# Patient Record
Sex: Male | Born: 1965 | Race: White | Hispanic: No | Marital: Single
Health system: Midwestern US, Community
[De-identification: ages and names within clinical notes are randomized; demographics above are authoritative.]

## PROBLEM LIST (undated history)

## (undated) DIAGNOSIS — D649 Anemia, unspecified: Secondary | ICD-10-CM

## (undated) DIAGNOSIS — I509 Heart failure, unspecified: Secondary | ICD-10-CM

## (undated) DIAGNOSIS — K5090 Crohn's disease, unspecified, without complications: Secondary | ICD-10-CM

## (undated) DIAGNOSIS — E119 Type 2 diabetes mellitus without complications: Secondary | ICD-10-CM

## (undated) DIAGNOSIS — N189 Chronic kidney disease, unspecified: Secondary | ICD-10-CM

## (undated) DIAGNOSIS — G4730 Sleep apnea, unspecified: Secondary | ICD-10-CM

## (undated) DIAGNOSIS — G35D Multiple sclerosis, unspecified: Secondary | ICD-10-CM

## (undated) DIAGNOSIS — I10 Essential (primary) hypertension: Secondary | ICD-10-CM

## (undated) DIAGNOSIS — C801 Malignant (primary) neoplasm, unspecified: Secondary | ICD-10-CM

## (undated) DIAGNOSIS — J45909 Unspecified asthma, uncomplicated: Secondary | ICD-10-CM

## (undated) DIAGNOSIS — G35 Multiple sclerosis: Secondary | ICD-10-CM

## (undated) DIAGNOSIS — M069 Rheumatoid arthritis, unspecified: Secondary | ICD-10-CM

## (undated) DIAGNOSIS — M1990 Unspecified osteoarthritis, unspecified site: Secondary | ICD-10-CM

## (undated) HISTORY — PX: HERNIA REPAIR: SHX51

## (undated) HISTORY — PX: APPENDECTOMY: SHX54

## (undated) HISTORY — PX: SMALL BOWEL REPAIR: SHX6447

## (undated) HISTORY — PX: OTHER SURGICAL HISTORY: SHX169

---

## 2003-08-11 ENCOUNTER — Other Ambulatory Visit: Payer: Self-pay

## 2007-05-02 HISTORY — PX: HERNIA REPAIR: SHX51

## 2008-07-27 ENCOUNTER — Emergency Department: Payer: Self-pay | Admitting: Emergency Medicine

## 2011-07-17 LAB — COMPREHENSIVE METABOLIC PANEL
Albumin: 3.7 g/dL (ref 3.4–5.0)
Alkaline Phosphatase: 48 U/L — ABNORMAL LOW (ref 50–136)
Anion Gap: 16 (ref 7–16)
Calcium, Total: 9 mg/dL (ref 8.5–10.1)
Creatinine: 1.14 mg/dL (ref 0.60–1.30)
EGFR (Non-African Amer.): >60
Glucose: 104 mg/dL — ABNORMAL HIGH (ref 65–99)
Osmolality: 287 (ref 275–301)
Potassium: 3.8 mmol/L (ref 3.5–5.1)
SGPT (ALT): 40 U/L
Sodium: 143 mmol/L (ref 136–145)

## 2011-07-17 LAB — URINALYSIS, COMPLETE
Bilirubin,UR: NEGATIVE
Blood: NEGATIVE
Glucose,UR: NEGATIVE mg/dL (ref 0–75)
Leukocyte Esterase: NEGATIVE
Ph: 5 (ref 4.5–8.0)
Protein: 100
Specific Gravity: 1.04 (ref 1.003–1.030)
Squamous Epithelial: <1
WBC UR: 2 /HPF (ref 0–5)

## 2011-07-17 LAB — CBC
HCT: 43.8 % (ref 40.0–52.0)
MCH: 26.5 pg (ref 26.0–34.0)
Platelet: 296 10*3/uL (ref 150–440)
WBC: 15 10*3/uL — ABNORMAL HIGH (ref 3.8–10.6)

## 2011-07-17 LAB — LIPASE, BLOOD: Lipase: 199 U/L (ref 73–393)

## 2011-07-18 ENCOUNTER — Inpatient Hospital Stay: Payer: Self-pay | Admitting: Student

## 2011-07-19 LAB — BASIC METABOLIC PANEL
Anion Gap: 13 (ref 7–16)
Calcium, Total: 8.7 mg/dL (ref 8.5–10.1)
Chloride: 104 mmol/L (ref 98–107)
Co2: 24 mmol/L (ref 21–32)
Creatinine: 0.78 mg/dL (ref 0.60–1.30)
EGFR (African American): >60
Osmolality: 284 (ref 275–301)
Sodium: 141 mmol/L (ref 136–145)

## 2011-07-19 LAB — CBC WITH DIFFERENTIAL/PLATELET
Basophil %: 0.2 %
Eosinophil #: 0 10*3/uL (ref 0.0–0.7)
Eosinophil %: 0 %
HCT: 40.6 % (ref 40.0–52.0)
HGB: 13.4 g/dL (ref 13.0–18.0)
Lymphocyte %: 9.9 %
MCHC: 33.1 g/dL (ref 32.0–36.0)
Monocyte %: 5.3 %
Neutrophil #: 10.6 10*3/uL — ABNORMAL HIGH (ref 1.4–6.5)
Neutrophil %: 84.6 %
RBC: 5.07 10*6/uL (ref 4.40–5.90)
WBC: 12.5 10*3/uL — ABNORMAL HIGH (ref 3.8–10.6)

## 2011-07-19 LAB — CLOSTRIDIUM DIFFICILE BY PCR

## 2011-07-19 LAB — WBCS, STOOL

## 2011-07-20 LAB — CBC WITH DIFFERENTIAL/PLATELET
Basophil #: 0 10*3/uL (ref 0.0–0.1)
Basophil %: 0.2 %
Eosinophil %: 0 %
HCT: 38.9 % — ABNORMAL LOW (ref 40.0–52.0)
HGB: 12.9 g/dL — ABNORMAL LOW (ref 13.0–18.0)
Lymphocyte #: 1.1 10*3/uL (ref 1.0–3.6)
Lymphocyte %: 9.7 %
MCH: 26.5 pg (ref 26.0–34.0)
MCHC: 33.1 g/dL (ref 32.0–36.0)
MCV: 80 fL (ref 80–100)
Monocyte #: 0.5 10*3/uL (ref 0.0–0.7)
Monocyte %: 4.5 %
Neutrophil #: 9.8 10*3/uL — ABNORMAL HIGH (ref 1.4–6.5)
RBC: 4.87 10*6/uL (ref 4.40–5.90)
WBC: 11.5 10*3/uL — ABNORMAL HIGH (ref 3.8–10.6)

## 2011-07-23 ENCOUNTER — Emergency Department: Payer: Self-pay | Admitting: Unknown Physician Specialty

## 2011-07-23 LAB — URINALYSIS, COMPLETE
Blood: NEGATIVE
Ketone: NEGATIVE
Nitrite: NEGATIVE
Protein: NEGATIVE
Squamous Epithelial: <1

## 2011-07-23 LAB — COMPREHENSIVE METABOLIC PANEL
Albumin: 3.4 g/dL (ref 3.4–5.0)
Anion Gap: 12 (ref 7–16)
BUN: 17 mg/dL (ref 7–18)
Calcium, Total: 8.4 mg/dL — ABNORMAL LOW (ref 8.5–10.1)
Co2: 25 mmol/L (ref 21–32)
EGFR (Non-African Amer.): >60
Glucose: 112 mg/dL — ABNORMAL HIGH (ref 65–99)
Osmolality: 289 (ref 275–301)
Potassium: 4.2 mmol/L (ref 3.5–5.1)
SGOT(AST): 35 U/L (ref 15–37)
SGPT (ALT): 58 U/L
Total Protein: 6.8 g/dL (ref 6.4–8.2)

## 2011-07-23 LAB — CBC
HCT: 42.9 % (ref 40.0–52.0)
HGB: 14.4 g/dL (ref 13.0–18.0)
MCH: 26.7 pg (ref 26.0–34.0)
MCHC: 33.4 g/dL (ref 32.0–36.0)
MCV: 80 fL (ref 80–100)
RDW: 16.3 % — ABNORMAL HIGH (ref 11.5–14.5)

## 2011-07-23 LAB — LIPASE, BLOOD: Lipase: 345 U/L (ref 73–393)

## 2011-07-24 ENCOUNTER — Emergency Department: Payer: Self-pay | Admitting: *Deleted

## 2011-07-24 LAB — CBC
HCT: 44.6 % (ref 40.0–52.0)
MCH: 26.5 pg (ref 26.0–34.0)
MCHC: 32.9 g/dL (ref 32.0–36.0)
MCV: 81 fL (ref 80–100)
RBC: 5.55 10*6/uL (ref 4.40–5.90)
WBC: 14.8 10*3/uL — ABNORMAL HIGH (ref 3.8–10.6)

## 2011-07-24 LAB — COMPREHENSIVE METABOLIC PANEL
Albumin: 3.2 g/dL — ABNORMAL LOW (ref 3.4–5.0)
BUN: 19 mg/dL — ABNORMAL HIGH (ref 7–18)
Bilirubin,Total: 0.4 mg/dL (ref 0.2–1.0)
Calcium, Total: 8.4 mg/dL — ABNORMAL LOW (ref 8.5–10.1)
Chloride: 107 mmol/L (ref 98–107)
Co2: 26 mmol/L (ref 21–32)
EGFR (Non-African Amer.): >60
Glucose: 112 mg/dL — ABNORMAL HIGH (ref 65–99)
Osmolality: 288 (ref 275–301)
Potassium: 3.5 mmol/L (ref 3.5–5.1)
SGOT(AST): 24 U/L (ref 15–37)
SGPT (ALT): 57 U/L
Total Protein: 6.3 g/dL — ABNORMAL LOW (ref 6.4–8.2)

## 2011-07-24 LAB — LIPASE, BLOOD: Lipase: 320 U/L (ref 73–393)

## 2014-02-19 LAB — CBC WITH DIFFERENTIAL/PLATELET
Basophil #: 0.1 10*3/uL (ref 0.0–0.1)
Basophil %: 0.7 %
EOS ABS: 0.3 10*3/uL (ref 0.0–0.7)
Eosinophil %: 2.8 %
HCT: 41.8 % (ref 40.0–52.0)
HGB: 13.6 g/dL (ref 13.0–18.0)
Lymphocyte #: 1.8 10*3/uL (ref 1.0–3.6)
Lymphocyte %: 19 %
MCH: 25.3 pg — AB (ref 26.0–34.0)
MCHC: 32.4 g/dL (ref 32.0–36.0)
MCV: 78 fL — AB (ref 80–100)
Monocyte #: 0.6 x10 3/mm (ref 0.2–1.0)
Monocyte %: 6.6 %
NEUTROS PCT: 70.9 %
Neutrophil #: 6.9 10*3/uL — ABNORMAL HIGH (ref 1.4–6.5)
Platelet: 208 10*3/uL (ref 150–440)
RBC: 5.36 10*6/uL (ref 4.40–5.90)
RDW: 17.3 % — AB (ref 11.5–14.5)
WBC: 9.8 10*3/uL (ref 3.8–10.6)

## 2014-02-19 LAB — COMPREHENSIVE METABOLIC PANEL
ALK PHOS: 48 U/L
AST: 14 U/L — AB (ref 15–37)
Albumin: 3.3 g/dL — ABNORMAL LOW (ref 3.4–5.0)
Anion Gap: 8 (ref 7–16)
BUN: 18 mg/dL (ref 7–18)
Bilirubin,Total: 0.2 mg/dL (ref 0.2–1.0)
CO2: 25 mmol/L (ref 21–32)
Calcium, Total: 8.9 mg/dL (ref 8.5–10.1)
Chloride: 109 mmol/L — ABNORMAL HIGH (ref 98–107)
Creatinine: 1.13 mg/dL (ref 0.60–1.30)
Glucose: 114 mg/dL — ABNORMAL HIGH (ref 65–99)
Osmolality: 286 (ref 275–301)
Potassium: 3.8 mmol/L (ref 3.5–5.1)
SGPT (ALT): 25 U/L
Sodium: 142 mmol/L (ref 136–145)
Total Protein: 6.6 g/dL (ref 6.4–8.2)

## 2014-02-19 LAB — TROPONIN I: Troponin-I: <0.02 ng/mL

## 2014-02-19 LAB — LIPASE, BLOOD: LIPASE: 335 U/L (ref 73–393)

## 2014-02-20 ENCOUNTER — Observation Stay: Payer: Self-pay | Admitting: Internal Medicine

## 2014-02-21 LAB — CBC WITH DIFFERENTIAL/PLATELET
Basophil #: 0.1 10*3/uL (ref 0.0–0.1)
Basophil %: 1 %
Eosinophil #: 0.1 10*3/uL (ref 0.0–0.7)
Eosinophil %: 0.9 %
HCT: 40.2 % (ref 40.0–52.0)
HGB: 12.7 g/dL — ABNORMAL LOW (ref 13.0–18.0)
Lymphocyte #: 1.5 10*3/uL (ref 1.0–3.6)
Lymphocyte %: 19.8 %
MCH: 25.2 pg — ABNORMAL LOW (ref 26.0–34.0)
MCHC: 31.6 g/dL — ABNORMAL LOW (ref 32.0–36.0)
MCV: 80 fL (ref 80–100)
Monocyte #: 0.6 x10 3/mm (ref 0.2–1.0)
Monocyte %: 7.8 %
Neutrophil #: 5.3 10*3/uL (ref 1.4–6.5)
Neutrophil %: 70.5 %
Platelet: 181 10*3/uL (ref 150–440)
RBC: 5.04 10*6/uL (ref 4.40–5.90)
RDW: 17.2 % — ABNORMAL HIGH (ref 11.5–14.5)
WBC: 7.5 10*3/uL (ref 3.8–10.6)

## 2014-02-21 LAB — TSH: Thyroid Stimulating Horm: 1.17 u[IU]/mL

## 2014-04-02 ENCOUNTER — Ambulatory Visit: Payer: Self-pay | Admitting: Neurology

## 2014-04-06 ENCOUNTER — Ambulatory Visit: Payer: Self-pay | Admitting: Neurology

## 2014-04-07 ENCOUNTER — Observation Stay: Payer: Self-pay | Admitting: Specialist

## 2014-04-07 LAB — COMPREHENSIVE METABOLIC PANEL
ALT: 24 U/L
ANION GAP: 8 (ref 7–16)
Albumin: 3.2 g/dL — ABNORMAL LOW (ref 3.4–5.0)
Alkaline Phosphatase: 39 U/L — ABNORMAL LOW
BUN: 14 mg/dL (ref 7–18)
Bilirubin,Total: 0.3 mg/dL (ref 0.2–1.0)
Calcium, Total: 8.5 mg/dL (ref 8.5–10.1)
Chloride: 105 mmol/L (ref 98–107)
Co2: 26 mmol/L (ref 21–32)
Creatinine: 1.06 mg/dL (ref 0.60–1.30)
EGFR (African American): >60
Glucose: 88 mg/dL (ref 65–99)
OSMOLALITY: 277 (ref 275–301)
POTASSIUM: 3.5 mmol/L (ref 3.5–5.1)
SGOT(AST): 14 U/L — ABNORMAL LOW (ref 15–37)
SODIUM: 139 mmol/L (ref 136–145)
Total Protein: 6.4 g/dL (ref 6.4–8.2)

## 2014-04-07 LAB — CBC WITH DIFFERENTIAL/PLATELET
Basophil #: 0.1 10*3/uL (ref 0.0–0.1)
Basophil %: 0.7 %
EOS ABS: 0.1 10*3/uL (ref 0.0–0.7)
EOS PCT: 2 %
HCT: 40.6 % (ref 40.0–52.0)
HGB: 13.5 g/dL (ref 13.0–18.0)
LYMPHS ABS: 1.5 10*3/uL (ref 1.0–3.6)
Lymphocyte %: 20.2 %
MCH: 27.1 pg (ref 26.0–34.0)
MCHC: 33.2 g/dL (ref 32.0–36.0)
MCV: 82 fL (ref 80–100)
MONOS PCT: 9.6 %
Monocyte #: 0.7 x10 3/mm (ref 0.2–1.0)
NEUTROS PCT: 67.5 %
Neutrophil #: 4.9 10*3/uL (ref 1.4–6.5)
Platelet: 184 10*3/uL (ref 150–440)
RBC: 4.97 10*6/uL (ref 4.40–5.90)
RDW: 16.4 % — AB (ref 11.5–14.5)
WBC: 7.2 10*3/uL (ref 3.8–10.6)

## 2014-04-07 LAB — URINALYSIS, COMPLETE
BACTERIA: NONE SEEN
Bilirubin,UR: NEGATIVE
Blood: NEGATIVE
Glucose,UR: NEGATIVE mg/dL (ref 0–75)
Ketone: NEGATIVE
Leukocyte Esterase: NEGATIVE
Nitrite: NEGATIVE
Ph: 6 (ref 4.5–8.0)
Protein: NEGATIVE
RBC,UR: 1 /HPF (ref 0–5)
SPECIFIC GRAVITY: 1.01 (ref 1.003–1.030)
Squamous Epithelial: NONE SEEN
WBC UR: NONE SEEN /HPF (ref 0–5)

## 2014-04-07 LAB — PROTIME-INR
INR: 1
Prothrombin Time: 12.7 secs (ref 11.5–14.7)

## 2014-04-07 LAB — APTT: Activated PTT: 33.7 secs (ref 23.6–35.9)

## 2014-04-07 LAB — LIPASE, BLOOD: Lipase: 197 U/L (ref 73–393)

## 2014-04-08 LAB — CBC WITH DIFFERENTIAL/PLATELET
BASOS ABS: 0 10*3/uL (ref 0.0–0.1)
Basophil %: 0.4 %
EOS PCT: 0.7 %
Eosinophil #: 0.1 10*3/uL (ref 0.0–0.7)
HCT: 42.8 % (ref 40.0–52.0)
HGB: 14 g/dL (ref 13.0–18.0)
Lymphocyte #: 1.3 10*3/uL (ref 1.0–3.6)
Lymphocyte %: 15 %
MCH: 27.1 pg (ref 26.0–34.0)
MCHC: 32.7 g/dL (ref 32.0–36.0)
MCV: 83 fL (ref 80–100)
Monocyte #: 0.8 x10 3/mm (ref 0.2–1.0)
Monocyte %: 9 %
Neutrophil #: 6.6 10*3/uL — ABNORMAL HIGH (ref 1.4–6.5)
Neutrophil %: 74.9 %
PLATELETS: 176 10*3/uL (ref 150–440)
RBC: 5.16 10*6/uL (ref 4.40–5.90)
RDW: 16.5 % — AB (ref 11.5–14.5)
WBC: 8.8 10*3/uL (ref 3.8–10.6)

## 2014-04-08 LAB — COMPREHENSIVE METABOLIC PANEL
ANION GAP: 6 — AB (ref 7–16)
Albumin: 3.1 g/dL — ABNORMAL LOW (ref 3.4–5.0)
Alkaline Phosphatase: 38 U/L — ABNORMAL LOW
BILIRUBIN TOTAL: 0.5 mg/dL (ref 0.2–1.0)
BUN: 14 mg/dL (ref 7–18)
CALCIUM: 8.6 mg/dL (ref 8.5–10.1)
CO2: 22 mmol/L (ref 21–32)
CREATININE: 0.89 mg/dL (ref 0.60–1.30)
Chloride: 108 mmol/L — ABNORMAL HIGH (ref 98–107)
Glucose: 121 mg/dL — ABNORMAL HIGH (ref 65–99)
Osmolality: 274 (ref 275–301)
Potassium: 4.9 mmol/L (ref 3.5–5.1)
SGOT(AST): 16 U/L (ref 15–37)
SGPT (ALT): 26 U/L
Sodium: 136 mmol/L (ref 136–145)
Total Protein: 6.4 g/dL (ref 6.4–8.2)

## 2014-04-09 LAB — CBC WITH DIFFERENTIAL/PLATELET
BASOS PCT: 0.2 %
Basophil #: 0 10*3/uL (ref 0.0–0.1)
EOS ABS: 0 10*3/uL (ref 0.0–0.7)
EOS PCT: 0.2 %
HCT: 40.4 % (ref 40.0–52.0)
HGB: 13.3 g/dL (ref 13.0–18.0)
LYMPHS PCT: 8.2 %
Lymphocyte #: 0.6 10*3/uL — ABNORMAL LOW (ref 1.0–3.6)
MCH: 26.7 pg (ref 26.0–34.0)
MCHC: 32.8 g/dL (ref 32.0–36.0)
MCV: 81 fL (ref 80–100)
Monocyte #: 0.2 x10 3/mm (ref 0.2–1.0)
Monocyte %: 2.9 %
Neutrophil #: 6.9 10*3/uL — ABNORMAL HIGH (ref 1.4–6.5)
Neutrophil %: 88.5 %
Platelet: 175 10*3/uL (ref 150–440)
RBC: 4.97 10*6/uL (ref 4.40–5.90)
RDW: 16.4 % — ABNORMAL HIGH (ref 11.5–14.5)
WBC: 7.8 10*3/uL (ref 3.8–10.6)

## 2014-05-06 ENCOUNTER — Emergency Department: Payer: Self-pay | Admitting: Emergency Medicine

## 2014-05-06 LAB — COMPREHENSIVE METABOLIC PANEL
ALK PHOS: 40 U/L — AB
Albumin: 3.2 g/dL — ABNORMAL LOW (ref 3.4–5.0)
Anion Gap: 5 — ABNORMAL LOW (ref 7–16)
BUN: 11 mg/dL (ref 7–18)
Bilirubin,Total: 0.2 mg/dL (ref 0.2–1.0)
Calcium, Total: 8.3 mg/dL — ABNORMAL LOW (ref 8.5–10.1)
Chloride: 107 mmol/L (ref 98–107)
Co2: 27 mmol/L (ref 21–32)
Creatinine: 1.21 mg/dL (ref 0.60–1.30)
EGFR (African American): >60
EGFR (Non-African Amer.): >60
GLUCOSE: 78 mg/dL (ref 65–99)
Osmolality: 276 (ref 275–301)
POTASSIUM: 4 mmol/L (ref 3.5–5.1)
SGOT(AST): 25 U/L (ref 15–37)
SGPT (ALT): 39 U/L
Sodium: 139 mmol/L (ref 136–145)
TOTAL PROTEIN: 6.4 g/dL (ref 6.4–8.2)

## 2014-05-06 LAB — CBC WITH DIFFERENTIAL/PLATELET
Basophil #: 0.1 10*3/uL (ref 0.0–0.1)
Basophil %: 0.7 %
Eosinophil #: 0.3 10*3/uL (ref 0.0–0.7)
Eosinophil %: 2.1 %
HCT: 41 % (ref 40.0–52.0)
HGB: 13.4 g/dL (ref 13.0–18.0)
LYMPHS ABS: 1.9 10*3/uL (ref 1.0–3.6)
Lymphocyte %: 15.8 %
MCH: 26.7 pg (ref 26.0–34.0)
MCHC: 32.7 g/dL (ref 32.0–36.0)
MCV: 82 fL (ref 80–100)
MONO ABS: 1.1 x10 3/mm — AB (ref 0.2–1.0)
MONOS PCT: 9 %
Neutrophil #: 8.7 10*3/uL — ABNORMAL HIGH (ref 1.4–6.5)
Neutrophil %: 72.4 %
PLATELETS: 238 10*3/uL (ref 150–440)
RBC: 5.02 10*6/uL (ref 4.40–5.90)
RDW: 15.3 % — ABNORMAL HIGH (ref 11.5–14.5)
WBC: 12 10*3/uL — ABNORMAL HIGH (ref 3.8–10.6)

## 2014-05-26 ENCOUNTER — Emergency Department: Payer: Self-pay | Admitting: Emergency Medicine

## 2014-05-26 LAB — COMPREHENSIVE METABOLIC PANEL
ALBUMIN: 2.9 g/dL — AB (ref 3.4–5.0)
ALK PHOS: 40 U/L — AB (ref 46–116)
ANION GAP: 5 — AB (ref 7–16)
BUN: 16 mg/dL (ref 7–18)
Bilirubin,Total: 0.2 mg/dL (ref 0.2–1.0)
CO2: 24 mmol/L (ref 21–32)
Calcium, Total: 8.5 mg/dL (ref 8.5–10.1)
Chloride: 111 mmol/L — ABNORMAL HIGH (ref 98–107)
Creatinine: 1.33 mg/dL — ABNORMAL HIGH (ref 0.60–1.30)
EGFR (African American): >60
EGFR (Non-African Amer.): >60
GLUCOSE: 91 mg/dL (ref 65–99)
OSMOLALITY: 280 (ref 275–301)
POTASSIUM: 3.8 mmol/L (ref 3.5–5.1)
SGOT(AST): 17 U/L (ref 15–37)
SGPT (ALT): 19 U/L (ref 14–63)
SODIUM: 140 mmol/L (ref 136–145)
Total Protein: 5.9 g/dL — ABNORMAL LOW (ref 6.4–8.2)

## 2014-05-26 LAB — URINALYSIS, COMPLETE
BACTERIA: NONE SEEN
BILIRUBIN, UR: NEGATIVE
BLOOD: NEGATIVE
Glucose,UR: NEGATIVE mg/dL (ref 0–75)
Hyaline Cast: 2
KETONE: NEGATIVE
Leukocyte Esterase: NEGATIVE
Nitrite: NEGATIVE
PROTEIN: NEGATIVE
Ph: 6 (ref 4.5–8.0)
SQUAMOUS EPITHELIAL: NONE SEEN
Specific Gravity: 1.023 (ref 1.003–1.030)

## 2014-05-26 LAB — CBC
HCT: 40.8 % (ref 40.0–52.0)
HGB: 13.3 g/dL (ref 13.0–18.0)
MCH: 26.5 pg (ref 26.0–34.0)
MCHC: 32.4 g/dL (ref 32.0–36.0)
MCV: 82 fL (ref 80–100)
PLATELETS: 187 10*3/uL (ref 150–440)
RBC: 5.01 10*6/uL (ref 4.40–5.90)
RDW: 14.8 % — ABNORMAL HIGH (ref 11.5–14.5)
WBC: 8.8 10*3/uL (ref 3.8–10.6)

## 2014-05-26 LAB — LIPASE, BLOOD: Lipase: 227 U/L (ref 73–393)

## 2014-07-30 ENCOUNTER — Ambulatory Visit
Admit: 2014-07-30 | Disposition: A | Discharge status: Home / Self Care | Payer: Self-pay | Attending: Nephrology | Admitting: Nephrology

## 2014-08-22 NOTE — Discharge Summary (Signed)
PATIENT NAME:  Walter Diaz, Walter Diaz MR#:  [id number] DATE OF BIRTH:  12-24-1965  DATE OF ADMISSION:  02/20/2014 DATE OF DISCHARGE:  02/21/2014  DISCHARGE DIAGNOSES: 1.  Crohn's disease flare.  2.  Chronic pain syndrome.  3.  Depression.   DISCHARGE MEDICATIONS:  1.  Phenergan 25 mg orally 4 times a day as needed.  2.  Copaxone 40 mg per mL subcutaneous on Monday, Wednesday, Friday.  3.  OxyContin 20 mg oral every 12 hours.  4.  Oxycodone 20 mg every 6 hours as needed for pain.  5.  Lyrica 75 mg oral 2 times a day.  6.  Nexium 40 mg oral once a day.  7.  Adderall 10 mg oral 2 times a day.  8.  Prozac 20 mg oral 2 times a day.  9.  Entyvio 300 mg intravenous once a month.  10.  Percocet 10/325 one tablet every 4 hours as needed for pain.  11  Prednisone 40 mg daily for a week and then tapered over the next 3 weeks.  12.  Ciprofloxacin 5 mg oral 2 times a day for 1 week.  13.  Flagyl 500 mg oral 3 times a day for 1 week.   DISCHARGE INSTRUCTIONS:  Regular diet. Activity as tolerated. Follow up with [facility], GI, in 1-2 days where he has an appointment.  IMAGING STUDIES DONE: Include a CT scan of the abdomen and pelvis with contrast, showed mild bowel wall thickening and adjacent mesenteric stranding of the distal small bowel, compatible with mild inflammatory bowel disease flare. Fluid throughout the colon.  No constipation, no obstruction.    CONSULTATIONS:  Arther Dames, MD, with GI.  ADMITTING HISTORY AND PHYSICAL: Please see detailed H and P dictated previously by Dr. Marcille Blanco. In brief, a 49 year old Caucasian male patient with a long history of Crohn's disease, who follows at [facility] with the Inflammatory Bowel Disease Clinic. Presented to the Emergency Room complaining of worsening abdominal pain and vomiting. The patient was admitted for the treatment of Crohn's disease flare.   HOSPITAL COURSE: The patient was started on prednisone, initially n.p.o., later  transitioned to full diet. Was seen by GI who suggested tapering off prednisone slowly over a month. The patient is to follow up with [place] from where he will be referred to either [facility] or [facility] per Dr. Rayann Heman of GI.   The patient on the day of discharge has tolerated his food well. No diarrhea. No blood. Feels pain is well controlled with Percocet and is being discharged home in a stable condition. Prior to discharge, the patient has no tenderness on abdominal examination, bowel sounds present.  Lungs sound clear. S1, S2 heard. The patient's other comorbidities have been stable during the hospital stay.   TIME SPENT ON DAY OF DISCHARGE IN DISCHARGE ACTIVITY: 40 minutes.    ____________________________ Walter Alf Jaria Conway, MD srs:LT D: 02/23/2014 13:47:55 ET T: 02/23/2014 21:52:02 ET JOB#: [id number]  cc: Alveta Heimlich R. Yohann Curl, MD, <Dictator> Neita Carp MD ELECTRONICALLY SIGNED 03/05/2014 14:55

## 2014-08-22 NOTE — Consult Note (Signed)
PATIENT NAME:  Walter Diaz, Walter Diaz MR#:  [id number] DATE OF BIRTH:  14-Apr-1966  DATE OF CONSULTATION:  02/20/2014  REFERRING PHYSICIAN:  Rosilyn Mings, MD  CONSULTING PHYSICIAN:  Arther Dames, MD  REASON FOR CONSULTATION: Crohn's flare.   HISTORY OF PRESENT ILLNESS: Walter Diaz is a 49 year old male with a very complicated Crohn's history, including multiple resections, also what sounds like a demyelinating reaction to Humira and recent failure to respond to natalizumab, who is presenting to the Emergency Room for evaluation of diarrhea and abdominal pain.   Walter Diaz has long-standing history of Crohn's disease and was previously followed by Dr. Phineas Douglas at the [facility]. It sounds as though he has failed multiple therapies and also has had a recent colonoscopy in [facility] showing active disease in his colon and small intestine. This is per the patient as I do not have records this time.   He reports that he always has trouble with abdominal pain but over the past several days, had some worsening in his frequency of stool and also had increasing abdominal pain. He also had some subjective fevers.   In the Emergency Room, he had a CT scan which did show some distal small bowel thickening and stranding near his anastomosis. He also had evidence of some thickening and inflammation in his distal rectum.   Since he has been in the hospital he has received prednisone and antibiotics and actually is feeling significantly improved at this time.   PAST MEDICAL HISTORY:  1. Crohn's disease, status post multiple small bowel resections.  2. Multiple sclerosis.  3. Rheumatoid arthritis.  4. Obstructive sleep apnea.  5. GERD.   PAST SURGICAL HISTORY: Multiple small bowel resections. He also apparently had a surgical repair of a Humira injection site.   FAMILY HISTORY: He does report multiple family members with Crohn's disease and also a father with colon cancer.   SOCIAL HISTORY:  He denies alcohol or tobacco to me.   MEDICATIONS:  1. Percocet 5/325 1 tab every 6 hours.  2. Phenergan 25 mg as needed.  3. Tysabri (has not been taking recently).   ALLERGIES: MORPHINE.   PHYSICAL EXAMINATION:  VITAL SIGNS: Temperature is 98.1, pulse is 63, respirations are 17, blood pressure 142/91. Pulse oximetry is 97% on room air.  GENERAL: Alert and oriented times 4.  No acute distress. Appears stated age. HEENT: Normocephalic/atraumatic. Extraocular movements are intact. Anicteric. NECK: Soft, supple. JVP appears normal. No adenopathy. CHEST: Clear to auscultation. No wheeze or crackle. Respirations unlabored. HEART: Regular. No murmur, rub, or gallop.  Normal S1 and S2. ABDOMEN: Positive for multiple, multiple surgical scars.  He has some mild diffuse tenderness. Soft, nontender, nondistended.  Normal active bowel sounds in all 4 quadrants.  No organomegaly. No masses EXTREMITIES: No swelling, well perfused. SKIN: No rash or lesion. Skin color, texture, turgor normal. NEUROLOGICAL: Grossly intact. PSYCHIATRIC: Normal tone and affect. MUSCULOSKELETAL: No joint swelling or erythema.   LABORATORY DATA: His sodium is 142, potassium 3.8, BUN 18, creatinine 1.13. His lipase is 355, which is within normal limits. Liver enzymes are normal. His albumin is 3.3. Troponin is normal. His white count is 9.8, hemoglobin 13.6, hematocrit 42, platelets are 208,000.   For CT scan, see history of present illness.   ASSESSMENT AND PLAN:  Crohn's flare: He does have a very complicated Crohn's history, including multiple resections and also failure on multiple biologics. He was followed at [facility] with one of the leading IBD specialists, Dr. Lesly Rubenstein  Shen. Fortunately, he is doing better in the hospital on antibiotics and prednisone.   RECOMMENDATIONS:  1. It is okay to complete a course of Cipro and Flagyl.  2. Continue an extended prednisone taper until he is able to follow up with a  gastroenterologist as an outpatient. I would recommend starting this taper at 40 mg and extending a taper for over a month.  3. He has a scheduled follow up appointment at [facility]. However, given the severe complexity of his disease I think he would be much better served at an academic medical center. I recommended that he be seen in the IBD clinic at [facility]. He is in agreement with this recommendation. We will work on establishing this follow-up for him given his medical complexity and failure with multiple biologics to this point.   Thank you for this consult.    ____________________________ Arther Dames, MD mr:JT D: 02/20/2014 16:34:36 ET T: 02/20/2014 23:13:47 ET JOB#: [id number]  cc: Arther Dames, MD, <Dictator> Mellody Life MD ELECTRONICALLY SIGNED 03/24/2014 15:20

## 2014-08-22 NOTE — Discharge Summary (Signed)
PATIENT NAME:  Walter Diaz, Walter Diaz MR#:  [id number] DATE OF BIRTH:  [birth year]  DATE OF ADMISSION:  04/07/2014 DATE OF DISCHARGE:  04/09/2014  For a detailed note, please see the history and physical done on admission by Dr. Valentino Nose.   DIAGNOSES AT DISCHARGE: 1.  Suspected Crohn disease flare, right upper quadrant abdominal pain due to Crohn disease flare.  2.  History of multiple sclerosis, depression.   DIET: The patient is being discharged on a regular diet.   ACTIVITY: As tolerated.  FOLLOWUP: Followup is with Dr. Ellison Hughs in the next 1 to 2 weeks. Also follow up with the inflammatory bowel disease clinic at [facility] on December 21.   DISCHARGE MEDICATIONS: Phenergan 25 mg as needed for nausea and vomiting; Lyrica 75 mg b.i.d.; Nexium 40 mg daily; Adderall 10 mg b.i.d.; Prozac 20 mg b.i.d.;  Robitussin 10 mL q. 4 hours as needed; Copaxone 40 mg subcutaneous 3 times weekly; prednisone taper starting at 40 mg for a week, then 30 mg for a week, then 20 mg for a week, and then 10 mg for a week; Tylenol with oxycodone 5/325, 1 tab q. 4 hours as needed for pain; ciprofloxacin 250 mg b.i.d. x 7 days; and Flagyl 5 mg q. 8 hours x 7 days.   CONSULTANTS DURING HOSPITAL COURSE: Dr. Dorise Bullion from gastroenterology.   PERTINENT STUDIES DONE DURING THE HOSPITAL COURSE: Abdominal 3-way done on admission showing nonobstructive bowel gas pattern. No free air.   BRIEF HOSPITAL COURSE: This is a 49 year old male with medical problems as mentioned above who presented to the hospital due to right upper quadrant abdominal pain and some bloody diarrhea.  1.  Right upper quadrant abdominal pain with hematochezia. This was suspected to be secondary to a Crohn disease flare. The patient was empirically started on IV antibiotics with Cipro, Flagyl; placed on a clear liquid diet. A gastroenterology consult was obtained. The patient was seen by them, started on some IV steroids. After getting some steroids and some IV  antibiotics for about 24 to 48 hours, his clinical symptoms have improved. He has had no further bloody diarrhea. His abdominal pain is improved and resolved. Patient presently therefore is being discharged on a long prednisone taper along with oral antibiotics and to follow up at the inflammatory bowel disease clinic at [facility] coming up later this month.  2.  History of relapsing multiple sclerosis. The patient was maintained on his Copaxone. He will resume that.  3.  GERD. The patient was maintained on Protonix. He will resume that. 4.  Neuropathy. The patient was maintained on his Lyrica. He will resume that.  5.  A history of depression. The patient was maintained on his Prozac and he will also resume that upon discharge.   CODE STATUS: The patient is a full code.   TIME SPENT: 40 minutes.   ____________________________ Belia Heman. Verdell Carmine, MD vjs:ST D: 04/09/2014 15:51:00 ET T: 04/10/2014 02:55:56 ET JOB#: [id number]  cc: Belia Heman. Verdell Carmine, MD, <Dictator> Henreitta Leber MD ELECTRONICALLY SIGNED 04/17/2014 10:46

## 2014-08-22 NOTE — H&P (Signed)
PATIENT NAME:  Walter Diaz, Walter Diaz MR#:  [id number] DATE OF BIRTH:  October 14, 1965  DATE OF ADMISSION:  02/20/2014  REFERRING PHYSICIAN: Harvest Dark, MD  PRIMARY CARE PHYSICIAN: Nonlocal.   ADMISSION DIAGNOSIS: Crohn's disease exacerbation.   HISTORY OF PRESENT ILLNESS: This is a 49 year old Caucasian male who presents to the Emergency Department complaining of abdominal pain for approximately 5 days now. The pain is associated with nausea and vomiting. The patient has had nonbloody emesis at least 4 times today. He has also been having diarrhea that is nonbloody, but uncomfortable. He complains of some subjective fevers as well as some lightheadedness when standing. In the Emergency Department CAT scan of the abdomen revealed inflammation of the small bowel consistent with Crohn's disease, which prompted Emergency Department staff to call for admission.  REVIEW OF SYSTEMS:  CONSTITUTIONAL: The patient admits to chills and general malaise.  EYES: Denies blurred vision or inflammation.  EARS, NOSE AND THROAT: Denies tinnitus or difficulty swallowing.  RESPIRATORY: Denies cough or shortness of breath.  CARDIOVASCULAR: Denies chest pain or palpitations.  GASTROINTESTINAL: Admits to nausea, vomiting, diarrhea, and abdominal pain. He denies hematemesis or rectal bleeding.  GENITOURINARY: Denies dysuria, increased frequency or hesitancy.  ENDOCRINE: Denies polyuria or nocturia.  HEMATOLOGIC AND LYMPHATIC: Denies easy bleeding or bruising.  INTEGUMENT: Denies rashes or lesions.  MUSCULOSKELETAL: Admits to generalized aches and pains, but denies specific arthralgias or myalgias.  NEUROLOGIC: Denies numbness in his extremities or dysarthria.  PSYCHIATRIC: Denies depression or suicidal ideation.  PAST MEDICAL HISTORY: Crohn's disease, multiple sclerosis, rheumatoid arthritis, obstructive sleep apnea, gastroesophageal reflux disease and cholestasis.   PAST SURGICAL HISTORY: Small bowel resections x 4.  An appendectomy, incision and drainage Humira injection sites as well as revision of his abdominal incision closure due to infection.   FAMILY HISTORY: His father is deceased of colon cancer. His mother and sister both have Crohn's disease. There is diabetes, asthma, and arthritis throughout the family.   SOCIAL HISTORY: The patient is a former drinker, but he has been sober now for 7 years. He admits to chewing tobacco but chews less than 1 can per month. He also occasionally smokes a cigar and states that he does not smoke more than 3 per month.   MEDICATIONS:  1. Tysabri 300 mg per 15 mL injection, 1 injection intravenously per month.  2. Percocet 5 mg/325 mg 1 tablet p.o. every 6 hours as needed for pain.  3. Phenergan 25 mg 1 tablet as needed for vomiting.  4. Prednisone 50 mg 1 tablet daily p.o. daily.   ALLERGIES: MORPHINE.  PERTINENT LABORATORY RESULTS AND RADIOGRAPHIC FINDINGS: Glucose 114 BUN 18, creatinine 1.13, sodium 142, potassium 3.8, chloride 109, bicarbonate 25, calcium is 8.9. Lipase is 335, albumin is 3.3, alkaline phosphatase 48, AST 14, ALT 25. Troponin is negative. White blood cell count is 9.8, hemoglobin is 13.6, hematocrit 41.8, platelet count is 208. MCV is 78. CT of the abdomen and pelvis with contrast shows mild  bowel wall thickening and adjacent mesenteric stranding in the distal small bowel and also in the distal rectum compatible with a mild inflammatory bowel disease flare. There is fluid throughout the colon, but no free fluid or other inflammatory findings.   PHYSICAL EXAMINATION:  VITAL SIGNS: Temperature is 97.5, pulse 63, respirations 18, blood pressure 151/82, pulse oximetry is a 98% on room air.  GENERAL: The patient is alert and oriented in mild discomfort.  HEENT: Normocephalic, atraumatic. Pupils equal, round, and reactive to light and accommodation. Extraocular  movements are intact.  NECK: Trachea is midline. No adenopathy.  CHEST: Symmetric and  atraumatic.  CARDIOVASCULAR: Regular rate and rhythm. Normal S1, S2. No rubs, clicks, or murmurs.  LUNGS: Clear to auscultation bilaterally. Normal effort and excursion.  ABDOMEN: Positive bowel sounds. Soft. Diffusely tender without guarding or rebound. There is no hepatosplenomegaly. There are well-healed multiple incisions over his abdomen consistent with the areas of previously reported surgeries.  GENITOURINARY: Deferred.  MUSCULOSKELETAL: The patient moves all 4 extremities equally with upper and lower extremity strength 5/5 bilaterally.  SKIN: No rashes or lesions.  EXTREMITIES: No clubbing, cyanosis, or edema.  NEUROLOGIC: Cranial nerves II through XII are grossly intact.  PSYCHIATRIC: Mood is normal. Affect is congruent.   ASSESSMENT AND PLAN: This is a 48 year old man with a Crohn's flare.   1. Crohn's exacerbation. We will keep the patient n.p.o. except for medications. He will continue oral steroids and we have started Cipro and Flagyl. The patient has notably not had any of his antibiologic agents in 2 months. He had a GI appointment scheduled as an outpatient in the next few days, but we will obtain a GI consult from the same group while patient is hospitalized.  2. Autoimmune diseases (multiple sclerosis and rheumatoid arthritis). The patient had been receiving Humira and Tysabri infusions. He reports very little response to either but states that he had one  infusion of a newly approved monoclonal antibody with results that may be promising. I suggest potentially coordinating care with rheumatology. We also may want to increase his steroid dose and start a very extended taper.  3. Gastroesophageal reflux disease. Start pantoprazole.  4. Deep vein thrombosis prophylaxis. Heparin.  5. Gastrointestinal prophylaxis. Continue proton pump inhibitor as above.   CODE STATUS: The patient is a full code.   TIME SPENT ON ADMISSION ORDERS AND PATIENT CARE: Approximately 45 minutes.     ____________________________ Norva Riffle. Marcille Blanco, MD msd:JT D: 02/20/2014 03:56:24 ET T: 02/20/2014 05:21:56 ET JOB#: [id number]  cc: Norva Riffle. Marcille Blanco, MD, <Dictator> Norva Riffle Camara Rosander MD ELECTRONICALLY SIGNED 02/20/2014 7:44

## 2014-08-22 NOTE — H&P (Signed)
PATIENT NAME:  Walter Diaz, Walter Diaz MR#:  [id number] DATE OF BIRTH:  10-Mar-1966  DATE OF ADMISSION:  04/07/2014  REFERRING PHYSICIAN:  Dr. Thomasene Lot.   PRIMARY CARE PHYSICIAN:   [facility]  CHIEF COMPLAINT:  Abdominal pain and bright blood per rectum.   HISTORY OF PRESENT ILLNESS: This is a 49 year old Caucasian gentleman with history of Crohn's disease, multiple sclerosis, obstructive sleep apnea on CPAP therapy presenting with abdominal pain and bright blood per rectum. He describes 1 week duration of abdominal pain, right lower quadrant in location; dull, but sharp in quality, 10/10 in intensity, worse with p.o. intake.  No relieving factors.  Denies any associated fevers or chills. He, however, does mention associated bleeding per rectum mixed with stool, which has subsequently went on to frank bleeding 2 to 3 bouts daily.  Because of continuation of symptoms, he presented to the hospital for further workup and evaluation.    REVIEW OF SYSTEMS:   CONSTITUTIONAL: Denies any fevers, chills; however, positive for fatigue, weakness.  EYES: Denies blurred vision, double vision, or eye pain.  ENT: Denies tinnitus, ear pain or hearing loss.  RESPIRATORY: Denies cough, wheeze, shortness of breath.  CARDIOVASCULAR: Denies chest pain, palpitations, or edema.  GASTROINTESTINAL: Positive for abdominal pain as well as some hematochezia  as described above.  Denies any nausea, vomiting, diarrhea.   GENITOURINARY: Denies dysuria, hematuria.  ENDOCRINE: Denies nocturia or thyroid problems.  HEMATOLOGIC AND LYMPHATIC: Denies easy bruising, positive for bleeding as described above.  SKIN: Denies rashes or lesions.  MUSCULOSKELETAL: Denies pain in neck, back, shoulder, knee, hip pain, further neurologic symptoms NEUROLOGIC: Denies paralysis, paresthesias.  PSYCHIATRIC: Denies anxiety or depressive symptoms.  Otherwise, full review of systems is performed and is negative.   PAST MEDICAL HISTORY:  Significant  for Crohn's disease, as well as gastroesophageal reflux disease, history of obstructive sleep apnea on CPAP therapy as well as multiple sclerosis, relapsing remitting subtype.   SOCIAL HISTORY: Denies any alcohol, tobacco, or drug usage.    FAMILY HISTORY:  Positive for colon cancer as well as Crohn's.    ALLERGIES: MORPHINE, WHICH CAUSES ITCHING AND HIVES.   HOME MEDICATIONS: Include prednisone 10 mg p.o. daily, Lyrica 75 mg p.o. b.i.d., Prozac 20 mg p.o. b.i.d., Phenergan 25 mg p.o. as needed for nausea and vomiting, , Copaxone 40 mg subcutaneous 3 times weekly, Nexium 40 mg p.o. daily.   PHYSICAL EXAMINATION:  VITAL SIGNS: Temperature of 98.2, heart rate 94, respirations 20, blood pressure 146/97, saturating 96% on room air. Weight 90.7 kg, BMI 27 GENERAL:  This is a well-nourished, well-developed, Caucasian gentleman, currently in minimal distress given abdominal pain.  HEAD: Normocephalic, atraumatic.  EYES: Pupils equal, round, and reactive to light. Extraocular muscles intact. No scleral icterus. MOUTH: dry mucosal membrane. Dentition intact. No abscess noted.  EARS, NOSE, AND THROAT: Clear, without exudates. No external lesions.  NECK: Supple. No thyromegaly. No nodules. No JVD.  PULMONARY: Clear to auscultation bilaterally without wheezes, rales or rhonchi. No use of accessory muscles. Good respiratory effort.  CHEST: Nontender to palpation.  CARDIOVASCULAR: S1, S2. Regular rate and rhythm. No murmurs, rubs, or gallops. No edema. Pedal pulses 2+ bilaterally.  GASTROINTESTINAL: Soft. Minimal tenderness right lower quadrant and suprapubic region without rebound or guarding. No motion tenderness. Nondistended. Positive bowel sounds. No hepatosplenomegaly.  MUSCULOSKELETAL: No swelling, clubbing, or edema. Range of motion full in all extremities.  NEUROLOGIC: Cranial nerves II through XII intact. No gross focal neurological deficits. Sensation intact. Reflexes intact. SKIN: No  ulceration, lesions rashes or cyanosis. Skin warm, dry. Turgor intact.  PSYCHIATRIC: Mood and affect within normal limits. Patient awake, alert and oriented x3. Insight and judgment intact.   LABORATORY DATA: Sodium 139, potassium 3.5, chloride 105, bicarbonate 26, BUN 14, creatinine 1.06, glucose  88.  LFTs: Albumin of 3.2, alkaline phosphatase 39, AST 54; otherwise, within normal limits. WBC of 7.2, hemoglobin 13.5, platelets of 184,000.  Urinalysis negative for evidence of infection.   ASSESSMENT AND PLAN: This is a 49 year old Caucasian gentleman with history of Crohn's, multiple sclerosis, obstructive sleep apnea on constant positive airway pressure therapy presenting with abdominal pain with bright red blood per rectum.    1. Crohn's exacerbation, antibiotic coverage with  ciprofloxacin as well as Flagyl. We will consult gastroenterology. Continue his home doses of steroids and we will also add pain medications as required.  2. Hematochezia secondary to above. We will trend CBCs q.6 hours with transfusion threshold to hemoglobin less than 7. There is no indication for blood transfusion at this time.   3. gastroesophageal reflux disease without esophagitis continue with proton pump inhibitor therapy.  4. Obstructive sleep apnea, continue with constant positive airway pressure therapy at bedtime.  5. Multiple sclerosis. Continue with Copaxone 3 times weekly.   6. Deep venous thrombosis prophylaxis, will add sequential compression devices.   7. The patient is full code.   TIME SPENT: 45 minutes   ____________________________ Aaron Mose. Hower, MD dkh:by D: 04/07/2014 22:02:02 ET T: 04/07/2014 22:40:16 ET JOB#: [id number]  cc: Aaron Mose. Hower, MD, <Dictator> DAVID Woodfin Ganja MD ELECTRONICALLY SIGNED 04/08/2014 20:45

## 2014-08-23 NOTE — Consult Note (Signed)
Chief Complaint:   Subjective/Chief Complaint doing well, states ab pain is  about a 5/10, however states that he always has 2-3/10.  passing flatus and bm.  no blood.  no nausea, tolerating full liquids.   VITAL SIGNS/ANCILLARY NOTES: **Vital Signs.:   21-Mar-13 14:32   Vital Signs Type Routine   Temperature Temperature (F) 98.7   Celsius 37   Temperature Source oral   Pulse Pulse 84   Pulse source per Dinamap   Respirations Respirations 20   Systolic BP Systolic BP 979   Diastolic BP (mmHg) Diastolic BP (mmHg) 81   Mean BP 101   BP Source Dinamap   Pulse Ox % Pulse Ox % 95   Pulse Ox Activity Level  At rest   Oxygen Delivery Room Air/ 21 %   Brief Assessment:   Cardiac Regular    Respiratory clear BS    Gastrointestinal details normal Soft  Nondistended  No masses palpable  Bowel sounds normal  No rebound tenderness  minimal tendernes in the upper/medial llq     Routine Hem:  21-Mar-13 05:20    WBC (CBC) 11.5   RBC (CBC) 4.87   Hemoglobin (CBC) 12.9   Hematocrit (CBC) 38.9   Platelet Count (CBC) 229   MCV 80   MCH 26.5   MCHC 33.1   RDW 16.2   Neutrophil % 85.6   Lymphocyte % 9.7   Monocyte % 4.5   Eosinophil % 0.0   Basophil % 0.2   Neutrophil # 9.8   Lymphocyte # 1.1   Monocyte # 0.5   Eosinophil # 0.0   Basophil # 0.0   Radiology Results: CT:    18-Mar-13 21:08, CT Abdomen and Pelvis With Contrast   CT Abdomen and Pelvis With Contrast    REASON FOR EXAM:    (1) hx of sbo; abd pain and vomiting; (2) hx of sbo;   abd pain and vomiting  COMMENTS:       PROCEDURE: CT  - CT ABDOMEN / PELVIS  W  - Jul 17 2011  9:08PM     RESULT: History: Abdominal pain    Comparison:  None    Technique: Multiple axial images of the abdomen and pelvis were performed   from the lung bases to the pubic symphysis, with p.o. contrast and with   100 ml of Isovue 300 intravenous contrast.    Findings:  The lung bases are clear. There is no pneumothorax. The heart size  is   normal.     The liver demonstrates no focal abnormality. There is no intrahepatic or   extrahepatic biliary ductal dilatation. The gallbladder is unremarkable.   The spleen demonstrates no focal abnormality. The kidneys, adrenal   glands, and pancreas are normal. The bladder is unremarkable.     There multiple dilated loops of small bowel. There is mild small bowel   wall thickening involving the distal ileum just proximal to its   anastomosis with the transverse colon. There is evidence of prior right   hemicolectomy. Contrast is seen within the transverse colon. There is no   pneumoperitoneum, pneumatosis, or portal venous gas. There is no   abdominal or pelvic free fluid. There 2 pathologically enlarged prominent   right sided mesenteric lymph nodes.  The abdominal aorta is normal in caliber .    The osseous structures are unremarkable.    IMPRESSION:     Multiple mild dilated loops of small bowel with mild small bowel wall  thickening involving the distal ileum just proximal to the ileocolic   anastomosis. This may represent a enteritis secondary to an infectious or   inflammatory etiology versus a partial small bowel obstruction. A there   is no evidence of a complete small bowel obstruction as contrast is seen   in the transverse colon.          Verified By: Jennette Banker, M.D., MD   Assessment/Plan:  Assessment/Plan:   Assessment 1) crohns disease with multiple surgeries for same, on tysabri, for dual treatment of MS and Crohns.  partial sbo, improving, likely inflammatory narrowing related to recurrent crohns.    Plan 1) continue po prednisone at current dose , follow up with me as o/p this week, wed or thursday am. will begin taper at that time.  2) will advance diet to low res and lactose free.  3) finish 7 day course of abx 4) if tolerating low res diet, could d/c tomorrow.   Electronic Signatures: Loistine Simas (MD)  (Signed 21-Mar-13  17:58)  Authored: Chief Complaint, VITAL SIGNS/ANCILLARY NOTES, Brief Assessment, Lab Results, Radiology Results, Assessment/Plan   Last Updated: 21-Mar-13 17:58 by Loistine Simas (MD)

## 2014-08-23 NOTE — H&P (Signed)
PATIENT NAME:  Walter Diaz, Walter Diaz MR#:  [id number] DATE OF BIRTH:  1966-02-04  DATE OF ADMISSION:  07/18/2011  PRIMARY CARE PHYSICIAN: None. He just recently moved from [place].   CHIEF COMPLAINT: Abdominal pain.   HISTORY OF PRESENT ILLNESS: This is a 49 year old man who has been having abdominal pain since Thursday. He went to the urgent care. He was given prednisone, Phenergan and pain medication. His pain got worse. He is unable to keep anything down. He has been having hot and cold chills. He has also been having diarrhea since Monday, nausea, vomiting 20 times. No blood in the vomit. In the Emergency Room he had a CT scan of the abdomen and pelvis that showed multiple mild diluted loops of small bowel with mild bowel wall thickening involving the distal ileum just proximal to the ileocolic anastomosis. May represent enteritis secondary to infectious or inflammatory versus a partial small bowel obstruction. Hospitalist services were contacted for further evaluation.   PAST MEDICAL/SURGICAL HISTORY:  1. Crohn's disease. 2. Multiple sclerosis. 3. Anemia. 4. Gastroesophageal reflux disease. 5. Sleep apnea, wears CPAP at night.   6. Small bowel resection last in 2009.  7. Staph aureus. 8. Surgery and debridement on the abdomen. 9. Appendectomy.  10. Scar tissue removal. 11. Right knee surgery.   ALLERGIES: Morphine causes itching.   MEDICATIONS:  1. IV Tysabri once a month on the 28th. 2. Nexium 40 mg daily.  3. Phenergan p.r.n.  4. Prednisone taper recently given. 5. Percocet recently given.   SOCIAL HISTORY: No smoking. No alcohol in the past four years. Did smoke couple of hits of marijuana but that is a rare thing for him. He is on disability.   FAMILY HISTORY: Father died of colon cancer. Mother with Crohn's disease. Sister with Crohn's disease. Sister with asthma. Liver and lung cancer in the family also.   REVIEW OF SYSTEMS: CONSTITUTIONAL: Positive for hot and cold chills and  sweats. No fever. Positive for weight fluctuation over the past 25 years 25 pounds. Positive for fatigue. EYES: His right eye deviates and unable to move his right eye all the way up to the right. EARS, NOSE, MOUTH, AND THROAT: Positive for sinus pain. No sore throat. No difficulty swallowing. CARDIOVASCULAR: No chest pain. No palpitations. RESPIRATORY: Positive for cough. Occasional yellow phlegm. No shortness of breath. No sputum. No hemoptysis. GASTROINTESTINAL: Positive for nausea. Positive for vomiting. No hematemesis. Positive for abdominal pain. Positive for diarrhea. No bright red blood per rectum. No melena. GENITOURINARY: No burning on urination. No hematuria. MUSCULOSKELETAL: No joint pain or muscle pain. INTEGUMENT: No rashes or eruptions. NEUROLOGIC: No fainting currently but if he stands up quickly can faint easily. INTEGUMENT: No rashes. On the abdomen he does have two areas that have been healing that did have staph aureus infection. ENDOCRINE: No thyroid problems. HEMATOLOGIC/LYMPHATIC: Positive for anemia.   PHYSICAL EXAMINATION:  VITAL SIGNS: Temperature 97, pulse 84, respirations 18, blood pressure 155/79, pulse oximetry 96%.   GENERAL: No respiratory distress.   EYES: Conjunctivae and lids normal. Pupils equal, round, and reactive to light. Extraocular muscles intact. No nystagmus.   EARS, NOSE, MOUTH, AND THROAT: Tympanic membranes no erythema. Nasal mucosa no erythema. Throat no erythema. No exudate seen. Lips and gums negative.   NECK: No JVD. No bruits. No lymphadenopathy. No thyromegaly. No thyroid nodules palpated.   RESPIRATORY: Lungs clear to auscultation. No use of accessory muscles to breathe. No rhonchi, rales, or wheeze heard.   CARDIOVASCULAR: S1, S2 normal. No  gallops, rubs, murmurs heard. Carotid upstroke 2+ bilaterally. No bruits. Dorsalis pedis pulses 2+ bilaterally. No edema of the lower extremity.   ABDOMEN: Soft. Positive tenderness generalized throughout the  entire abdomen. Normoactive bowel sounds. No masses felt.   LYMPHATIC: No lymph nodes in the neck.   MUSCULOSKELETAL: No clubbing, edema, or cyanosis.   SKIN: On the right abdomen there is a scar there that healed from secondary intention from the inside out. There is another spot on the left lower abdomen that does have a scab on it. He says that it does drain intermittently.   MUSCULOSKELETAL: No clubbing, edema, or cyanosis.   NEUROLOGIC: Cranial nerves II through XII grossly intact. Deep tendon reflexes 2+ bilateral lower extremities.   PSYCHIATRIC: Patient is oriented to person, place, and time.   LABORATORY, DIAGNOSTIC, AND RADIOLOGICAL DATA: Urinalysis trace ketones. Chest x-ray showed nonobstructive gas bowel pattern. Chest radiograph negative. White blood cell count 15.0, hemoglobin and hematocrit 14.6 and 43.6, platelet count 296, glucose 103, BUN 18, creatinine 1.14, sodium 143, potassium 3.8, chloride 109, CO2 18, calcium 9.0. Liver function tests: Alkaline phosphatase 48, ALT 40, AST 26. CT scan of the abdomen showed multiple dilated loops of small bowel, small bowel wall thickening involving the distal ileum. May represent enteritis, infectious or inflammatory.   ASSESSMENT AND PLAN:  1. Severe abdominal pain with Crohn's flare, possible partial bowel obstruction with leukocytosis. Will get a GI consult in the a.m. Give IV fluid hydration. Keep n.p.o. P.R.N. IV Dilaudid, p.r.n. IV Zofran, IM Phenergan. Will give Cipro and Flagyl and Solu-Medrol. Will send off stool studies.  2. MS. He is on Tysabri.   3. Gastroesophageal reflux disease. IV Protonix.  4. Sleep apnea. CPAP at night.  5. Anemia. Blood counts are actually good, may be dehydrated.   TIME SPENT ON ADMISSION: 50 minutes.   ____________________________ Tana Conch. Leslye Peer, MD rjw:cms D: 07/18/2011 00:50:20 ET T: 07/18/2011 06:59:56 ET JOB#: [id number]  cc: Tana Conch. Leslye Peer, MD, <Dictator> Marisue Brooklyn  MD ELECTRONICALLY SIGNED 07/21/2011 18:16

## 2014-08-23 NOTE — Consult Note (Signed)
PATIENT NAME:  Walter Diaz, Walter Diaz MR#:  [id number] DATE OF BIRTH:  1966-03-10  DATE OF CONSULTATION:  07/17/2011  CHIEF COMPLAINT: Abdominal pain.   HISTORY OF PRESENT ILLNESS: I was called to the Emergency Room to see this patient a 49 year old Caucasian male patient with a long history of Crohn's disease and MS. He has had several days of nausea and abdominal pain and some vomiting. He is not able to keep down food at this point, but he is having some loose stools. Denies fevers or chills. The patient has a long-standing history of Crohn's disease, has not been on Asacol or Pentasa for many years. He has been on one of the newer immune modulator medications and has been seen at [facility] regularly. He has just moved from [place] on Saturday and on Sunday when he was having his first symptoms he went to an urgent care and was placed on a prednisone taper but he came to the Emergency Room because he was worsening.  PAST MEDICAL HISTORY:  1. MS. 2. Crohn's disease. 3. Reflux disease.  4. Sleep apnea.  5. History of hypertension, but is not currently medicated for it.   PAST SURGICAL HISTORY: Appendectomy, knee surgery. Multiple resections of small bowel. I believe three separate operations for small bowel resection due to Crohn exacerbation.   FAMILY HISTORY: Noncontributory.   SOCIAL HISTORY: The patient is disabled. He does not smoke or drink.   REVIEW OF SYSTEMS: 10 system review is performed and negative with the exception of that mentioned in the history of present illness. He had denies melena or hematochezia.   PHYSICAL EXAMINATION:  GENERAL: Healthy, comfortable -appearing Caucasian male patient.    VITAL SIGNS: Temperature is 97, pulse 84, respirations 18, blood pressure 155/79, 96% room air sat. Pain scale of two.   HEENT: No scleral icterus.   NECK: No palpable neck nodes.   CHEST: Clear to auscultation.   CARDIAC: Regular rate and rhythm.   ABDOMEN: Soft. Multiple  scars are noted both on the midline and transversely and complex in nature.   ABDOMEN: Nondistended and only minimally tender if at all. No percussion tenderness. No rebound or tenderness. No guarding and no tympanitic sounds on percussion.   EXTREMITIES: Without edema. Calves are nontender.   NEUROLOGIC: Grossly intact.   INTEGUMENT: No jaundice.   KUB and CT scan are personally reviewed. There is some dilated small bowel areas but there is extensive stool and  gas in the colon. There is no sign of obvious obstruction.   Urinalysis is clean. 1+ bacteria.   Electrolytes show glucose 104 and a CO2 18, alkaline phosphatase 48, hemogram shows a white blood cell count of 15, hemoglobin and hematocrit 14.6 and 44 and a platelet count 296,000, lipase 199.   ASSESSMENT AND PLAN: This is a patient with a fairly classic Crohn exacerbation. He has just moved from [place] and need establishment with a GI doctor here in town. I do not believe that he has a surgical problem at this point and certainly that warrants a trial of aggressive steroid therapy and anti-Crohn's therapy which can be managed by a GI physician.       I would be happy to follow along with this patient's course but doubt that he will require surgical intervention    ____________________________ Jerrol Banana. Burt Knack, MD rec:ljs D: 07/18/2011 00:38:00 ET T: 07/18/2011 10:01:39 ET JOB#: [id number]  cc: Jerrol Banana. Burt Knack, MD, <Dictator> Florene Glen MD ELECTRONICALLY SIGNED 07/19/2011 6:49

## 2014-08-23 NOTE — Consult Note (Signed)
PATIENT NAME:  Walter Diaz, Walter Diaz MR#:  [id number] DATE OF BIRTH:  1965/11/05  DATE OF CONSULTATION:  07/18/2011  CONSULTING PHYSICIAN:  Theodore Demark, NP PRIMARY CARE PHYSICIAN: None presently.  He has just recently moved from [place].   HISTORY/REASON FOR CONSULTATION: Mr. Henard is a 49 year old Caucasian man with a history of Crohn's disease, multiple sclerosis, anemia, gastroesophageal reflux disease, sleep apnea, and multiple abdominal surgeries who was admitted today due to abdominal pain, nausea, vomiting and diarrhea. Please see admission History and Physical for full details. The patient told me today that he has recently moved from [place] within the last week to 10 days. Last Monday he began having diarrhea. He began having abdominal pain on Wednesday with decreased appetite. Over the weekend he started vomiting multiple times. Food smells give him nausea. Solid foods exacerbate his abdominal pain and cause him to vomit. He has not noticed any bloody emesis  . He does state the last vomiting was yesterday, and the last diarrhea stool was yesterday. He reports having a somewhat formed brown stool today. He does report that his abdominal pain is the same as he came in with. He has been taking Dilaudid for this with good control. He also reports chills, hot flashes. He denies fevers, denies sick exposures. He states he has not eaten at restaurants during travel. He drinks bottled water. There is a dog at home. He has no known sick contacts. He is currently receiving IV fluids, steroids and antibiotics. Stool studies are pending. He has been followed in [place] at the [facility] by Dr. Mina Marble  for his Crohn's and Dr. Joaquim Lai for his multiple sclerosis. At one point, he had been managed with Humira for his Crohn's but reports that this gave him the MS. He is on Tysabri monthly for control of both the Crohn's and the MS. This has been administered by Dr. Joaquim Lai in [place]. He reports relatively good control  of his Crohn's on the Tysabri. His last colonoscopy was November of 2012. He reports there was some inflammation on that procedure but does not remember the rest of the report. He did note on the CT done today that he does have some inflammation in the distal portion of the small intestine proximal to his anastomosis with the transverse colon and some dilated loops of small bowel. I noticed Radiology's report of concern regarding infectious or inflammatory etiology versus a partial small bowel obstruction.   PAST MEDICAL/SURGICAL HISTORY:  1. Crohn's disease. 2. Multiple sclerosis. 3. Anemia.  4. Gastroesophageal reflux disease. 5. Sleep apnea, wears a CPAP at night.  6. Multiple abdominal surgeries.  7. His last small bowel resection was in 2009. 8. Staph aureus.  9. Appendectomy.  10. Abdominal scar tissue removal. 11. Right meniscus surgery.   ALLERGIES: Morphine causes itching.   MEDICATIONS:  1. Tysabri once monthly on the 28th.  2. Nexium 40 mg p.o. daily.  3. Phenergan p.r.n.  4. Recently had a prednisone taper.  5. Recently had a prescription for Percocet.   SOCIAL HISTORY: No smoking but chews tobacco. No alcohol in the last four years. He has smoked a little bit of marijuana in the past but not recently. He is on Disability.   FAMILY HISTORY: Pertinent for colon cancer in father.  Mother with Crohn's, sister with Crohn's. Sister with asthma. Liver and lung cancer in the family also.   REVIEW OF SYSTEMS: CONSTITUTIONAL: Positive for hot and cold chills, sweats, and dizziness. Weight has fluctuated over the  last several years. Negative for fever. HEENT: He does have history of right eye deviation and limited movement of the right eye. He has had a little bit of cough and congestion intermittently lately but no difficulty swallowing. No complaints of oral lesions. No sore throat. No drainage from ears. RESPIRATORY: Intermittent mild cough. No complaints of shortness of breath,  sputum or hemoptysis. CARDIOVASCULAR: No chest pain, palpitations, murmur, tachycardia, edema.  GI: As noted. GU: No burning on urination or hematuria or increased frequency. MUSCULOSKELETAL: No unusual muscle or joint pain. No trouble using extremities. INTEGUMENTARY: No erythema, lesion or rash. NEUROLOGIC: No fainting, seizures, history of stroke. History of MS as noted. ENDOCRINE: No history of thyroid issues or diabetes. HEMATOLOGIC: Has had history of anemia. PSYCHIATRIC: No complaints of depression or anxiety at present.   LABORATORY, DIAGNOSTIC AND RADIOLOGICAL DATA:  Most recent lab work: Glucose 104, BUN 18, creatinine 1.14, serum sodium 143, potassium 3.8, chloride 109. GFR greater than 60. Calcium 9.0, lipase 199, total serum protein 7.7, albumin 3.7, total bilirubin 0.3, ALP 48, AST 26, ALT 40.  WBC 15, hemoglobin 14.6, hematocrit 43.8, and platelet 96. Normocytosis with increased RDW. Three-way abdomen with nonobstructive bowel gas pattern. No evidence of acute cardiopulmonary disease.  CT with multiple mild dilated loops of small bowel with mild small bowel wall thickening involving the distal ileum just proximal to the ileocolic anastomosis.   PHYSICAL EXAMINATION:  VITAL SIGNS: Most recent vital signs: Temperature 97.5, pulse 68, respiratory rate 18, blood pressure 144/92,  oxygen saturation 97%.   GENERAL: No acute distress.   PSYCHIATRIC: Mood stable, thoughts logical, good memory.   HEENT: Normocephalic, atraumatic. No redness, drainage, or inflammation to the eyes or nares. Oral mucous membranes are pink and moist.   EARS: Ears are well set.   NECK: No deformity. No JVD, thyromegaly, or lymphadenopathy.   RESPIRATORY: Respirations eupneic. Lungs clear to auscultation bilaterally.   CARDIOVASCULAR: S1, S2. Regular rate and rhythm. No MRG. Peripheral pulses 2+. No edema.   ABDOMEN: Flat, generalized tenderness more on the left side  but not rigid, palpable, generalized  tenderness more specifically on the left side. No hepatosplenomegaly.  Multiple scars on skin most likely from history of multiple abdominal surgeries. No peritoneal signs, rebound tenderness, or hernias.   EXTREMITIES: No clubbing, edema, cyanosis. Moves all extremities well x4. Sensation intact. Strength five out of five.   INTEGUMENTARY: No erythema, lesion or rash. Multiple scars on the abdomen as noted.   NEUROLOGIC: Alert and oriented x3. Cranial nerves II through XII are grossly intact.   IMPRESSION AND PLAN: Nausea, vomiting, and diarrhea with dilated small bowel proximal to anastomosis between transverse colon and small bowel, likely Crohn's flare.  PLAN:  1. Would continue with antibiotics, steroids and fluids.  2. Will look for stool study results.  3. Would like [facility] from [facility]. 4. May require further surgery if no improvement.  5. Gastroenterology will follow.   These services were provided by Stephens November, MSN, [place] in collaboration with Lollie Sails, MD.   ____________________________ Theodore Demark, NP chl:cbb D: 07/19/2011 15:32:38 ET T: 07/19/2011 15:49:10 ET JOB#: [id number]  cc: Theodore Demark, NP, <Dictator> [place] SIGNED 07/27/2011 11:33

## 2014-08-23 NOTE — Consult Note (Signed)
Chief Complaint:   Subjective/Chief Complaint Please see full GI consult.  Walter Diaz seen and examined.  Patient presenting wiht abdominal pain, mostly to the left and below the umbilicus.  He has a history of Crohns disease initially diagnosed74/1988.  He has had four  surgeries for obstruction and states he has had about 5-6 feet of small intestine removed.  Most recently followed at [facility] in [place], on tysabri as treatment for both his crohns and recently dx'd MS.   His last surgery was 2009.  He presented with diarrhea starting last monday, progressing through pain and his abdomen becoming very firm.  CT on admission showing some loops of small bowel dilated.  My concern is for recurrent crohns diseas with an inflammatory narrowing or a combination of this superimposed on a previous stenosis.  Agree with current regimen of abx and steroids.  If there is an inflammatory stenosis, this should improve.  Indeed his sx curretnly are better than on admission.  There is the question of whether the tysabri dose is apropriate, if this represents "breakthrough" while on the tysabri.  Continue current.  I will need to discuss with IBD unit at [facility].  He is not due his next tysabri for aonther week.  He may need to establish at [facility] as Fritzi Mandes is not a broadly used medication and has a number of s/e.  Following.   VITAL SIGNS/ANCILLARY NOTES: **Vital Signs.:   19-Mar-13 18:30   Vital Signs Type Q 4hr   Temperature Temperature (F) 97.9   Celsius 36.6   Temperature Source oral   Pulse Pulse 64   Pulse source per Dinamap   Respirations Respirations 18   Systolic BP Systolic BP 202   Diastolic BP (mmHg) Diastolic BP (mmHg) 84   Mean BP 100   BP Source Dinamap   Pulse Ox % Pulse Ox % 97   Pulse Ox Activity Level  At rest   Oxygen Delivery Room Air/ 21 %   Routine Hem:  18-Mar-13 12:39    WBC (CBC) 15.0   RBC (CBC) 5.51   Hemoglobin (CBC) 14.6   Hematocrit (CBC) 43.8   Platelet Count (CBC) 296    MCV 80   MCH 26.5   MCHC 33.4   RDW 16.9  Routine Chem:  18-Mar-13 12:39    Lipase 199   Glucose, Serum 104   BUN 18   Creatinine (comp) 1.14   Sodium, Serum 143   Potassium, Serum 3.8   Chloride, Serum 109   CO2, Serum 18   Calcium (Total), Serum 9.0  Hepatic:  18-Mar-13 12:39    Bilirubin, Total 0.3   Alkaline Phosphatase 48   SGPT (ALT) 40   SGOT (AST) 26   Total Protein, Serum 7.7   Albumin, Serum 3.7  Routine Chem:  18-Mar-13 12:39    Osmolality (calc) 287   eGFR (African American) >60   eGFR (Non-African American) >60   Anion Gap 16  Routine UA:  18-Mar-13 13:08    Color (UA) Amber   Clarity (UA) Cloudy   Glucose (UA) Negative   Bilirubin (UA) Negative   Ketones (UA) Trace   Specific Gravity (UA) 1.040   Blood (UA) Negative   pH (UA) 5.0   Nitrite (UA) Negative   Leukocyte Esterase (UA) Negative   RBC (UA) 2 /HPF   WBC (UA) 2 /HPF   Bacteria (UA) 1+   Epithelial Cells (UA) <1 /HPF   Mucous (UA) PRESENT   Hyaline Cast (UA) 1 /  LPF   Calcium Oxalate Crystal (UA) PRESENT   Radiology Results: CT:    18-Mar-13 21:08, CT Abdomen and Pelvis With Contrast   CT Abdomen and Pelvis With Contrast    REASON FOR EXAM:    (1) hx of sbo; abd pain and vomiting; (2) hx of sbo;   abd pain and vomiting  COMMENTS:       PROCEDURE: CT  - CT ABDOMEN / PELVIS  W  - Jul 17 2011  9:08PM     RESULT: History: Abdominal pain    Comparison:  None    Technique: Multiple axial images of the abdomen and pelvis were performed   from the lung bases to the pubic symphysis, with p.o. contrast and with   100 ml of Isovue 300 intravenous contrast.    Findings:  The lung bases are clear. There is no pneumothorax. The heart size is   normal.     The liver demonstrates no focal abnormality. There is no intrahepatic or   extrahepatic biliary ductal dilatation. The gallbladder is unremarkable.   The spleen demonstrates no focal abnormality. The kidneys, adrenal   glands, and  pancreas are normal. The bladder is unremarkable.     There multiple dilated loops of small bowel. There is mild small bowel   wall thickening involving the distal ileum just proximal to its   anastomosis with the transverse colon. There is evidence of prior right   hemicolectomy. Contrast is seen within the transverse colon. There is no   pneumoperitoneum, pneumatosis, or portal venous gas. There is no   abdominal or pelvic free fluid. There 2 pathologically enlarged prominent   right sided mesenteric lymph nodes.  The abdominal aorta is normal in caliber .    The osseous structures are unremarkable.    IMPRESSION:     Multiple mild dilated loops of small bowel with mild small bowel wall   thickening involving the distal ileum just proximal to the ileocolic   anastomosis. This may represent a enteritis secondary to an infectious or   inflammatory etiology versus a partial small bowel obstruction. A there   is no evidence of a complete small bowel obstruction as contrast is seen   in the transverse colon.          Verified By: Jennette Banker, M.D., MD   Electronic Signatures: Loistine Simas (MD)  (Signed [phone] 23:21)  Authored: Chief Complaint, VITAL SIGNS/ANCILLARY NOTES, Lab Results, Radiology Results   Last Updated: 19-Mar-13 23:21 by Loistine Simas (MD)

## 2014-08-23 NOTE — Consult Note (Signed)
Brief Consult Note: Diagnosis: crohn's exacerbation.   Patient was seen by consultant.   Consult note dictated.   Recommend further assessment or treatment.   Discussed with Attending MD.   Comments: Needs GI consult for med management of crohn's exacerbation. Nonobstructive but pain and vomiting. Will follow.  Electronic Signatures: Florene Glen (MD)  (Signed 18-Mar-13 22:52)  Authored: Brief Consult Note   Last Updated: 18-Mar-13 22:52 by Florene Glen (MD)

## 2014-08-23 NOTE — Consult Note (Signed)
Brief Consult Note: Diagnosis: Abdominal pain, Crohn's flare.   Patient was seen by consultant.   Consult note dictated.   Discussed with Attending MD.   Comments: Appreciate consult for 49 y/o caucasian man with history of Crohn's, multiple sclerosis (possibly secondary to Humira), GERD, sleep apnea, and multiple abdominal surgeries for NVD, onset last Monday after move from [place]. Has been followed in [place] at the [facility] clinic by Dr Mina Marble for his Crohns and Dr Joaquim Lai for his Multiple Sclerosis: has been on Tysabri for the last 70m with relatively good control of both. Does report last cspy 03/2011 for small Crohn's flare with some inflammation. Has had multple small bowel resections due to Crohn's. Reports sudden onset of diarrhea Last Monday with abdominal pain Wednesday. Has also had nausea and vomiting; states the smell of food makes him ill, and solids trigger abdominal pain and vomiting. Last vomit & diarrhea yesterday. Positive for chills/hot flashes, but negative for fevers. No known sick exposures, states he did not eat at restaraunts during travel. Drinks bottled water, there is a dog at home. No known sick contacts. Is currently receiving IVF, steroids and Cipro/Flagyl, stool studies pending. Denies black/bloody stools. Thought there might have been some blood in the 1st episode of vomiting, but denies presence of such since. Impression: Nausea, vomiting, diarrhea with dilated small bowel proximal to anastomosis between transverse colon and small bowel. Would continue with antibiotics, and steroids. Will look for stool study results.  Would [facility]. May require further surgery if no improvement. Will follow.  Electronic Signatures: Stephens November H (NP)  (Signed 19-Mar-13 18:32)  Authored: Brief Consult Note   Last Updated: 19-Mar-13 18:32 by Theodore Demark (NP)

## 2014-08-23 NOTE — Discharge Summary (Signed)
PATIENT NAME:  Walter Diaz, Walter Diaz MR#:  [id number] DATE OF BIRTH:  03-21-1966  DATE OF ADMISSION:  07/18/2011 DATE OF DISCHARGE:  07/21/2011  CONSULTANTS:  1. Loistine Simas, MD - Gastroenterology. 2. Phoebe Perch, MD - Surgery.  CHIEF COMPLAINT: Abdominal pain.   DISCHARGE DIAGNOSES:  1. Crohn's flare. 2. Partial small bowel obstruction. 3. Multiple sclerosis, stable.  4. History of anemia. 5. Gastroesophageal reflux disease. 6. Sleep apnea, on CPAP nightly.  7. History of small bowel resections and surgery in the past.  8. Appendectomy.  9. History of Staphylococcus aureus in the past.   DISCHARGE MEDICATIONS:  1. Tysabri once a once. Next dose next week. 2. Cipro 500 mg p.o. twice a day for three days. 3. Flagyl 500 mg p.o. three times daily for three days. 4. Percocet 5/325 mg 2 tabs every six hours as needed for pain.  5. Prednisone 50 mg daily until seen by gastroenterology. 6. Phenergan 25 mg orally as needed for nausea and vomiting.   DIET: GI soft, low-residue diet.   ACTIVITY: As tolerated.   DISCHARGE FOLLOWUP: Please follow-up with Dr. Gustavo Lah on 08/02/2011 at 9:30 a.m.   DISPOSITION: Home.   HISTORY OF PRESENT ILLNESS: The patient is a 49 year old male with history of Crohn's for multiple years who presented with abdominal pain, nausea, and vomiting, and had a CT of the abdomen and pelvis done that showed multiple mild dilated loops of small bowel and enteritis, and he was admitted to the hospitalist service for Crohn's flare. For full details, please see the note dictated on 07/18/2011 by Dr. Leslye Peer.   SIGNIFICANT LABS/STUDIES: WBC on arrival 15 and by discharge 11.5, hemoglobin on arrival 14.6 and by discharge 12.9. Initial creatinine 1.14, chloride 109, and CO2 18 on arrival.  Stool cultures were Clostridium difficile negative. No stool WBCs. Stool cultures were overall negative.  Urinalysis was not suggestive of infection.   Stool occult positive. Ova,  parasites, and Giardia is negative in the stool.  CT of abdomen and pelvis with contrast showed mild dilated loops of small bowel with mild small bowel wall thickening involving the distal ileum just proximal to the ileocolic anastomosis. Possible enteritis, infectious versus inflammatory versus partial SBO. No evidence of complete SBO.   3-way of the abdomen x-ray showed nonobstructive bowel gas pattern.   Chest x-ray was negative for acute cardiopulmonary disease.   HOSPITAL COURSE: The patient was admitted to the hospitalist service and started on Cipro and Flagyl. Gastroenterology and Surgery were on board. This appeared to be a Crohn's flare. He was started on steroids as well. He was seen by Gastroenterology and initially started on IV fluids. Diet was slowly advanced and currently his WBC is down and pain is much improved. He has had no significant vomiting, although he initially had some nausea. At this point, he will be discharged with outpatient follow-up. He is on monthly Tysabri injections which is for his Crohn's as well as multiple sclerosis. His next dose is next week. He will be following with Dr. Gustavo Lah on 08/02/2011, his gastroenterologist who he will be seeing next week. Currently his pain is well controlled and his vitals are stable. He will be discharged with the above followup with Gastroenterology.  Furthermore, he has made an appointment with Dr. Ouida Sills for a new patient visit. He has moved from [place] recently and he will be starting his primary care with [facility].   CODE STATUS: THE PATIENT IS A FULL CODE   TOTAL TIME SPENT: 36  minutes. ____________________________ Vivien Presto, MD sa:slb D: 07/21/2011 10:58:09 ET T: 07/21/2011 11:07:13 ET JOB#: [id number]  cc: Vivien Presto, MD, <Dictator> [place] Ouida Sills, MD Lollie Sails, MD Karel Jarvis [facility] MD ELECTRONICALLY SIGNED 08/06/2011 15:06

## 2014-08-23 NOTE — Consult Note (Signed)
Chief Complaint:   Subjective/Chief Complaint feels a little better today, tolerating full liquids. passing flatus, small volume stool   VITAL SIGNS/ANCILLARY NOTES: **Vital Signs.:   20-Mar-13 09:48   Vital Signs Type Q 4hr   Temperature Temperature (F) 97.9   Celsius 36.6   Temperature Source oral   Pulse Pulse 61   Respirations Respirations 18   Systolic BP Systolic BP 846   Diastolic BP (mmHg) Diastolic BP (mmHg) 82   Mean BP 99   BP Source Dinamap   Pulse Ox % Pulse Ox % 96   Pulse Ox Activity Level  At rest   Oxygen Delivery Room Air/ 21 %   Brief Assessment:   Cardiac Regular    Respiratory clear BS    Gastrointestinal details normal Soft  Nondistended  No masses palpable  Bowel sounds normal  diffuse tenderness, mostly llq extending toward umbilicus.   Routine Hem:  20-Mar-13 05:29    WBC (CBC) -   RBC (CBC) -   Hemoglobin (CBC) -   Hematocrit (CBC) -   Platelet Count (CBC) -   MCV -   MCH -   MCHC -   RDW -  Routine Chem:  20-Mar-13 05:29    Glucose, Serum 116   BUN 19   Creatinine (comp) 0.78   Sodium, Serum 141   Potassium, Serum 4.9   Chloride, Serum 104   CO2, Serum 24   Calcium (Total), Serum 8.7   Osmolality (calc) 284   eGFR (African American) >60   eGFR (Non-African American) >60   Anion Gap 13  Routine Hem:  20-Mar-13 05:29    Neutrophil % -   Lymphocyte % -   Monocyte % -   Eosinophil % -   Basophil % -   Neutrophil # -   Lymphocyte # -   Monocyte # -   Eosinophil # -   Basophil # -   Bands -   Segmented Neutrophils -   Lymphocytes -   Variant Lymphocytes -   Monocytes -   Eosinophil -   Basophil -   Metamyelocyte -   Myelocyte -   Promyelocyte -   Blast-Like -   Other Cells -   NRBC -   Diff Comment 1 -   Diff Comment 2 -   Diff Comment 3 -   Diff Comment 4 -   Diff Comment 5 -   Diff Comment 6 -   Diff Comment 7 -   Diff Comment 8 -   Diff Comment 9 -   Diff Comment 10 -    06:23    WBC (CBC) 12.5   RBC  (CBC) 5.07   Hemoglobin (CBC) 13.4   Hematocrit (CBC) 40.6   Platelet Count (CBC) 215   MCV 80   MCH 26.5   MCHC 33.1   RDW 16.3   Neutrophil % 84.6   Lymphocyte % 9.9   Monocyte % 5.3   Eosinophil % 0.0   Basophil % 0.2   Neutrophil # 10.6   Lymphocyte # 1.2   Monocyte # 0.7   Eosinophil # 0.0   Basophil # 0.0   Radiology Results: CT:    18-Mar-13 21:08, CT Abdomen and Pelvis With Contrast   CT Abdomen and Pelvis With Contrast    REASON FOR EXAM:    (1) hx of sbo; abd pain and vomiting; (2) hx of sbo;   abd pain and vomiting  COMMENTS:  PROCEDURE: CT  - CT ABDOMEN / PELVIS  W  - Jul 17 2011  9:08PM     RESULT: History: Abdominal pain    Comparison:  None    Technique: Multiple axial images of the abdomen and pelvis were performed   from the lung bases to the pubic symphysis, with p.o. contrast and with   100 ml of Isovue 300 intravenous contrast.    Findings:  The lung bases are clear. There is no pneumothorax. The heart size is   normal.     The liver demonstrates no focal abnormality. There is no intrahepatic or   extrahepatic biliary ductal dilatation. The gallbladder is unremarkable.   The spleen demonstrates no focal abnormality. The kidneys, adrenal   glands, and pancreas are normal. The bladder is unremarkable.     There multiple dilated loops of small bowel. There is mild small bowel   wall thickening involving the distal ileum just proximal to its   anastomosis with the transverse colon. There is evidence of prior right   hemicolectomy. Contrast is seen within the transverse colon. There is no   pneumoperitoneum, pneumatosis, or portal venous gas. There is no   abdominal or pelvic free fluid. There 2 pathologically enlarged prominent   right sided mesenteric lymph nodes.  The abdominal aorta is normal in caliber .    The osseous structures are unremarkable.    IMPRESSION:     Multiple mild dilated loops of small bowel with mild small bowel  wall   thickening involving the distal ileum just proximal to the ileocolic   anastomosis. This may represent a enteritis secondary to an infectious or   inflammatory etiology versus a partial small bowel obstruction. A there   is no evidence of a complete small bowel obstruction as contrast is seen   in the transverse colon.          Verified By: Jennette Banker, M.D., MD   Assessment/Plan:  Assessment/Plan:   Assessment 1) crohns diseasse, likely exacerbation.  history of multiple surgeries for small and large bowel diseas.   currently on monthly tysabri.    Plan 1) continue current, will discuss with IBDz unit at [facility]. continue full liquids as tolerated.  High risk for obstruction with solid foods at this point.   Electronic Signatures: Loistine Simas (MD)  (Signed 20-Mar-13 15:02)  Authored: Chief Complaint, VITAL SIGNS/ANCILLARY NOTES, Brief Assessment, Lab Results, Radiology Results, Assessment/Plan   Last Updated: 20-Mar-13 15:02 by Loistine Simas (MD)

## 2014-08-26 NOTE — Consult Note (Signed)
PATIENT NAME:  Walter Diaz, Walter Diaz MR#:  [id number] DATE OF BIRTH:  05-16-1965  DATE OF CONSULTATION:  04/08/2014  REFERRING PHYSICIAN:  Vivek J. Verdell Carmine, MD CONSULTING PHYSICIAN:  Arther Dames, MD  REASON FOR CONSULTATION:  Abdominal pain, rectal bleeding, Crohn's.   HISTORY OF PRESENT ILLNESS:  Walter Diaz is a 49 year old male with a history of complicated Crohn's disease who was previously followed at the [facility].  Also with multiple sclerosis, possibly induced from TNF blockers, who is presenting for evaluation of abdominal pain and rectal bleeding. He states that these symptoms have been going on for approximately 1 week, but did become worse over approximately the past 24 hours prompting him to present to the Emergency Room.    Of note, he was also admitted to the hospital in the end of October for a very similar presentation.  He improved with steroids and was discharged home on a prednisone taper.  He then followed up in GI Clinic with Dr. Gustavo Lah.    Of note, unfortunately, he did have a GI Clinic appointment with Dr. Emelda Fear an IBD specialist at [facility].  Unfortunately, he reports that he was not notified of that appointment.    Currently, he has not been on any medications for his Crohn's disease except prednisone 10 mg daily.    PAST MEDICAL HISTORY:  1.  Crohn's disease. 2.  Obstructive sleep apnea. 3.  Multiple sclerosis, possibly related to TNF blockade.   SOCIAL HISTORY:  He denies alcohol or tobacco.  FAMILY HISTORY:  Positive for Crohn's disease.   ALLERGIES:  MORPHINE.  HOME MEDICATIONS:  Prednisone 10 mg daily, Lyrica, Prozac, Phenergan, Copaxone, Nexium.  REVIEW OF SYSTEMS: A 10-system review was conducted and negative except as stated in the HPI.    PHYSICAL EXAMINATION:  VITAL SIGNS:  Temperature is 97.5, pulse 64, respirations are 18, blood pressure 140/90, pulse oximetry is 100% on room air. PHYSICAL EXAMINATION: GENERAL: Alert and oriented times 4.  No  acute distress. Appears stated age. HEENT: Normocephalic/atraumatic. Extraocular movements are intact. Anicteric. NECK: Soft, supple. JVP appears normal. No adenopathy. CHEST: Clear to auscultation. No wheeze or crackle. Respirations unlabored. HEART: Regular. No murmur, rub, or gallop.  Normal S1 and S2. ABDOMEN: Soft, nondistended.  Normal active bowel sounds in all four quadrants.  No organomegaly. No masses.  Positive for right lower quadrant and epigastric tenderness to palpation.  EXTREMITIES: No swelling, well perfused. SKIN: No rash or lesion. Skin color, texture, turgor normal. NEUROLOGICAL: Grossly intact. PSYCHIATRIC: Normal tone and affect. MUSCULOSKELETAL: No joint swelling or erythema.   LABORATORY DATA:  Sodium is 136, potassium 4.9, BUN 14, creatinine 0.89, lipase was normal.  Liver enzymes are normal.  Albumin 3.1.  White count is 7.2.  Hemoglobin 13.5, hematocrit 41.  Platelets are 184,000.  His INR is 1.0.    IMAGING:  He does not have a CT scan this admission. On the prior admission, he did have a CT.  This was back in October.  He had a CT of the abdomen and pelvis which did show mild bowel wall thickening and adjacent mesenteric stranding in the distal small bowel and also in the distal rectum compatible with inflammatory bowel disease.    ASSESSMENT AND PLAN:  Abdominal pain, diarrhea, rectal bleeding:  This is likely consistent with a Crohn's flare.  He was only on prednisone 10 mg daily and likely needs to be started on a different biologic other than a TNF blocking agent.  Unfortunately, he did not show  to an appointment with the [facility] specialist, Dr. Emelda Fear.    RECOMMENDATIONS: 1.  I have started him on Solu-Medrol 20 mg every 8 hours.  If he is doing well in the morning, he can be transitioned over to an oral prednisone taper. I would recommend 40 mg for a week, 30 mg for a week, 20 mg for a week, and then 10 mg until he can be started on a biologic agent.   2.  It  is okay to continue Cipro and Flagyl. 3.  To obtain stool studies if he has any further diarrhea.  4.  We will need to arrange another visit at the tertiary center given the complexity of his Crohn's disease and the possibility that he developed multiple sclerosis on a TNF agent.   Thank you for this consult.   ____________________________ Arther Dames, MD mr:LT D: 04/08/2014 18:15:22 ET T: 04/08/2014 18:34:56 ET JOB#: [id number]  cc: Arther Dames, MD, <Dictator> Mellody Life MD ELECTRONICALLY SIGNED 05/04/2014 14:17

## 2014-09-14 ENCOUNTER — Emergency Department
Admission: EM | Admit: 2014-09-14 | Discharge: 2014-09-14 | Discharge status: Against Medical Advice | Payer: Medicare Other | Attending: Emergency Medicine | Admitting: Emergency Medicine

## 2014-09-14 ENCOUNTER — Encounter: Payer: Self-pay | Admitting: Emergency Medicine

## 2014-09-14 ENCOUNTER — Encounter: Payer: Self-pay | Admitting: Urgent Care

## 2014-09-14 DIAGNOSIS — R05 Cough: Secondary | ICD-10-CM | POA: Insufficient documentation

## 2014-09-14 DIAGNOSIS — Z720 Tobacco use: Secondary | ICD-10-CM | POA: Insufficient documentation

## 2014-09-14 DIAGNOSIS — K50911 Crohn's disease, unspecified, with rectal bleeding: Secondary | ICD-10-CM | POA: Insufficient documentation

## 2014-09-14 DIAGNOSIS — Z7952 Long term (current) use of systemic steroids: Secondary | ICD-10-CM | POA: Insufficient documentation

## 2014-09-14 DIAGNOSIS — K625 Hemorrhage of anus and rectum: Secondary | ICD-10-CM | POA: Diagnosis present

## 2014-09-14 DIAGNOSIS — Z9089 Acquired absence of other organs: Secondary | ICD-10-CM | POA: Insufficient documentation

## 2014-09-14 DIAGNOSIS — Z792 Long term (current) use of antibiotics: Secondary | ICD-10-CM | POA: Diagnosis not present

## 2014-09-14 HISTORY — DX: Multiple sclerosis: G35

## 2014-09-14 HISTORY — DX: Multiple sclerosis, unspecified: G35.D

## 2014-09-14 LAB — COMPREHENSIVE METABOLIC PANEL
ALBUMIN: 3.6 g/dL (ref 3.5–5.0)
ALT: 23 U/L (ref 17–63)
AST: 22 U/L (ref 15–41)
Alkaline Phosphatase: 39 U/L (ref 38–126)
Anion gap: 5 (ref 5–15)
BUN: 23 mg/dL — ABNORMAL HIGH (ref 6–20)
CALCIUM: 9 mg/dL (ref 8.9–10.3)
CO2: 26 mmol/L (ref 22–32)
CREATININE: 1.07 mg/dL (ref 0.61–1.24)
Chloride: 108 mmol/L (ref 101–111)
GFR calc Af Amer: >60 mL/min (ref >60)
GFR calc non Af Amer: >60 mL/min (ref >60)
Glucose, Bld: 96 mg/dL (ref 65–99)
Potassium: 4.7 mmol/L (ref 3.5–5.1)
Sodium: 139 mmol/L (ref 135–145)
TOTAL PROTEIN: 6.6 g/dL (ref 6.5–8.1)
Total Bilirubin: 0.3 mg/dL (ref 0.3–1.2)

## 2014-09-14 LAB — CBC
HCT: 40.4 % (ref 40.0–52.0)
Hemoglobin: 13.1 g/dL (ref 13.0–18.0)
MCH: 25.6 pg — ABNORMAL LOW (ref 26.0–34.0)
MCHC: 32.3 g/dL (ref 32.0–36.0)
MCV: 79.1 fL — ABNORMAL LOW (ref 80.0–100.0)
Platelets: 206 10*3/uL (ref 150–440)
RBC: 5.11 MIL/uL (ref 4.40–5.90)
RDW: 15.3 % — ABNORMAL HIGH (ref 11.5–14.5)
WBC: 7.3 10*3/uL (ref 3.8–10.6)

## 2014-09-14 NOTE — ED Notes (Signed)
Spoke with Archie Balboa, MD and made him aware of presenting c/o and triage assessment. MD also made aware that patient was here last night for the same, however he was never seen - left around 0200 secondary to wait times. MD advised by this RN that patient continues to report bleeding from his rectum. Per Dr. Archie Balboa - no new lab orders at this time.

## 2014-09-14 NOTE — ED Notes (Signed)
Patient presents with c/o LUQ abd pain and passing BRBPR since 2100 last night. PMH significant for Crohn's disease. Patient advises that he came in last night, however left secondary to the wait times. Blood started off dark and has progressed to bright red per patient report.

## 2014-09-14 NOTE — ED Notes (Signed)
Pt to ed with c/o rectal bleeding since Friday.  Pt with hx of chrons, pmd at [facility].

## 2014-09-15 ENCOUNTER — Emergency Department
Admission: EM | Admit: 2014-09-15 | Discharge: 2014-09-15 | Disposition: A | Discharge status: Home / Self Care | Payer: Medicare Other | Attending: Emergency Medicine | Admitting: Emergency Medicine

## 2014-09-15 ENCOUNTER — Emergency Department: Payer: Medicare Other

## 2014-09-15 DIAGNOSIS — K50111 Crohn's disease of large intestine with rectal bleeding: Secondary | ICD-10-CM

## 2014-09-15 HISTORY — DX: Crohn's disease, unspecified, without complications: K50.90

## 2014-09-15 MED ORDER — ONDANSETRON HCL 4 MG/2ML IJ SOLN
INTRAMUSCULAR | Status: AC
  Administered 2014-09-15: 4 mg via INTRAVENOUS
  Filled 2014-09-15: qty 2

## 2014-09-15 MED ORDER — OXYCODONE-ACETAMINOPHEN 5-325 MG PO TABS: 1.0000 | ORAL_TABLET | Freq: Four times a day (QID) | ORAL | Status: DC | PRN

## 2014-09-15 MED ORDER — SODIUM CHLORIDE 0.9 % IV BOLUS (SEPSIS)
1000.0000 mL | Freq: Once | INTRAVENOUS | Status: AC
  Administered 2014-09-15: 1000 mL via INTRAVENOUS

## 2014-09-15 MED ORDER — PROMETHAZINE HCL 12.5 MG PO TABS: 12.5000 mg | ORAL_TABLET | Freq: Four times a day (QID) | ORAL | Status: DC | PRN

## 2014-09-15 MED ORDER — IOHEXOL 300 MG/ML  SOLN
100.0000 mL | Freq: Once | INTRAMUSCULAR | Status: AC | PRN
  Administered 2014-09-15: 100 mL via INTRAVENOUS

## 2014-09-15 MED ORDER — HYDROMORPHONE HCL 1 MG/ML IJ SOLN
1.0000 mg | Freq: Once | INTRAMUSCULAR | Status: AC
  Administered 2014-09-15: 1 mg via INTRAVENOUS

## 2014-09-15 MED ORDER — IOHEXOL 240 MG/ML SOLN
25.0000 mL | INTRAMUSCULAR | Status: AC
  Administered 2014-09-15 (×2): 25 mL via ORAL

## 2014-09-15 MED ORDER — PREDNISONE 20 MG PO TABS
60.0000 mg | ORAL_TABLET | Freq: Once | ORAL | Status: AC
  Administered 2014-09-15: 20 mg via ORAL

## 2014-09-15 MED ORDER — HYDROMORPHONE HCL 1 MG/ML IJ SOLN
INTRAMUSCULAR | Status: AC
  Administered 2014-09-15: 1 mg via INTRAVENOUS
  Filled 2014-09-15: qty 1

## 2014-09-15 MED ORDER — ONDANSETRON HCL 4 MG/2ML IJ SOLN
4.0000 mg | Freq: Once | INTRAMUSCULAR | Status: AC
  Administered 2014-09-15: 4 mg via INTRAVENOUS

## 2014-09-15 MED ORDER — IOHEXOL 240 MG/ML SOLN: 25.0000 mL | Freq: Once | INTRAMUSCULAR | Status: DC | PRN

## 2014-09-15 MED ORDER — CIPROFLOXACIN HCL 500 MG PO TABS: 500.0000 mg | ORAL_TABLET | Freq: Two times a day (BID) | ORAL | Status: AC

## 2014-09-15 MED ORDER — PREDNISONE 20 MG PO TABS: 60.0000 mg | ORAL_TABLET | Freq: Every day | ORAL | Status: AC

## 2014-09-15 MED ORDER — PREDNISONE 20 MG PO TABS
ORAL_TABLET | ORAL | Status: AC
  Filled 2014-09-15: qty 3

## 2014-09-15 NOTE — ED Notes (Signed)
Pt has abd pain and bloody stools.  Hx chron's dz.  No vomiting.  Has nausea.  Sx since yesterday.   Pt had bowel resection.  Denies etoh use.   Iv started.  md at bedside.

## 2014-09-15 NOTE — ED Notes (Signed)
Pt states pain improved.  Pt waiting on ct scan.  Iv fluids infusing

## 2014-09-15 NOTE — ED Notes (Signed)
Drinking po contrast.  Iv meds given.  Iv fluids infusing.

## 2014-09-15 NOTE — ED Provider Notes (Signed)
[facility] Emergency Department Provider Note  ____________________________________________  Time seen: Approximately 147 AM  I have reviewed the triage vital signs and the nursing notes.   HISTORY  Chief Complaint Rectal Bleeding    HPI Walter Diaz is a 49 y.o. male with a history of Crohn's disease who comes in with bright red blood per rectum 3 days. The patient reports that he does not typically passed blood in his stool but this is more blood than he normally passes with a flare. The patient reports that he's also had some abdominal pain which has not been improving. The patient reports that he is on a new infusion medication at [facility] and this is his first flare in 4 months. The patient reports that the blood started 3 days ago at night and it was dark but has been getting brighter and brighter over the last few days. The patient reports that he has not had a bowel movement since yesterday but was in the emergency department waiting room twice in the last day. He reports that the most pain is in his left upper quadrant but it hurts all over his abdomen. He has nauseous with no vomiting. He denies any fevers. He reports that he's had a URI for the past 3 weeks and was treated for pneumonia recently. The patient reports that his pain is a 7 out of 10 in intensity.   Past Medical History  Diagnosis Date  . Multiple sclerosis   . Crohn disease     There are no active problems to display for this patient.   Past Surgical History  Procedure Laterality Date  . Small bowel repair    . Hernia repair    . Appendectomy      Current Outpatient Rx  Name  Route  Sig  Dispense  Refill  . ciprofloxacin (CIPRO) 500 MG tablet   Oral   Take 1 tablet (500 mg total) by mouth 2 (two) times daily.   20 tablet   0   . oxyCODONE-acetaminophen (ROXICET) 5-325 MG per tablet   Oral   Take 1 tablet by mouth every 6 (six) hours as needed for severe pain.   12  tablet   0   . predniSONE (DELTASONE) 20 MG tablet   Oral   Take 3 tablets (60 mg total) by mouth daily.   12 tablet   0   . promethazine (PHENERGAN) 12.5 MG tablet   Oral   Take 1 tablet (12.5 mg total) by mouth every 6 (six) hours as needed for nausea or vomiting.   12 tablet   0     Allergies Morphine and related  Family History  Problem Relation Age of Onset  . Cancer Father     Social History History  Substance Use Topics  . Smoking status: Current Every Day Smoker  . Smokeless tobacco: Not on file  . Alcohol Use: No    Review of Systems Constitutional: No fever/chills Eyes: No visual changes. ENT: No sore throat. Cardiovascular: Denies chest pain. Respiratory: Cough Gastrointestinal: abdominal pain.  nausea, bloody stools Genitourinary: Negative for dysuria. Musculoskeletal: Negative for back pain. Skin: Negative for rash. Neurological: Negative for headaches 10-point ROS otherwise negative.  ____________________________________________   PHYSICAL EXAM:  VITAL SIGNS: ED Triage Vitals  Enc Vitals Group     BP 09/14/14 2118 134/83 mmHg     Pulse Rate 09/14/14 2118 98     Resp 09/14/14 2118 20     Temp 09/14/14  2118 98.6 F (37 C)     Temp Source 09/14/14 2118 Oral     SpO2 09/14/14 2118 98 %     Weight 09/14/14 2118 220 lb (99.791 kg)     Height 09/14/14 2118 6' (1.829 m)     Head Cir --      Peak Flow --      Pain Score 09/14/14 2119 7     Pain Loc --      Pain Edu? --      Excl. in [place]? --     Constitutional: Alert and oriented. Well appearing and in no acute distress. Eyes: Conjunctivae are normal. PERRL. EOMI. Head: Atraumatic. Nose: No congestion/rhinnorhea. Mouth/Throat: Mucous membranes are moist.  Oropharynx non-erythematous. Cardiovascular: Normal rate, regular rhythm. Grossly normal heart sounds.  Good peripheral circulation. Respiratory: Normal respiratory effort.  No retractions. Lungs CTAB. Gastrointestinal: Soft diffuse  abdominal tenderness to palpation, positive bowel sounds Genitourinary: Deferred Musculoskeletal: No lower extremity tenderness nor edema.  . Neurologic:  Normal speech and language. No gross focal neurologic deficits are appreciated. Speech is normal. No gait instability. Skin:  Skin is warm, dry and intact. No rash noted. Psychiatric: Mood and affect are normal. Speech and behavior are normal.  ____________________________________________   LABS (all labs ordered are listed, but only abnormal results are displayed)  Labs Reviewed - No data to display ____________________________________________  EKG  None ____________________________________________  RADIOLOGY  CT abdomen and pelvis: Persistent thickening of the distal small bowel, presumed ileum just proximal to the small bowel colonic anastomosis. Overall appearance similar to that of prior exam. No signs of bowel inflammation equivocal thickening of the rectum. ____________________________________________   PROCEDURES  Procedure(s) performed: None  Critical Care performed: No  ____________________________________________   INITIAL IMPRESSION / ASSESSMENT AND PLAN / ED COURSE  Pertinent labs & imaging results that were available during my care of the patient were reviewed by me and considered in my medical decision making (see chart for details).  This is a 49 year old male who comes in today with rectal bleeding 3 days. The patient does have a history of Crohn's disease. The patient did have blood work done previously which was unremarkable but given his significant pain I will treat the patient with some normal saline, Dilaudid, Zofran and perform a CT scan to evaluate for intestinal wall thickening  The patient's blood work from his initial visit is unremarkable. The patient does not have any anemia from his rectal bleeding. The CT scan is unremarkable. The patient did receive a second dose of Dilaudid and  prednisone. I had a discussion with the patient and will discharge him home to follow-up with his GI physician as he has scheduled in 3 days. The patient agrees with this plan. ____________________________________________   FINAL CLINICAL IMPRESSION(S) / ED DIAGNOSES  Final diagnoses:  Crohn's colitis, with rectal bleeding      Loney Hering, MD 09/15/14 [phone]

## 2014-09-15 NOTE — ED Notes (Signed)
Pt uprite on stretcher in exam room watching TV with no distress noted; pt reports slight nausea and left upper abd pain 5/10 at present; pt drinking CT contrast at present as instructed by CT tech

## 2014-09-15 NOTE — Discharge Instructions (Signed)

## 2014-11-14 ENCOUNTER — Inpatient Hospital Stay
Admit: 2014-11-14 | Discharge: 2014-11-15 | Disposition: A | Discharge status: Home / Self Care | Attending: Emergency Medicine

## 2014-11-14 DIAGNOSIS — K50919 Crohn's disease, unspecified, with unspecified complications: Secondary | ICD-10-CM

## 2014-11-14 LAB — BASIC METABOLIC PANEL
Anion Gap: 8 NA
BUN: 13 mg/dL (ref 7–25)
CO2: 27 mmol/L (ref 21–32)
Calcium: 8.5 mg/dL (ref 8.2–10.1)
Chloride: 108 mmol/L (ref 98–109)
Creatinine: 1.47 mg/dL — ABNORMAL HIGH (ref 0.55–1.40)
EGFR IF NonAfrican American: 50.9 mL/min (ref >60)
Glucose: 86 mg/dL (ref 70–100)
Potassium: 4.4 mmol/L (ref 3.5–5.1)
Sodium: 143 mmol/L (ref 135–145)
eGFR African American: >60 mL/min (ref >60)

## 2014-11-14 LAB — CBC WITH AUTO DIFFERENTIAL
Absolute Baso #: 0.1 10*3/uL (ref 0.0–0.2)
Absolute Eos #: 0.3 10*3/uL (ref 0.0–0.5)
Absolute Lymph #: 1.3 10*3/uL (ref 1.0–4.3)
Absolute Mono #: 0.8 10*3/uL (ref 0.0–0.8)
Absolute Neut #: 7.5 10*3/uL — ABNORMAL HIGH (ref 1.8–7.0)
Basophils: 1 %
Eosinophils: 2.7 %
Granulocytes %: 75.1 %
Hematocrit: 34 % — ABNORMAL LOW (ref 40.0–52.0)
Hemoglobin: 10.8 g/dL — ABNORMAL LOW (ref 13.0–18.0)
Lymphocyte %: 13 %
MCH: 22.7 pg — ABNORMAL LOW (ref 26.0–34.0)
MCHC: 31.8 % — ABNORMAL LOW (ref 32.0–36.0)
MCV: 71.5 fL — ABNORMAL LOW (ref 80.0–98.0)
MPV: 7.7 fL (ref 7.4–10.4)
Monocytes: 8.2 %
Platelets: 271 10*3/uL (ref 140–440)
RBC: 4.76 10*6/uL (ref 4.40–5.90)
RDW: 16.8 % — ABNORMAL HIGH (ref 11.5–14.5)
WBC: 10 10*3/uL (ref 3.6–10.7)

## 2014-11-14 MED ORDER — SODIUM CHLORIDE 0.9 % IV BOLUS
0.9 % | Freq: Once | INTRAVENOUS | Status: AC
Start: 2014-11-14 — End: 2014-11-14
  Administered 2014-11-14: 23:00:00 1000 mL via INTRAVENOUS

## 2014-11-14 MED ORDER — ONDANSETRON HCL 4 MG/2ML IJ SOLN
4 MG/2ML | Freq: Once | INTRAMUSCULAR | Status: AC
Start: 2014-11-14 — End: 2014-11-14
  Administered 2014-11-14: 23:00:00 4 mg via INTRAVENOUS

## 2014-11-14 MED ORDER — HYDROMORPHONE HCL 1 MG/ML IJ SOLN
1 MG/ML | INTRAMUSCULAR | Status: AC | PRN
Start: 2014-11-14 — End: 2014-11-14
  Administered 2014-11-14 – 2014-11-15 (×2): 1 mg via INTRAVENOUS

## 2014-11-14 MED FILL — ONDANSETRON HCL 4 MG/2ML IJ SOLN: 4 MG/2ML | INTRAMUSCULAR | Qty: 2

## 2014-11-14 MED FILL — HYDROMORPHONE HCL 1 MG/ML IJ SOLN: 1 MG/ML | INTRAMUSCULAR | Qty: 1

## 2014-11-14 NOTE — ED Triage Notes (Signed)
Pt c/o upper left abdominal pain x 1 week. Describes pain as intermittent sharp and stabbing, increased approximately 20-30 minutes after eating.   Pt c/o nausea and vomiting x 3 days.   Diarrhea 4 x daily.   Pt states pain is similar to crohns flare ups experienced in past.

## 2014-11-14 NOTE — Discharge Instructions (Signed)
Crohn's Disease: Care Instructions  Your Care Instructions     Crohn's disease is a lifelong inflammatory bowel disease (IBD). Parts of the digestive tract get swollen and irritated and may develop deep sores called ulcers. Crohn's disease usually occurs in the last part of the small intestine and the first part of the large intestine. But it can develop anywhere from the mouth to the anus.  The main symptoms of Crohn's disease are belly pain, diarrhea, fever, and weight loss. Some people may have constipation. Crohn's disease also sometimes causes problems with the joints, eyes, or skin. Your symptoms may be mild at some times and severe at others. The disease can also go into remission, which means that it is not active and you have no symptoms. Bad attacks of Crohn's disease often have to be treated in the hospital so that you can get medicines and liquids through a tube in your vein, called an IV. This gives your digestive system time to rest and recover.  Talk with your doctor about the best treatments for you. You may need medicines that help prevent or treat flare-ups of the disease. You may need surgery to remove part of your bowel if you have an abnormal opening in the bowel (fistula), an abscess, or a bowel obstruction. In some cases, surgery is needed if medicines do not work. But symptoms often return to other areas of the intestines after surgery.  Learning good self-care can help you reduce your symptoms and manage Crohn's disease.  Follow-up care is a key part of your treatment and safety. Be sure to make and go to all appointments, and call your doctor if you are having problems. It's also a good idea to know your test results and keep a list of the medicines you take.  How can you care for yourself at home?   Take your medicines exactly as prescribed. Call your doctor if you think you are having a problem with your medicine. You will get more details on the specific medicines your doctor  prescribes.   Do not take anti-inflammatory medicines, such as aspirin, ibuprofen (Advil, Motrin), or naproxen (Aleve). They may make your symptoms worse. Do not take any other medicines or herbal products without talking to your doctor first.   Avoid foods that make your symptoms worse. These might include milk, alcohol, high-fiber foods, or spicy foods.   Eat a healthy diet. Make sure to get enough iron. Rectal bleeding may make you lose iron. Good sources of iron include beef, lentils, spinach, raisins, and iron-enriched breads and cereals.   Drink liquid meal replacements if your doctor recommends them. These are high in calories and contain vitamins and minerals. Severe symptoms may make it hard for your body to absorb vitamins and minerals.   Do not smoke. Smoking makes Crohn's disease worse. If you need help quitting, talk to your doctor about stop-smoking programs and medicines. These can increase your chances of quitting for good.   Seek support from friends and family to help cope with Crohn's disease. The illness can affect all parts of your life. Get counseling if you need it.  When should you call for help?  Call 911 anytime you think you may need emergency care. For example, call if:   You have severe belly pain.   You passed out (lost consciousness).  Call your doctor now or seek immediate medical care if:   You have signs of needing more fluids. You have sunken eyes and a dry   mouth, and you pass only a little dark urine.   You have pain and swelling in the anal area, or you have pus draining from the anal area.   You have a fever or shaking chills.   Your belly is bloated.  Watch closely for changes in your health, and be sure to contact your doctor if:   Your symptoms get worse.   You have diarrhea for more than 2 weeks.   Your pain is not steadily getting better.   You have unexplained weight loss.   Where can you learn more?   Go to [email or web address] and sign  in to your MyChart account. Enter Y197 in the Search Health Information box to learn more about "Crohn's Disease: Care Instructions."    If you do not have an account, please click on the "Sign Up Now" link.    2006-2016 [employer]. Care instructions adapted under license by [facility]. This care instruction is for use with your licensed healthcare professional. If you have questions about a medical condition or this instruction, always ask your healthcare professional. [employer] disclaims any warranty or liability for your use of this information.  Content Version: 10.8.513193; Current as of: March 20, 2014

## 2014-11-14 NOTE — ED Provider Notes (Signed)
[facility]  eMERGENCY dEPARTMENT eNCOUnter      Pt Name: Dennis BrownerRobert N Rivas  MRN: [id number]  Birthdate Apr 13, 1966  Date of evaluation: 11/14/2014  Provider: Farrel GordonHRISTOPHER Jetty Berland, MD     CHIEF COMPLAINT       Chief Complaint   Patient presents with   ??? Abdominal Pain         HISTORY OF PRESENT ILLNESS   (Location/Symptom, Timing/Onset, Context/Setting, Quality, Duration, Modifying Factors, Severity) Note limiting factors.   HPI    Dennis BrownerRobert N Hurlock is a 49 y.o. male who presents to the emergency department with chief complaint of increasing abdominal pain nausea and vomiting and inability to eat or drink anything without vomiting 15 minutes later.  Patient has history of Crohn's disease.  He has had multiple abdominal surgeries secondary to his Crohn's disease.  He has also had an appendectomy.  Patient states he normally has diarrhea.  He has minimal passage of stool.  He does complain of thirst, lightheadedness and decreased urine output.  He denies fever, chills or night sweats.  He is on immunosuppressive agent.  He denies hematemesis, melena or hematochezia.      Nursing Notes were reviewed.    REVIEW OF SYSTEMS    (2+ for level 4; 10+ for level 5)   Review of Systems   Constitutional: Positive for appetite change. Negative for chills, diaphoresis, fatigue, fever and unexpected weight change.   HENT: Negative for congestion, rhinorrhea, sore throat, trouble swallowing and voice change.    Eyes: Negative for photophobia, pain, discharge and visual disturbance.   Respiratory: Negative for cough, chest tightness, shortness of breath and wheezing.    Cardiovascular: Negative for chest pain, palpitations and leg swelling.   Gastrointestinal: Positive for abdominal distention, abdominal pain, nausea and vomiting. Negative for blood in stool and diarrhea.   Endocrine: Negative for polydipsia, polyphagia and polyuria.   Genitourinary: Positive for decreased urine volume. Negative for dysuria, frequency, hematuria and  urgency.   Musculoskeletal: Negative for arthralgias, back pain, gait problem, myalgias and neck pain.   Skin: Negative.  Negative for rash.   Allergic/Immunologic: Positive for immunocompromised state.   Neurological: Positive for light-headedness. Negative for dizziness, speech difficulty, weakness, numbness and headaches.   Hematological: Does not bruise/bleed easily.   Psychiatric/Behavioral: Negative for dysphoric mood and sleep disturbance. The patient is not nervous/anxious.        PAST MEDICAL HISTORY     Past Medical History   Diagnosis Date   ??? Crohn's disease (HCC)    ??? Multiple sclerosis (HCC)        SURGICAL HISTORY     History reviewed. No pertinent past surgical history.    CURRENT MEDICATIONS       Discharge Medication List as of 11/14/2014  9:41 PM      CONTINUE these medications which have NOT CHANGED    Details   Vedolizumab (ENTYVIO IV) Infuse intravenously Indications: every other month. last infusioon 09/23/14             ALLERGIES     Morphine    FAMILY HISTORY     History reviewed. No pertinent family history.     SOCIAL HISTORY       Social History     Social History   ??? Marital status: Single     Spouse name: N/A   ??? Number of children: N/A   ??? Years of education: N/A     Social History Main Topics   ???  Smoking status: Current Every Day Smoker   ??? Smokeless tobacco: None   ??? Alcohol use No   ??? Drug use: No   ??? Sexual activity: Not Asked     Other Topics Concern   ??? None     Social History Narrative   ??? None       SCREENINGS           PHYSICAL EXAM    (up to 7 for level 4, 8 or more for level 5)   ED Triage Vitals   BP Temp Temp Source Pulse Resp SpO2 Height Weight   11/14/14 1755 11/14/14 1755 11/14/14 1755 11/14/14 1755 11/14/14 1755 11/14/14 1755 -- --   152/93 97.8 ??F (36.6 ??C) Oral 87 14 98 %         Physical Exam   Constitutional: He is oriented to person, place, and time. He appears well-developed and well-nourished. No distress.   Patient is supine and appears ill but not toxic.   HENT:    Head: Normocephalic and atraumatic.   Nose: Nose normal.   Oral and buccal mucosa are dry   Eyes: Conjunctivae and EOM are normal. Pupils are equal, round, and reactive to light. No scleral icterus.   Neck: Normal range of motion. Neck supple. No tracheal deviation present. No thyromegaly present.   Cardiovascular: Normal rate, regular rhythm, normal heart sounds and intact distal pulses.  Exam reveals no gallop and no friction rub.    No murmur heard.  Pulmonary/Chest: Effort normal and breath sounds normal. No respiratory distress.   Abdominal: He exhibits distension. He exhibits no mass. There is tenderness. There is guarding. There is no rebound.   Bowel sounds are diminished.  There is tympany to percussion.   Musculoskeletal: Normal range of motion. He exhibits no edema or tenderness.   Lymphadenopathy:     He has no cervical adenopathy.   Neurological: He is alert and oriented to person, place, and time. No cranial nerve deficit.   Skin: Skin is warm and dry. No rash noted. He is not diaphoretic. No erythema. No pallor.   Psychiatric: He has a normal mood and affect. His behavior is normal.       DIAGNOSTIC RESULTS     EKG:       RADIOLOGY:   3 view x-ray of the abdomen was obtained and reviewed independently by me.  There are findings concerning for early partial bowel obstruction.  Since patient has history of multiple abdominal surgeries and history of prior obstruction a CT of the abdomen with p.o. contrast was ordered to determine if patient does have an early partial bowel obstruction versus an ileus.    Interpretation per the Radiologist below, if available at the time of this note:  Xr Acute Abd Series Chest 1 Vw    Result Date: 11/14/2014  Patient Name:  LANGFORD, CARIAS  MRN:  [id number]  FIN:  [id number]    ---Diagnostic Radiology---   Exam Date/Time        11/14/2014 18:47:38 EDT                              Exam                  CR Abdomen Series w/ Chest 1 View                    Ordering  Physician  Twanna Hy                                           Accession Number      [id number]                                         CPT4 Codes  91478 ()    Reason For Exam  Pain with nausea and vomiting history of Crohn's    Report    HISTORY: Abdominal pain   A frontal AP view of the chest as well as supine and upright plain films of  the  abdomen were obtained.     Comparisons: Radiograph from 06/21/2010 and CT abdomen pelvis from  07/21/2013;   FINDINGS:   The lungs are clear. The cardiac silhouette with within normal limits. No  pneumothorax or pleural effusion is identified.   Surgical material is seen within the right abdomen and pelvis.   There is mild gaseous distention of several loops of colon and small bowel.  There is a paucity of rectal gas. There are air-fluid levels. There is no  evidence of pneumoperitoneum.   IMPRESSION:   No acute cardiopulmonary findings.   Gaseous distention of the bowel with air-fluid levels. Findings are  nonspecific                but can be seen with ileus or early partial small bowel obstruction.   Report Dictated on Workstation: [id number]  ---** Final ---**    Dictating Physician: Lottie Rater, MD, Peter Congo  Signed Date and Time: 11/14/2014 7:00 pm  Signed by: Lottie Rater, MD, Peter Congo  Transcribed Date and Time: 11/14/2014 7:01    Ct Abdomen Pelvis W Oral Contrast Only    Result Date: 11/14/2014  Patient Name:  OLANDER, FRIEDL  MRN:  [id number]  FIN:  [id number]    ---CT---   Exam Date/Time        11/14/2014 20:57:30 EDT                               Exam                  CT Abdomen/Pelvis w/o Contrast (PO Only)              Ordering Physician    Twanna Hy                                            Accession Number      [id number]                                          CPT4 Codes  40347 (CT Abdomen/Pelvis w/o Contrast (PO Only))    Reason For Exam  Nausea vomiting, distention abnormal plain film evaluate for early partial  small bowel obstruction    Report    CT ABDOMEN AND PELVIS WITHOUT CONTRAST   CLINICAL INDICATION: Abdominal distention with right lower abdominal pain,  nausea, vomiting and diarrhea with previous  bowel resections for Crohn's  disease   Scan Parameters: Transaxial sequence through the abdomen and pelvis without  contrast with 3 mm reconstruction.  Sagittal coronal reconstruction images  included.   COMPARISON:  07/21/2013   FINDINGS:   Exam quality: This examination is limited for  the evaluation of solid  organs  and vascular structures due to the lack of intravenous contrast and limited  for  evaluation of the gastrointestinal tract due to lack of oral contrast.    Chest base: Normal.   Liver: Normal size and contour. No identifiable lesion.     Biliary tree:  Normal caliber .      Spleen: Normal.      Adrenals: Normal.     Pancreas: Unremarkable.     Kidneys: No contour abnormality or focal renal lesion.  Renal collecting systems: No calculi, hydronephrosis or ureteral  dilatation.             Free fluid:  None.     Retroperitoneal/mesenteric lymphadenopathy: None.    Aorta: Normal caliber.     Bowel: There is surgical absence the right colon with anastomosis site in  the  mid abdomen. No bowel dilatation is currently identified. A small bowel  loop in  the right lower quadrant demonstrates wall thickening in the range of up to  0.8  cm as identified on sequence 2 image 95, however, no surrounding  inflammatory  change is identified. Several lymph nodes are identified within the  mesentery in  this region with short axis measuring up to 1.5 cm. No fluid collection is  identified adjacent to bowel loops particularly in the right lower quadrant  and  no free air is noted.       Abdominal wall: Surgical clips are noted within the abdominal wall.   Pelvic organs/viscera: No mass identified.   Bladder: No calculi or filling defects.    Pelvic lymphadenopathy: None.   Osseous structures: Spurring and disc space narrowing with vacuum  phenomenon is  noted  at L5-S1 with minor retrolisthesis.     IMPRESSION:    1. Wall thickening involving a small bowel loop in the right lower quadrant  with  adjacent mesenteric lymphadenopathy but no surrounding inflammatory change  or  abscess. Active Crohn's disease in the region is considered  2. No other significant abnormality identified.   Report Dictated on Workstation: [id number]  ---** Final ---**    Dictating Physician: Alvie Heidelberg, JEFFREY  Signed Date and Time: 11/14/2014 9:19 pm  Signed by: Alvie Heidelberg, JEFFREY  Transcribed Date and Time: 11/14/2014 9:20      ED BEDSIDE ULTRASOUND:   Performed by ED Physician - none    LABS:  Labs Reviewed   BASIC METABOLIC PANEL - Abnormal; Notable for the following:     CREATININE 1.47 (*)     All other components within normal limits    Narrative:     Test Performed by [facility], [address] , [place], [place] [address]   CBC WITH AUTO DIFFERENTIAL - Abnormal; Notable for the following:     Hemoglobin 10.8 (*)     Hematocrit 34.0 (*)     MCV 71.5 (*)     MCH 22.7 (*)     MCHC 31.8 (*)     RDW 16.8 (*)     Absolute Neut # 7.5 (*)     All other components within normal limits    Narrative:     Test Performed by [facility], [address] , [place], [place] [address]        All other labs were within normal range or not returned as of this dictation.    EMERGENCY DEPARTMENT COURSE and DIFFERENTIAL DIAGNOSIS/MDM:   Vitals:    Vitals:    11/14/14 1755 11/14/14 2133 11/14/14 2153   BP: (!) 152/93 145/79 142/81   Pulse: 87 71 76   Resp: 14  16   Temp: 97.8 ??F (36.6 ??C)     TempSrc: Oral     SpO2: 98% 98%        Medications   0.9 % sodium chloride bolus (0 mLs Intravenous Stopped 11/14/14 2110)   HYDROmorphone (DILAUDID) injection 1 mg (1 mg Intravenous Given 11/14/14 2129)   ondansetron (ZOFRAN) injection 4 mg (4 mg Intravenous Given 11/14/14 1901)       MDM  Number of Diagnoses or Management Options  Diagnosis management comments: Patient is on immunosuppressive agents and has nausea and  vomiting with decreased urine output and stool output abdominal series was obtained to evaluate for bowel obstruction.  Radiologic imaging reveals findings suggestive of an early partial small bowel obstruction.  Blood work was ordered.  Nurse had difficulty accessing vein and reason why lab results are pending at the time of this dictation.  He will receive a fluid bolus.  A BMP was obtained to assess creatinine function since he is middle-aged and reports decreased urine output secondary to significant vomiting.        REVAL:     Patient was reevaluated at 1905.  He states his nausea is improved, 95%.  He states the pain is diminishing.  He did not request additional medications for his pain.      C Klover Priestly: CT abdomen/pelvis was negative for acute process.  There are findings consistent with a Crohn's exacerbation but no acute abscess or obstruction.  He felt better on reexamination and is tolerating p.o.  He has no evidence of an acute surgical abdomen.  He is comfortable going home.  He was given a prescription for Percocet and Phenergan to use as needed.  He will return if worse and otherwise follow-up closely with his gastroenterologist.    CRITICAL CARE TIME   Total Critical Care time was 0 minutes, excluding separately reportable procedures.  There was a high probability of clinically significant/life threatening deterioration in the patient's condition which required my urgent intervention.     CONSULTS:  None    PROCEDURES:  Unless otherwise noted below, none     Procedures    FINAL IMPRESSION      1. Crohn's disease with complication, unspecified gastrointestinal tract location [facility])          DISPOSITION/PLAN   DISPOSITION Decision to Discharge    PATIENT REFERRED TO:  Elvera Lennox, DO  [address]  [place] [address]  [phone]    In 2 days        DISCHARGE MEDICATIONS:  Discharge Medication List as of 11/14/2014  9:41 PM      START taking these medications    Details   oxyCODONE-acetaminophen  (PERCOCET) 5-325 MG per tablet Take 1-2 tablets by mouth every 6 hours as needed for Pain WARNING:  May cause drowsiness.  May impair ability to operate vehicles or machinery.  Do not use in combination with alcohol., Disp-8 tablet, R-0                (Please note:  Non-plain film images such as CT, Ultrasound  and MRI are read by the radiologist. Plain radiographic images are visualized and preliminarily interpreted by the emergency physician with the below findings:    All EKG's are interpreted by the Emergency Department Physician who either signs or Co-signs this chart in the absence of a cardiologist.    Portions of this note were completed with a voice recognition program.  Efforts were made to edit the dictations but occasionally words and phrases are mis-transcribed.)    Farrel Gordon, MD (electronically signed)  Attending Emergency Physician       Farrel Gordon, MD  11/15/14 [phone]

## 2014-11-15 MED ORDER — OXYCODONE-ACETAMINOPHEN 5-325 MG PO TABS
5-325 MG | ORAL_TABLET | Freq: Four times a day (QID) | ORAL | 0 refills | Status: AC | PRN
Start: 2014-11-15 — End: 2014-11-21

## 2014-11-15 MED FILL — HYDROMORPHONE HCL 1 MG/ML IJ SOLN: 1 MG/ML | INTRAMUSCULAR | Qty: 1

## 2014-11-16 NOTE — Other (Unsigned)
Patient Acct Nbr: [id number]   Primary AUTH/CERT:   Primary Insurance Company Name: [employer]  Primary Insurance Plan name: Medicare A  Primary Insurance Group Number:   Primary Insurance Plan Type: [employer] A  Primary Insurance Policy Number: [id number]    Secondary AUTH/CERT:   Secondary Insurance Company Name: [employer]  Secondary Insurance Plan name: Medicare B  Secondary Insurance Group Number:   Secondary Insurance Plan Type: [employer] B  Secondary Insurance Policy Number: [id number]

## 2014-12-29 ENCOUNTER — Inpatient Hospital Stay
Admission: EM | Admit: 2014-12-29 | Discharge: 2015-01-04 | Disposition: A | Discharge status: Home / Self Care | Admitting: Internal Medicine

## 2014-12-29 DIAGNOSIS — K50119 Crohn's disease of large intestine with unspecified complications: Principal | ICD-10-CM

## 2014-12-29 LAB — BASIC METABOLIC PANEL
Anion Gap: 9 NA
BUN: 13 mg/dL (ref 7–25)
CO2: 28 mmol/L (ref 21–32)
Calcium: 8.6 mg/dL (ref 8.2–10.1)
Chloride: 106 mmol/L (ref 98–109)
Creatinine: 1.04 mg/dL (ref 0.55–1.40)
EGFR IF NonAfrican American: >60 mL/min (ref >60)
Glucose: 87 mg/dL (ref 70–100)
Potassium: 3.8 mmol/L (ref 3.5–5.1)
Sodium: 143 mmol/L (ref 135–145)
eGFR African American: >60 mL/min (ref >60)

## 2014-12-29 LAB — CBC
Hematocrit: 33.3 % — ABNORMAL LOW (ref 40.0–52.0)
Hemoglobin: 10.5 g/dL — ABNORMAL LOW (ref 13.0–18.0)
MCH: 21.4 pg — ABNORMAL LOW (ref 26.0–34.0)
MCHC: 31.5 % — ABNORMAL LOW (ref 32.0–36.0)
MCV: 68.1 fL — ABNORMAL LOW (ref 80.0–98.0)
MPV: 7.1 fL — ABNORMAL LOW (ref 7.4–10.4)
Platelets: 350 10*3/uL (ref 140–440)
RBC: 4.89 10*6/uL (ref 4.40–5.90)
RDW: 18.4 % — ABNORMAL HIGH (ref 11.5–14.5)
WBC: 6.8 10*3/uL (ref 3.6–10.7)

## 2014-12-29 MED ORDER — HYDROMORPHONE HCL 1 MG/ML IJ SOLN
1 MG/ML | INTRAMUSCULAR | Status: DC | PRN
Start: 2014-12-29 — End: 2014-12-29
  Administered 2014-12-29: 19:00:00 0.5 mg via INTRAVENOUS

## 2014-12-29 MED ORDER — ENOXAPARIN SODIUM 40 MG/0.4ML SC SOLN
40 MG/0.4ML | Freq: Every day | SUBCUTANEOUS | Status: DC
Start: 2014-12-29 — End: 2015-01-04
  Administered 2014-12-30 – 2015-01-02 (×2): 40 mg via SUBCUTANEOUS

## 2014-12-29 MED ORDER — HYDROMORPHONE HCL 1 MG/ML IJ SOLN
1 MG/ML | Freq: Once | INTRAMUSCULAR | Status: AC
Start: 2014-12-29 — End: 2014-12-29
  Administered 2014-12-29: 18:00:00 0.5 mg via INTRAVENOUS

## 2014-12-29 MED ORDER — OXYCODONE-ACETAMINOPHEN 5-325 MG PO TABS
5-325 MG | ORAL | Status: DC | PRN
Start: 2014-12-29 — End: 2014-12-30
  Administered 2014-12-30 (×2): 1 via ORAL

## 2014-12-29 MED ORDER — ONDANSETRON HCL 4 MG/2ML IJ SOLN
4 MG/2ML | Freq: Four times a day (QID) | INTRAMUSCULAR | Status: DC | PRN
Start: 2014-12-29 — End: 2015-01-04
  Administered 2014-12-30 – 2015-01-03 (×7): 4 mg via INTRAVENOUS

## 2014-12-29 MED ORDER — PROMETHAZINE HCL 25 MG/ML IJ SOLN
25 MG/ML | Freq: Once | INTRAMUSCULAR | Status: AC
Start: 2014-12-29 — End: 2014-12-29
  Administered 2014-12-29: 18:00:00 12.5 mg via INTRAVENOUS

## 2014-12-29 MED ORDER — METHYLPREDNISOLONE SODIUM SUCC 125 MG IJ SOLR
125 MG | Freq: Four times a day (QID) | INTRAMUSCULAR | Status: DC
Start: 2014-12-29 — End: 2015-01-03
  Administered 2014-12-30 – 2015-01-03 (×17): 60 mg via INTRAVENOUS

## 2014-12-29 MED ORDER — NORMAL SALINE FLUSH 0.9 % IV SOLN
0.9 % | INTRAVENOUS | Status: DC | PRN
Start: 2014-12-29 — End: 2015-01-04

## 2014-12-29 MED ORDER — NORMAL SALINE FLUSH 0.9 % IV SOLN
0.9 % | Freq: Two times a day (BID) | INTRAVENOUS | Status: DC
Start: 2014-12-29 — End: 2015-01-04
  Administered 2014-12-30 – 2015-01-04 (×2): 10 mL via INTRAVENOUS

## 2014-12-29 MED ORDER — SODIUM CHLORIDE 0.9 % IV BOLUS
0.9 % | Freq: Once | INTRAVENOUS | Status: AC
Start: 2014-12-29 — End: 2014-12-29
  Administered 2014-12-29: 18:00:00 1000 mL via INTRAVENOUS

## 2014-12-29 MED ORDER — SODIUM CHLORIDE 0.9 % IV SOLN
0.9 % | INTRAVENOUS | Status: DC
Start: 2014-12-29 — End: 2014-12-29
  Administered 2014-12-29: 20:00:00 via INTRAVENOUS

## 2014-12-29 MED ORDER — SODIUM CHLORIDE 0.9 % IV SOLN
0.9 % | INTRAVENOUS | Status: DC
Start: 2014-12-29 — End: 2015-01-04
  Administered 2014-12-30 – 2015-01-04 (×7): via INTRAVENOUS

## 2014-12-29 MED ORDER — ACETAMINOPHEN 325 MG PO TABS
325 MG | ORAL | Status: DC | PRN
Start: 2014-12-29 — End: 2015-01-04

## 2014-12-29 MED ORDER — MAGNESIUM HYDROXIDE 400 MG/5ML PO SUSP
400 MG/5ML | Freq: Every day | ORAL | Status: DC | PRN
Start: 2014-12-29 — End: 2015-01-04

## 2014-12-29 MED ORDER — BISACODYL 10 MG RE SUPP
10 MG | Freq: Every day | RECTAL | Status: DC | PRN
Start: 2014-12-29 — End: 2015-01-04

## 2014-12-29 MED ORDER — HYDROMORPHONE HCL 1 MG/ML IJ SOLN
1 MG/ML | INTRAMUSCULAR | Status: DC | PRN
Start: 2014-12-29 — End: 2014-12-30
  Administered 2014-12-29 – 2014-12-30 (×4): 0.5 mg via INTRAVENOUS

## 2014-12-29 MED ORDER — METHYLPREDNISOLONE SODIUM SUCC 125 MG IJ SOLR
125 MG | Freq: Once | INTRAMUSCULAR | Status: AC
Start: 2014-12-29 — End: 2014-12-29
  Administered 2014-12-29: 20:00:00 125 mg via INTRAVENOUS

## 2014-12-29 MED ORDER — PANTOPRAZOLE SODIUM 40 MG IV SOLR
40 MG | Freq: Every day | INTRAVENOUS | Status: DC
Start: 2014-12-29 — End: 2015-01-04
  Administered 2014-12-30 – 2015-01-04 (×6): 40 mg via INTRAVENOUS

## 2014-12-29 MED ORDER — SODIUM CHLORIDE 0.9 % IJ SOLN
0.9 % | Freq: Every day | INTRAMUSCULAR | Status: DC
Start: 2014-12-29 — End: 2015-01-04
  Administered 2014-12-30 – 2015-01-03 (×3): 10 mL via INTRAVENOUS

## 2014-12-29 MED FILL — HYDROMORPHONE HCL 1 MG/ML IJ SOLN: 1 MG/ML | INTRAMUSCULAR | Qty: 1

## 2014-12-29 MED FILL — SOLU-MEDROL 125 MG IJ SOLR: 125 MG | INTRAMUSCULAR | Qty: 125

## 2014-12-29 MED FILL — PROMETHAZINE HCL 25 MG/ML IJ SOLN: 25 MG/ML | INTRAMUSCULAR | Qty: 1

## 2014-12-29 MED FILL — PROTONIX 40 MG IV SOLR: 40 mg | INTRAVENOUS | Qty: 40

## 2014-12-29 MED FILL — SOLU-MEDROL 125 MG IJ SOLR: 125 mg | INTRAMUSCULAR | Qty: 125

## 2014-12-29 MED FILL — LOVENOX 40 MG/0.4ML SC SOLN: 40 MG/0.4ML | SUBCUTANEOUS | Qty: 0.4

## 2014-12-29 NOTE — H&P (Signed)
History and Physical    Patient Name:  Dennis Rivas    DOB:  Nov 08, 1965  PCP: Dennis LennoxKimberly C Sheets, DO    Chief Complaint:   Abdominal pain, diarrhea    History of Present Illness:   Dennis Rivas presented to the ER with abdominal pain that has been going on for several days but got worse today. He has also been having clear watery diarrhea, no blood. Had nausea and vomiting today. Has been feeling hot and cold but no real fever. Feels little better at present with medications. He does have a history of Crohn's disease, used to be on Entyvio but has not been getting it for quite some time. Denies chest pain, shortness of breath, cough or wheeze.     Past Medical History:   has a past medical history of Anemia; Crohn's disease (HCC); and Multiple sclerosis (HCC).    Surgical History:   has a past surgical history that includes Abdomen surgery; Appendectomy; Small intestine surgery; hernia repair; and knee surgery.     Social History:   reports that he has been smoking Cigars.  He does not have any smokeless tobacco history on file. He reports that he does not use illicit drugs.     Family History:  family history is not on file.     Medications:  Prior to Admission medications    Medication Sig Start Date End Date Taking? Authorizing Provider   amphetamine-dextroamphetamine (ADDERALL, 10MG ,) 10 MG tablet Take 10 mg by mouth 2 times daily   Yes Historical Provider, MD   Promethazine HCl (PHENERGAN PO) Take 25 mg by mouth as needed   Yes Historical Provider, MD   Multiple Vitamins-Minerals (THERAPEUTIC MULTIVITAMIN-MINERALS) tablet Take 1 tablet by mouth daily   Yes Historical Provider, MD   Vedolizumab (ENTYVIO IV) Infuse intravenously Indications: every other month. last infusioon 09/23/14    Historical Provider, MD       Allergies:  Morphine     Review of Systems:   ?? Constitutional: Denies fever as such, chills+/-, appetite fair.     ?? Eyes: No visual changes or diplopia.  ?? HENT: No Headache. No sore  throat. No runny nose.  ?? Cardiovascular: No chest pain, dyspnea on exertion, palpitations.   ?? Respiratory: No cough, shortness of breath or wheezing.    ?? Gastrointestinal: Abdominal pain, diarrhea, no blood in stool. Nausea and vomiting+.  ?? Genitourinary: No dysuria or hematuria.  ?? Integumentary: No rash or pruritis.  ?? Neurological: No headache, no diplopia, no episode of passing out, no focal weakness or numbness.  ?? Psychiatric: No anxiety, or depression.  ?? Hematologic/Lymphatic: No abnormal bruising or bleeding, blood clots or swollen lymph nodes.  ?? Allergic/Immunologic: No nasal congestion or hives.    Physical Examination:    Vital Signs:   Visit Vitals   ??? BP 142/88   ??? Pulse 72   ??? Temp 97.6 ??F (36.4 ??C) (Oral)   ??? Resp 20   ??? Ht 6' (1.829 m)   ??? Wt 217 lb (98.4 kg)   ??? SpO2 98%   ??? BMI 29.43 kg/m2     General appearance: Awake, no acute distress noted.  Skin: No rashes or ecchymoses noted.  Head: Normocephalic,atruaumatic.  Eyes: PERRL, no pallor or icterus noted.  ENT: External ears normal. Throat is moist.  Neck: Neck supple. No JVD, no lymphadenopathy noted. No thyromegaly. Trachea is midline.   Lungs: Respiratory effort is normal. Lungs clear to auscultation bilaterally.  No ronchi, crackle or rale.  Heart:  S1 and S2 audible.  Regular rate and rhythm. No gallop, murmur, or rub noted.  Abdomen: Abdomen soft, fullness and tenderness in right lower quadrant, right upper quadrant palpation causes pain in right lower quadrant, rebound+ in right lower quadrant. BS sluggish. No mass noted as such.  Extremities: No edema. Peripheral pulses are palpable.  Neuro: Cranial nerves II to XII appear intact. Motor: No focal neurological deficit noted.    Pertinent Labs:  CBC:   Recent Labs      12/29/14   1347   WBC  6.8   RBC  4.89   HGB  10.5*   HCT  33.3*   MCV  68.1*   MCH  21.4*   MCHC  31.5*   RDW  18.4*   PLT  350   MPV  7.1*     BMP:   Recent Labs      12/29/14   1347   NA  143   K  3.8   CL  106   CO2   28   BUN  13   CREATININE  1.04   GLUCOSE  87   CALCIUM  8.6     ABGs: No results for input(s): PO2, PCO2, PH, HCO3, BE, O2SAT in the last 72 hours.  INR: No results for input(s): INR, PROTIME in the last 72 hours.  BNP:  No results found for: BNP  TSH: No results found for: TSH  Cardiac Injury Profile: No results found for: CKTOTAL, CKMB, CKMBINDEX, TROPONINI  Lipid Profile: No components found for: CHLPL  No results found for: TRIG  No results found for: HDL  No results found for: LDLCALC  No results found for: LABVLDL  Hemoglobin A1C: No results found for: LABA1C  No results found for: EAG    Radiology:   Ct Abdomen Pelvis W Iv Contrast Additional Contrast? None    Result Date: 12/29/2014  Patient Name:  Dennis Rivas  MRN:  [id number]  FIN:  [id number]    ---CT---   Exam Date/Time        12/29/2014 14:53:39 EDT                               Exam                  CT Abdomen/Pelvis w/ IV Contrast (IV Onl              Ordering Physician    Robynn Pane., DO, DONALD E.                            Accession Number      [id number]                                          CPT4 Codes  57846 (CT Abdomen/Pelvis w/ IV Contrast (IV Onl),  [id number] ()    Reason For Exam  ABDOMINAL PAIN    Report   CT ABDOMEN AND PELVIS WITH CONTRAST   CLINICAL INDICATION: Crohn's disease. Abdominal pain. Nausea and vomiting.  Multiple prior abdominal surgeries.                        TECHNIQUE: CT imaging through  the abdomen and pelvis with 3 mm  reconstruction  following the IV administration of 75 mL [id number]. Oral contrast was not  given. Coronal and sagittal reconstructions included. Dose reduction was  employed with automated exposure control.   COMPARISON: Noncontrast CT abdomen/pelvis 11/14/2014, 07/21/2013.  Contrast-enhanced CT abdomen/pelvis 07/07/2013 and 03/14/2011.   FINDINGS:    Chest base: Minor subsegmental atelectasis at the dependent lung bases.   Liver: Normal size and contour. No focal lesion.    Biliary tree:  Normal  caliber. Gallbladder is partially contracted. There  are no  calcified gallstones.   Spleen: Normal.     Adrenals: Normal.    Pancreas: Normal.    Kidneys: Kidneys are symmetrically perfused. There are two cysts in the  upper  pole of the right kidney. There are two cysts in the mid and lower portion  of  the right kidney. There is no hydronephrosis or ureteral dilatation.         Free fluid:  None.     Retroperitoneal/mesenteric lymphadenopathy: None.   Aorta: Normal caliber.   Bowel: Evaluation of the stomach, small and large bowel is limited without  oral  contrast. There has been right hemicolectomy with ileocolonic anastomosis  in the  anterior upper abdomen. There is circumferential mural thickening of the  distal  ileum leading up to the ileocolonic anastomosis consistent with acute  inflammation. There is reactive stranding and haziness in the surrounding  fat  and associated hyperemia. There are multiple enlarged lymph nodes in the  adjacent mesentery which are reactive. Findings are consistent with a  Crohn's  flare. There is no abscess. There is no free air.   Abdominal wall: There is scarring within the anterior abdominal wall to the  left  of the umbilicus likely related to a prior ostomy site.   Pelvic organs/viscera: Coarse calcifications in the prostate gland which is  otherwise unremarkable. Urinary bladder is unremarkable.   Pelvic lymphadenopathy: None.   Osseous structures: Sacroiliac joints are open without evidence of  sacroiliitis.  There is degenerative disc disease at L5-S1. There is no suspicious lytic  or  blastic bone lesion.   IMPRESSION:    1. Acute Crohn's flare involving the distal small bowel leading up to the  ileocolonic anastomosis. There is no abscess. There is reactive mesenteric  lymphadenopathy.   Report Dictated on Workstation: [id number]  ---** Final ---**    Dictating Physician: Brayton Mars, MD, Theodoro Clock  Signed Date and Time: 12/29/2014 3:17 pm  Signed by: Brayton Mars, MD, JON B   Transcribed Date and Time: 12/29/2014 3:18    Assessment:    Principal Problem:    Crohn's colitis (HCC)  Crohn's disease with flare  History of multiple sclerosis(drug induced ?)  Peripheral neuropathy    Plan:    1. Admitted to general medical floor.  2. IVF, clear liquid diet, pain and nausea control.  3. Continue solumedrol 60 mg q6 hours. GI consulted.  4. Started on Ciprofloxacin and Flagyl for now, may be stopped if no sign of infection.  5. Continue Lyrica. Patient also takes Adderall (for MS according to patient) at home but I am unable to order it.  6. DVT/GI prophylaxis: Protonix IV, subcutaneous Lovenox.    Dorcas Mcmurray, MD.

## 2014-12-29 NOTE — ED Triage Notes (Signed)
Pt c/o right sided abdominal pain, states feels like a crohns flare up< x 3 days. Describes pain as constant aching with no modifying factors.   Additional symptoms of nausea, vomiting and diarrhea.

## 2014-12-29 NOTE — ED Provider Notes (Signed)
[facility] ED  eMERGENCY dEPARTMENT eNCOUnter      Pt Name: Dennis Rivas  MRN: [id number]  Birthdate 1966-03-13  Date of evaluation: 12/29/2014  Provider: Dannielle Karvonen, DO     CHIEF COMPLAINT       Chief Complaint   Patient presents with   ??? Abdominal Pain         HISTORY OF PRESENT ILLNESS   (Location/Symptom, Timing/Onset, Context/Setting, Quality, Duration, Modifying Factors, Severity) Note limiting factors.   HPI    Dennis Rivas is a 49 y.o. male who presents to the emergency department evaluation of Crohn's flareup. Patient was previously on immunosuppressant drugs for months ago. He states he has been back and forth from [place] to [place]. He has difficulty establishing himself with a new GI physician. He is currently taking nothing for his Crohn's abdominal pain. This is a chronic issue. He used to have Percocet which she received in the emergency room at his previous visit. He denies fever, chills, nausea, vomiting here expresses pain in the midabdomen and he has multiple abdominal surgical scars. He states he has had some diarrhea without black or blood. He has been able to eat and drink normally. He currently has a follow-up with the [facility] clinic to be established in the next week. Patient was previously entivio.  REVIEW OF SYSTEMS    (2+ for level 4; 10+ for level 5)     Review of Systems   Constitutional: Negative for activity change, appetite change, diaphoresis, fatigue and fever.   HENT: Negative.  Negative for congestion, dental problem, drooling, ear pain, rhinorrhea, sinus pressure and sneezing.    Eyes: Negative for discharge and itching.   Respiratory: Negative for cough, chest tightness, shortness of breath and wheezing.    Cardiovascular: Negative for chest pain and palpitations.   Gastrointestinal: Positive for abdominal pain, diarrhea and nausea. Negative for anal bleeding, blood in stool, constipation and vomiting.   Genitourinary: Negative for difficulty urinating,  dysuria, frequency and urgency.   Musculoskeletal: Negative for arthralgias, back pain, gait problem and neck stiffness.   Skin: Negative.    Neurological: Negative for dizziness, syncope and headaches.   Hematological: Negative for adenopathy. Does not bruise/bleed easily.   Psychiatric/Behavioral: Negative for behavioral problems, confusion and suicidal ideas. The patient is not nervous/anxious.    All other systems reviewed and are negative.      PAST MEDICAL HISTORY     Past Medical History   Diagnosis Date   ??? Anemia    ??? Crohn's disease (HCC)    ??? Multiple sclerosis (HCC)        SURGICAL HISTORY     History reviewed. No pertinent past surgical history.    CURRENT MEDICATIONS       Previous Medications    VEDOLIZUMAB (ENTYVIO IV)    Infuse intravenously Indications: every other month. last infusioon 09/23/14       ALLERGIES     Morphine    FAMILY HISTORY     History reviewed. No pertinent family history.       SOCIAL HISTORY       Social History     Social History   ??? Marital status: Single     Spouse name: N/A   ??? Number of children: N/A   ??? Years of education: N/A     Social History Main Topics   ??? Smoking status: Current Every Day Smoker   ??? Smokeless tobacco: None   ??? Alcohol use  No   ??? Drug use: No   ??? Sexual activity: Not Asked     Other Topics Concern   ??? None     Social History Narrative       SCREENINGS           PHYSICAL EXAM    (5+ for level 4, 8+ for level 5)   ED Triage Vitals   BP Temp Temp src Pulse Resp SpO2 Height Weight   -- -- -- -- -- -- -- --                 Physical Exam   Constitutional: He is oriented to person, place, and time. Vital signs are normal. He appears well-developed and well-nourished. No distress.   HENT:   Head: Normocephalic and atraumatic.   Right Ear: External ear normal.   Mouth/Throat: No oropharyngeal exudate.   Eyes: Conjunctivae and EOM are normal. Pupils are equal, round, and reactive to light. Right eye exhibits no discharge. Left eye exhibits no discharge. No  scleral icterus.   Neck: Normal range of motion. Neck supple. No JVD present. No spinous process tenderness and no muscular tenderness present. No rigidity. No tracheal deviation and no edema present. No thyromegaly present.   Cardiovascular: Normal rate, regular rhythm, normal heart sounds, intact distal pulses and normal pulses.  Exam reveals no gallop.    No murmur heard.  Pulmonary/Chest: Effort normal and breath sounds normal. No respiratory distress. He has no wheezes. He has no rales.   Abdominal: Soft. Bowel sounds are normal. He exhibits no distension and no mass. There is tenderness. There is no rebound and no guarding.   Musculoskeletal: Normal range of motion. He exhibits no edema or tenderness.   Neurological: He is alert and oriented to person, place, and time. He has normal reflexes. No cranial nerve deficit. Coordination normal.   Skin: Skin is warm and dry. No rash noted. He is not diaphoretic. No erythema. No pallor.   Psychiatric: He has a normal mood and affect. His behavior is normal. Judgment and thought content normal.       DIAGNOSTIC RESULTS     RADIOLOGY:     Interpretation per the Radiologist below, if available at the time of this note:  Ct Abdomen Pelvis W Iv Contrast Additional Contrast? None    Result Date: 12/29/2014  Patient Name:  Dennis Rivas  MRN:  [id number]  FIN:  [id number]    ---CT---   Exam Date/Time        12/29/2014 14:53:39 EDT                               Exam                  CT Abdomen/Pelvis w/ IV Contrast (IV Onl              Ordering Physician    Robynn Pane., DO, Leemon Ayala E.                            Accession Number      [id number]                                          CPT4 Codes  69629 (CT Abdomen/Pelvis w/ IV Contrast (  IV Onl),  [id number] ()    Reason For Exam  ABDOMINAL PAIN    Report   CT ABDOMEN AND PELVIS WITH CONTRAST   CLINICAL INDICATION: Crohn's disease. Abdominal pain. Nausea and vomiting.  Multiple prior abdominal surgeries.                         TECHNIQUE: CT imaging through the abdomen and pelvis with 3 mm  reconstruction  following the IV administration of 75 mL Isovue-370. Oral contrast was not  given. Coronal and sagittal reconstructions included. Dose reduction was  employed with automated exposure control.   COMPARISON: Noncontrast CT abdomen/pelvis 11/14/2014, 07/21/2013.  Contrast-enhanced CT abdomen/pelvis 07/07/2013 and 03/14/2011.   FINDINGS:    Chest base: Minor subsegmental atelectasis at the dependent lung bases.   Liver: Normal size and contour. No focal lesion.    Biliary tree:  Normal caliber. Gallbladder is partially contracted. There  are no  calcified gallstones.   Spleen: Normal.     Adrenals: Normal.    Pancreas: Normal.    Kidneys: Kidneys are symmetrically perfused. There are two cysts in the  upper  pole of the right kidney. There are two cysts in the mid and lower portion  of  the right kidney. There is no hydronephrosis or ureteral dilatation.         Free fluid:  None.     Retroperitoneal/mesenteric lymphadenopathy: None.   Aorta: Normal caliber.   Bowel: Evaluation of the stomach, small and large bowel is limited without  oral  contrast. There has been right hemicolectomy with ileocolonic anastomosis  in the  anterior upper abdomen. There is circumferential mural thickening of the  distal  ileum leading up to the ileocolonic anastomosis consistent with acute  inflammation. There is reactive stranding and haziness in the surrounding  fat  and associated hyperemia. There are multiple enlarged lymph nodes in the  adjacent mesentery which are reactive. Findings are consistent with a  Crohn's  flare. There is no abscess. There is no free air.   Abdominal wall: There is scarring within the anterior abdominal wall to the  left  of the umbilicus likely related to a prior ostomy site.   Pelvic organs/viscera: Coarse calcifications in the prostate gland which is  otherwise unremarkable. Urinary bladder is unremarkable.   Pelvic  lymphadenopathy: None.   Osseous structures: Sacroiliac joints are open without evidence of  sacroiliitis.  There is degenerative disc disease at L5-S1. There is no suspicious lytic  or  blastic bone lesion.   IMPRESSION:    1. Acute Crohn's flare involving the distal small bowel leading up to the  ileocolonic anastomosis. There is no abscess. There is reactive mesenteric  lymphadenopathy.   Report Dictated on Workstation: [id number]  ---** Final ---**    Dictating Physician: Brayton Mars, MD, JON B  Signed Date and Time: 12/29/2014 3:17 pm  Signed by: Brayton Mars, MD, JON B  Transcribed Date and Time: 12/29/2014 3:18      LABS:  Labs Reviewed   CBC - Abnormal; Notable for the following:     Hemoglobin 10.5 (*)     Hematocrit 33.3 (*)     MCV 68.1 (*)     MCH 21.4 (*)     MCHC 31.5 (*)     RDW 18.4 (*)     MPV 7.1 (*)     All other components within normal limits    Narrative:     Test Performed by  [facility], [address] , [place], [place] [address]   BASIC METABOLIC PANEL    Narrative:     Test Performed by [facility], [address] , [place], [place] [address]       All other labs were within normal range or not returned as of this dictation.    EMERGENCY DEPARTMENT COURSE and DIFFERENTIAL DIAGNOSIS/MDM:   Vitals:    Vitals:    12/29/14 1352 12/29/14 1527   BP: (!) 145/97 139/84   Pulse: 83 82   Resp: 12 14   Temp: 98 ??F (36.7 ??C)    TempSrc: Oral    SpO2: 100% 95%       Medications   HYDROmorphone (DILAUDID) injection 0.5 mg (0.5 mg Intravenous Given 12/29/14 1528)   0.9 % sodium chloride bolus (0 mLs Intravenous Stopped 12/29/14 1502)   promethazine (PHENERGAN) injection 12.5 mg (12.5 mg Intravenous Given 12/29/14 1345)   HYDROmorphone (DILAUDID) injection 0.5 mg (0.5 mg Intravenous Given 12/29/14 1345)   methylPREDNISolone sodium (SOLU-MEDROL) injection 125 mg (125 mg Intravenous Given 12/29/14 1555)       MDM  She was seen and evaluated on arrival. His vital signs are stable and he was afebrile. He has  complaint of lower abdominal pain which has been ongoing for a couple of days. He also had a previous episode of this last month. The patient is in between physicians and is recently moved to the area and has no GI doctor at this time. He is to establish at the [facility] clinic early next month. Patient has been treated with 2 doses of Dilaudid and Phenergan in the ED and continues to have pain. His lab work is unremarkable however the patient was previously on Entyvio is an immunosuppressant. I did obtain a CT of the abdomen and pelvis with IV contrast which does show signs of rare involving the ileum and ileocolonic and is ecchymosis with surrounding fat stranding in mural thickening of the bowel. Patient was given IV fluids and dose of Solu-Medrol in the ED. Since the patient has abnormal CT findings and in conjunction with intractable abdominal pain requiring IV narcotics the patient will need to be admitted for pain control, and possibly GI consult. Patient was amenable to this.    CONSULTS:  None    PROCEDURES:  Unless otherwise noted below, none     Procedures    FINAL IMPRESSION      1. Crohn's colitis, unspecified complication (HCC)          DISPOSITION/PLAN   DISPOSITION     PATIENT REFERRED TO:  No follow-up provider specified.    DISCHARGE MEDICATIONS:  New Prescriptions    No medications on file          (Please note:  Non-plain film images such as CT, Ultrasound and MRI are read by the radiologist. Plain radiographic images are visualized and preliminarily interpreted by the emergency physician with the below findings:    All EKG's are interpreted by the Emergency Department Physician who either signs or Co-signs this chart in the absence of a cardiologist.    Portions of this note were completed with a voice recognition program.  Efforts were made to edit the dictations but occasionally words and phrases are mis-transcribed.)    Dannielle Karvonen, DO (electronically signed)  Attending Emergency Physician             Dannielle Karvonen, DO  12/29/14 [phone]

## 2014-12-29 NOTE — ED Notes (Signed)
Patient just returned from CT, on call bell requesting additional pain medication. MD notified.     Dewain Penning, RN  12/29/14 [phone]

## 2014-12-29 NOTE — Plan of Care (Signed)
Problem: Discharge Planning:  Goal: Participates in care planning  Participates in care planning   Outcome: Ongoing    Problem: Bowel Function - Altered:  Goal: Bowel elimination is within specified parameters  Bowel elimination is within specified parameters   Outcome: Ongoing    Problem: Pain:  Goal: Control of acute pain  Control of acute pain   Outcome: Ongoing  Goal: Control of chronic pain  Control of chronic pain   Outcome: Ongoing

## 2014-12-30 LAB — BASIC METABOLIC PANEL
Anion Gap: 7 NA
BUN: 12 mg/dL (ref 7–25)
CO2: 25 mmol/L (ref 21–32)
Calcium: 8.1 mg/dL — ABNORMAL LOW (ref 8.2–10.1)
Chloride: 107 mmol/L (ref 98–109)
Creatinine: 1.16 mg/dL (ref 0.55–1.40)
EGFR IF NonAfrican American: >60 mL/min (ref >60)
Glucose: 121 mg/dL — ABNORMAL HIGH (ref 70–100)
Potassium: 4.4 mmol/L (ref 3.5–5.1)
Sodium: 139 mmol/L (ref 135–145)
eGFR African American: >60 mL/min (ref >60)

## 2014-12-30 LAB — CBC
Hematocrit: 33.6 % — ABNORMAL LOW (ref 40.0–52.0)
Hemoglobin: 10.5 g/dL — ABNORMAL LOW (ref 13.0–18.0)
MCH: 20.8 pg — ABNORMAL LOW (ref 26.0–34.0)
MCHC: 31.3 % — ABNORMAL LOW (ref 32.0–36.0)
MCV: 66.3 fL — ABNORMAL LOW (ref 80.0–98.0)
MPV: 7.2 fL — ABNORMAL LOW (ref 7.4–10.4)
Platelets: 359 10*3/uL (ref 140–440)
RBC: 5.07 10*6/uL (ref 4.40–5.90)
RDW: 18.1 % — ABNORMAL HIGH (ref 11.5–14.5)
WBC: 12.3 10*3/uL — ABNORMAL HIGH (ref 3.6–10.7)

## 2014-12-30 LAB — C-REACTIVE PROTEIN: CRP: 46.2 mg/L — ABNORMAL HIGH (ref 0.0–2.9)

## 2014-12-30 LAB — SEDIMENTATION RATE: Sed Rate: 89 mm/h — ABNORMAL HIGH (ref 0–10)

## 2014-12-30 MED ORDER — METRONIDAZOLE IN NACL 5-0.79 MG/ML-% IV SOLN
5 mg/mL | Freq: Three times a day (TID) | INTRAVENOUS | Status: DC
Start: 2014-12-30 — End: 2014-12-30
  Administered 2014-12-30 (×2): 500 mg via INTRAVENOUS

## 2014-12-30 MED ORDER — OXYCODONE-ACETAMINOPHEN 5-325 MG PO TABS
5-325 MG | ORAL | Status: DC | PRN
Start: 2014-12-30 — End: 2015-01-04
  Administered 2014-12-30 – 2015-01-04 (×27): 2 via ORAL

## 2014-12-30 MED ORDER — PREGABALIN 75 MG PO CAPS
75 MG | Freq: Two times a day (BID) | ORAL | Status: DC
Start: 2014-12-30 — End: 2015-01-04
  Administered 2014-12-30 – 2015-01-04 (×12): 150 mg via ORAL

## 2014-12-30 MED ORDER — THERAPEUTIC MULTIVIT/MINERAL PO TABS
Freq: Every day | ORAL | Status: DC
Start: 2014-12-30 — End: 2015-01-04
  Administered 2014-12-30 – 2015-01-04 (×6): 1 via ORAL

## 2014-12-30 MED ORDER — CIPROFLOXACIN IN D5W 400 MG/200ML IV SOLN
400 MG/200ML | Freq: Two times a day (BID) | INTRAVENOUS | Status: DC
Start: 2014-12-30 — End: 2014-12-30
  Administered 2014-12-30 (×2): 400 mg via INTRAVENOUS

## 2014-12-30 MED ORDER — MELATONIN 3 MG PO TABS
3 MG | Freq: Every evening | ORAL | Status: DC | PRN
Start: 2014-12-30 — End: 2015-01-04

## 2014-12-30 MED FILL — LOVENOX 40 MG/0.4ML SC SOLN: 40 MG/0.4ML | SUBCUTANEOUS | Qty: 0.4

## 2014-12-30 MED FILL — CIPROFLOXACIN IN D5W 400 MG/200ML IV SOLN: 400 MG/200ML | INTRAVENOUS | Qty: 200

## 2014-12-30 MED FILL — ONDANSETRON HCL 4 MG/2ML IJ SOLN: 4 MG/2ML | INTRAMUSCULAR | Qty: 2

## 2014-12-30 MED FILL — LYRICA 75 MG PO CAPS: 75 mg | ORAL | Qty: 2

## 2014-12-30 MED FILL — PROTONIX 40 MG IV SOLR: 40 mg | INTRAVENOUS | Qty: 40

## 2014-12-30 MED FILL — OXYCODONE-ACETAMINOPHEN 5-325 MG PO TABS: 5-325 MG | ORAL | Qty: 1

## 2014-12-30 MED FILL — SOLU-MEDROL 125 MG IJ SOLR: 125 mg | INTRAMUSCULAR | Qty: 125

## 2014-12-30 MED FILL — OXYCODONE-ACETAMINOPHEN 5-325 MG PO TABS: 5-325 MG | ORAL | Qty: 2

## 2014-12-30 MED FILL — METRONIDAZOLE IN NACL 500-0.79 MG/100ML-% IV SOLN: 500-0.79 MG/100ML-% | INTRAVENOUS | Qty: 100

## 2014-12-30 MED FILL — MELATONIN 3 MG PO TABS: 3 MG | ORAL | Qty: 1

## 2014-12-30 MED FILL — THERA-M PO TABS: ORAL | Qty: 1

## 2014-12-30 MED FILL — HYDROMORPHONE HCL 1 MG/ML IJ SOLN: 1 MG/ML | INTRAMUSCULAR | Qty: 1

## 2014-12-30 MED FILL — METRONIDAZOLE IN NACL 500-0.79 MG/100ML-% IV SOLN: INTRAVENOUS | Qty: 100

## 2014-12-30 NOTE — Plan of Care (Signed)
Problem: Inadequate oral food/beverage intake (NI-2.1)  Goal: Food and/or Nutrient Delivery  Individualized approach for food/nutrient provision.   Intervention: MEAL AND SNACKS (ND-1)  Continue to advanced diet as medically indicated. Continue to provide MVI due to decreased absorptive capacity.              Intervention: MEDICAL FOOD SUPPLEMENTS (ND-3.1)  Provide Ensure Clear BID to provide 200kcals, 7g protein per serving.  Upon diet advancement, provide Ensure Enlive (strawberry) BID to provide 350kcals, 20g protein per serving.

## 2014-12-30 NOTE — Care Coordination-Inpatient (Signed)
49 YEAR OLD MALE ADMITTED THRU [facility] W/CROHNS. PMH OF MS. LIVES W/49 YEAR OLD MALE. MOVED FROM [place] IN JUNE WHERE HE WAS RECEIVING ENTYVIO INFUSIONS. AWAITING GI CONSULT. HAS APPOINTMENT SEPTERMBER 4TH AT [facility] CASE MANAGEMENT WILL FOLLOW.

## 2014-12-30 NOTE — Consults (Signed)
CC: Abdominal Pain    HPI   Dennis Rivas is a 49 y.o. male who is referred by Dallas Schimke, DO for Crohns colitis. Started having abdominal pain with nonbloody watery diarrhea over the last week. The abdominal pain got much worse yesterday associated with severe nausea and vomiting that brought him to the ED.  Patient has a h.o Crohns disease diagnosed 30 years ago, he has underwent 4 small bowel resections. Well known to Dr. Genene Churn in the past.  He was recently started on Entyvio almost a year ago and was getting infusions done once every other month.  He recently moved back up and is having trouble getting his infusions started again. Last infusion was in April.  He is scheduled to see Dr. Dewaine Conger in [place] clinic on sept 4th to possibly start Entyvio again.  Since admission he has been NPO, with IVF, Antibiotics and steroids. He states his nausea is gone, and abdominal pain is much improved. He was able to tolerate a full liquid diet this morning. Had a small BM this morning.           Diagnosis Date   ??? Anemia    ??? Crohn's disease ([place])    ??? Multiple sclerosis ([place])          Procedure Laterality Date   ??? Abdomen surgery     ??? Appendectomy     ??? Small intestine surgery     ??? Hernia repair     ??? Knee surgery       Current Facility-Administered Medications   Medication Dose Route Frequency Provider Last Rate Last Dose   ??? oxyCODONE-acetaminophen (PERCOCET) 5-325 MG per tablet 2 tablet  2 tablet Oral Q4H PRN Dallas Schimke, DO   2 tablet at 12/30/14 0859   ??? sodium chloride flush 0.9 % injection 10 mL  10 mL Intravenous 2 times per day Baker Pierini, MD   10 mL at 12/29/14 2054   ??? sodium chloride flush 0.9 % injection 10 mL  10 mL Intravenous PRN Shreebatsa Dhital, MD       ??? acetaminophen (TYLENOL) tablet 650 mg  650 mg Oral Q4H PRN Shreebatsa Dhital, MD       ??? magnesium hydroxide (MILK OF MAGNESIA) 400 MG/5ML suspension 30 mL  30 mL Oral Daily PRN Shreebatsa Dhital, MD       ??? bisacodyl  (DULCOLAX) suppository 10 mg  10 mg Rectal Daily PRN Baker Pierini, MD       ??? enoxaparin (LOVENOX) injection 40 mg  40 mg Subcutaneous Daily Shreebatsa Dhital, MD   40 mg at 12/30/14 0804   ??? ondansetron (ZOFRAN) injection 4 mg  4 mg Intravenous Q6H PRN Baker Pierini, MD   4 mg at 12/30/14 0437   ??? pantoprazole (PROTONIX) injection 40 mg  40 mg Intravenous Daily Shreebatsa Dhital, MD   40 mg at 12/30/14 0803    And   ??? sodium chloride (PF) 0.9 % injection 10 mL  10 mL Intravenous Daily Shreebatsa Dhital, MD   10 mL at 12/30/14 0810   ??? 0.9 % sodium chloride infusion   Intravenous Continuous Shreebatsa Dhital, MD 75 mL/hr at 12/29/14 2051     ??? methylPREDNISolone sodium (SOLU-MEDROL) injection 60 mg  60 mg Intravenous 4 times per day Baker Pierini, MD   60 mg at 12/30/14 0804   ??? ciprofloxacin (CIPRO) IVPB 400 mg  400 mg Intravenous Q12H Baker Pierini, MD   Stopped at 12/30/14  7841   ??? metronidazole (FLAGYL) 500 mg in NaCl 100 mL IVPB premix  500 mg Intravenous Q8H Dorcas Mcmurray, MD   Stopped at 12/30/14 [phone]   ??? pregabalin (LYRICA) capsule 150 mg  150 mg Oral BID Dorcas Mcmurray, MD   150 mg at 12/30/14 0803   ??? therapeutic multivitamin-minerals 1 tablet  1 tablet Oral Daily Dorcas Mcmurray, MD   1 tablet at 12/30/14 0803   ??? melatonin tablet 3 mg  3 mg Oral Nightly PRN Dorcas Mcmurray, MD         Allergies   Allergen Reactions   ??? Morphine      History reviewed. No pertinent family history.  Social History     Social History   ??? Marital status: Single     Spouse name: N/A   ??? Number of children: N/A   ??? Years of education: N/A     Social History Main Topics   ??? Smoking status: Current Every Day Smoker     Types: Cigars   ??? Smokeless tobacco: None   ??? Alcohol use None   ??? Drug use: No   ??? Sexual activity: Not Asked     Other Topics Concern   ??? None     Social History Narrative       Review of Systems  A comprehensive review of systems was negative except per HPI.           Gen:   Well  nourished, no acute distress  HEENT: EOMI, no pallor, no scleral icteris  CVS:   Regular rate and rhythm, no murmurs, pulses 2+ b/l  Resp:   Normal respiratory effort, clear to ascultation b/l  Skin:   2 post srugical scars on abdomen  Neuro: Moving all extremities without difficulty, no gross defects  Lymph: No lymphadenopathy in axillary or inguinal areas  Psych:  Normal mood and affect  Musc:  Normal Gait, Full Range of Motion  Lymphatic: No palpable lymphadenopathy in cervical or inguinal areas    Abdo:   +BS, +surgical scars, mild diffuse pain, no rebound/guarding    Laboratory  Recent Labs      12/29/14   1347  12/30/14   0556   NA  143  139   K  3.8  4.4   CL  106  107   CO2  28  25   BUN  13  12   CREATININE  1.04  1.16   GLUCOSE  87  121*   CALCIUM  8.6  8.1*     Recent Labs      12/30/14   0556  12/29/14   1347   WBC  12.3*  6.8   HGB  10.5*  10.5*   HCT  33.6*  33.3*   MCV  66.3*  68.1*   PLT  359  350     ESR 89  CRP 46.2     Imaging  CT ABDOMEN AND PELVIS WITH CONTRAST     CLINICAL INDICATION: Crohn's disease. Abdominal pain. Nausea and vomiting.   Multiple prior abdominal surgeries. ?? ?? ?? ?? ?? ?? ?? ?? ?? ?? ??      FINDINGS: ??   Chest base: Minor subsegmental atelectasis at the dependent lung bases.     Liver: Normal size and contour. No focal lesion. ??    Biliary tree: ??Normal caliber. Gallbladder is partially contracted. There   are no   calcified gallstones.     Spleen: Normal. ??  Adrenals: Normal. ??    Pancreas: Normal. ??    Kidneys: Kidneys are symmetrically perfused. There are two cysts in the   upper   pole of the right kidney. There are two cysts in the mid and lower portion   of the right kidney. There is no hydronephrosis or ureteral dilatation.   Free fluid: ??None. ??   Retroperitoneal/mesenteric lymphadenopathy: None.   Aorta: Normal caliber.   Bowel: Evaluation of the stomach, small and large bowel is limited without   oral contrast. There has been right hemicolectomy with ileocolonic  anastomosis   in the anterior upper abdomen. There is circumferential mural thickening of the   distal ileum leading up to the ileocolonic anastomosis consistent with acute   inflammation. There is reactive stranding and haziness in the surrounding   fat and associated hyperemia. There are multiple enlarged lymph nodes in the   adjacent mesentery which are reactive. Findings are consistent with a   Crohn's flare. There is no abscess. There is no free air.   Abdominal wall: There is scarring within the anterior abdominal wall to the   left of the umbilicus likely related to a prior ostomy site.   Pelvic organs/viscera: Coarse calcifications in the prostate gland which is   otherwise unremarkable. Urinary bladder is unremarkable.   Pelvic lymphadenopathy: None.   Osseous structures: Sacroiliac joints are open without evidence of   sacroiliitis.   There is degenerative disc disease at L5-S1. There is no suspicious lytic   or blastic bone lesion.     IMPRESSION: ??   1. Acute Crohn's flare involving the distal small bowel leading up to the   ileocolonic anastomosis. There is no abscess. There is reactive mesenteric   lymphadenopathy.       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        This is a 49 y.o. male consulted for Crohns Colitis     Assessment:  1 Crohns disease    Plan:  1 D/C Antibiotics  2 continue Solumedrol  3. May need surgical evaluation if no significant improvement in 48 hrs      Electronically signed by Annita Brod, MD on 12/30/2014 at 9:49 AM

## 2014-12-30 NOTE — Plan of Care (Signed)
Problem: Discharge Planning:  Goal: Participates in care planning  Participates in care planning   Outcome: Ongoing  Goal: Discharged to appropriate level of care  Discharged to appropriate level of care   Outcome: Ongoing

## 2014-12-30 NOTE — Plan of Care (Signed)
Problem: Discharge Planning:  Goal: Discharged to appropriate level of care  Discharged to appropriate level of care   Outcome: Ongoing

## 2014-12-30 NOTE — Progress Notes (Signed)
Hospitalist Progress Note  12/30/2014 9:51 AM  Subjective:   Admit Date: 12/29/2014  PCP: Elvera Lennox, DO  Presenting complaint:   Chief Complaint   Patient presents with   ??? Abdominal Pain       Daily compalints and ROS: Feels that he has somewhat improved, and states that he usually improves in 5-7 days. He says that his crohns was well controlled with entyvia until he moved to [place] and has not been able to get treatments since he moved. He currently does have fevers and chills, as well as diffuse abdominal pain that is worst in RUQ. Still having oily diarrhea this AM and vomiting with PO intake. No blood in stool or vomit.     DIET CLEAR LIQUID;  Dietary Nutrition Supplements: Clear Liquid Oral Supplement    Intake/Output Summary (Last 24 hours) at 12/30/14 0951  Last data filed at 12/30/14 0914   Gross per 24 hour   Intake   1050 ml   Output    550 ml   Net    500 ml     Medications:     ??? sodium chloride 75 mL/hr at 12/29/14 2051     ??? sodium chloride flush  10 mL Intravenous 2 times per day   ??? enoxaparin  40 mg Subcutaneous Daily   ??? pantoprazole  40 mg Intravenous Daily    And   ??? sodium chloride (PF)  10 mL Intravenous Daily   ??? methylPREDNISolone  60 mg Intravenous 4 times per day   ??? ciprofloxacin  400 mg Intravenous Q12H   ??? metroNIDAZOLE  500 mg Intravenous Q8H   ??? pregabalin  150 mg Oral BID   ??? therapeutic multivitamin-minerals  1 tablet Oral Daily         Objective:   Vitals:   Visit Vitals   ??? BP 140/89   ??? Pulse 63   ??? Temp 97.7 ??F (36.5 ??C)   ??? Resp 19   ??? Ht 6' (1.829 m)   ??? Wt 217 lb (98.4 kg)   ??? SpO2 95%   ??? BMI 29.43 kg/m2     General appearance: in bed sitting upright NAD  Psychiatric: A/Ox3 normal affect  HEENT: PEARRLA AT NC  Lungs: clear to auscultation bilaterally  Heart: regular rate and rhythm, S1, S2 normal, no murmur, click, rub or gallop  Abdomen: soft, non-tender; bowel sounds normal; no masses,  no organomegaly  Extremities: dp2+= no edema  Neurologic: No obvious focal  neurologic deficits.   Skin: W/D normal turgor    Recent Labs      12/29/14   1347  12/30/14   0556   WBC  6.8  12.3*   HGB  10.5*  10.5*   PLT  350  359     Recent Labs      12/29/14   1347  12/30/14   0556   NA  143  139   K  3.8  4.4   CL  106  107   CO2  28  25   BUN  13  12   CREATININE  1.04  1.16   GLUCOSE  87  121*     No results for input(s): AST, ALT, ALB, BILITOT, ALKPHOS in the last 72 hours.  Troponin T: No results for input(s): TROPONINI in the last 72 hours.  Pro-BNP: No results for input(s): BNP in the last 72 hours.  INR: No results for input(s): INR in the last  72 hours.      Assessment and Plan:   1. Crohns flare up - steroids, cipro, flagyl, GI consulted. Leukocytosis increased - follow. Wait for GI input. Continue abx for now.   2. 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 Peripheral neuropathy - lyrica    DVT/GI prophylaxis: protonix lovenox  Advance Directive: Full Code  Discharge planning: home, but course too early to predict.     Principal Problem:    Crohn's colitis (HCC)  Active Problems:    Multiple sclerosis (HCC)      Governor Specking, PA-C  [facility]

## 2014-12-30 NOTE — Plan of Care (Signed)
Problem: Bowel Function - Altered:  Goal: Bowel elimination is within specified parameters  Bowel elimination is within specified parameters   Outcome: Ongoing

## 2014-12-30 NOTE — Plan of Care (Signed)
Problem: Pain:  Goal: Ability to notify healthcare provider of pain before it becomes unmanageable or unbearable will improve  Ability to notify healthcare provider of pain before it becomes unmanageable or unbearable will improve   Outcome: Ongoing

## 2014-12-30 NOTE — Plan of Care (Signed)
Problem: Discharge Planning:  Goal: Participates in care planning  Participates in care planning   Outcome: Ongoing

## 2014-12-30 NOTE — Plan of Care (Signed)
Problem: Pain:  Goal: Control of chronic pain  Control of chronic pain   Outcome: Ongoing

## 2014-12-30 NOTE — Progress Notes (Signed)
Nutrition Assessment    Type and Reason for Visit: Initial    Nutrition Recommendations:   1. Continue to advanced diet as medically indicated.   2. Continue to provide MVI due to decreased absorptive capacity.   3. Provide Ensure Clear BID to provide 200kcals, 7g protein per serving.   4. Upon diet advancement, provide Ensure Enlive (strawberry) BID to provide 350kcals, 20g protein per serving.   5. Recommend addition of a probiotic.    6. Recommend evaluation of vitamin D and ionized calcium status due to decrease oral intake and steroid use.      RD will continue to monitor.     Malnutrition Assessment:    ?? Malnutrition Status: At risk for malnutrition  ?? Context: Chronic illness  ?? Findings of the 6 clinical characteristics of malnutrition (Minimum of 2 out of 6 clinical characteristics is required to make the diagnosis of moderate or severe Protein Calorie Malnutrition based on AND/ASPEN Guidelines):  1. Energy Intake-Not available, not able to assess    2. Weight Loss-10% loss or greater,  (13.2% in 6 months per pt reports )  3. Fat Loss-Unable to assess,    4. Muscle Loss-Unable to assess,    5. Fluid Accumulation-Unable to assess,    6. Grip Strength-Not measured    Nutrition Diagnosis:   ?? Problem: Inadequate oral intake  ?? Etiology: related to Alteration in GI function    ??? Signs and symptoms:  as evidenced by Patient report of, Diet history of poor intake, Weight loss greater than or equal to 10% in 6 months, Diarrhea, GI abnormality    Nutrition Assessment:  ?? Subjective Assessment: Pt adm with bowel flare up. Consult for unintentional wt loss and poor oral intake PTA.   ?? Nutrition-Focused Physical Findings: +BS, no BM documented, Skin WDL   ?? Wound Type: None  ?? Current Nutrition Therapies:  ?? Oral Diet Orders: Clear Liquid   ?? Oral Diet intake: Unable to assess  ?? Oral Nutrition Supplement (ONS) Orders: None  ?? ONS intake: Unable to assess  ?? Anthropometric Measures:  ?? Ht: 6' (182.9 cm)    ?? Current Body Wt: 217 lb (98.4 kg)  ?? Usual Body Wt: 250 lb (113.4 kg) (Pt wt frequently fluctuates. Pt had trouble identifying a UBW. Pt reported wts from 250-280# in a one year time frame.  Pt did quantify weighting 250# around 6 months ago. )  ?? % Weight Change:  ,  6 months  (From pt reports, 250# to 217# - indicating a 33# weight loss within 6 month time frame )  ?? Ideal Body Wt: 142 lb (64.4 kg), % Ideal Body 152%   ?? BMI Classification: BMI 25.0 - 29.9 Overweight  ?? Comparative Standards (Estimated Nutrition Needs):  ?? Estimated Daily Total Kcal: 1612-1935  ?? Estimated Daily Protein (g): 65-77    Estimated Intake vs Estimated Needs: Intake Less Than Needs    Nutrition Risk Level: Moderate    Nutrition Interventions:   Start oral diet, Start ONS  Continued Inpatient Monitoring    Nutrition Evaluation:   ?? Evaluation: Goals set   ?? Goals: Pt will meet >75% of estimated energy needs from meal and supplements.      ?? Monitoring: Meals Intake, Supplement Intake, Tolerance to Diet, Nausea or Vomiting, Weight Status, Diarrhea    See Adult Nutrition Doc Flowsheet for more detail.     Electronically signed by Dwyane Luo, RD, LD on 12/30/14 at 8:55 AM  Contact Number: [phone]

## 2014-12-30 NOTE — Plan of Care (Signed)
Problem: Increased nutrient needs (NI-5.1)  Goal: Food and/or Nutrient Delivery  Individualized approach for food/nutrient provision.  Intervention: MULTIVITAMIN/MINERAL SUPPLEMENT(S) (ND-3.2)   Continue to provide MVI due to decreased absorptive capacity.     Recommend evaluation of vitamin D and ionized calcium status due to decrease oral intake and steroid use.      Intervention: BIOACTIVE SUBSTANCE SUPPLEMENT (ND-3.3)  Recommend addition of a probiotic.

## 2014-12-30 NOTE — Plan of Care (Signed)
Problem: Anxiety/Stress:  Goal: Level of anxiety will decrease  Level of anxiety will decrease   Outcome: Ongoing

## 2014-12-30 NOTE — Plan of Care (Signed)
Problem: Pain:  Goal: Control of acute pain  Control of acute pain   Outcome: Ongoing

## 2014-12-30 NOTE — Plan of Care (Signed)
Problem: Pain:  Goal: Pain level will decrease  Pain level will decrease   Outcome: Ongoing

## 2014-12-31 LAB — BASIC METABOLIC PANEL
Anion Gap: 8 NA
BUN: 16 mg/dL (ref 7–25)
CO2: 25 mmol/L (ref 21–32)
Calcium: 8.2 mg/dL (ref 8.2–10.1)
Chloride: 108 mmol/L (ref 98–109)
Creatinine: 1.12 mg/dL (ref 0.55–1.40)
EGFR IF NonAfrican American: >60 mL/min (ref >60)
Glucose: 152 mg/dL — ABNORMAL HIGH (ref 70–100)
Potassium: 3.9 mmol/L (ref 3.5–5.1)
Sodium: 141 mmol/L (ref 135–145)
eGFR African American: >60 mL/min (ref >60)

## 2014-12-31 LAB — CBC
Hematocrit: 32.4 % — ABNORMAL LOW (ref 40.0–52.0)
Hemoglobin: 10.2 g/dL — ABNORMAL LOW (ref 13.0–18.0)
MCH: 20.9 pg — ABNORMAL LOW (ref 26.0–34.0)
MCHC: 31.4 % — ABNORMAL LOW (ref 32.0–36.0)
MCV: 66.6 fL — ABNORMAL LOW (ref 80.0–98.0)
MPV: 7.2 fL — ABNORMAL LOW (ref 7.4–10.4)
Platelets: 360 10*3/uL (ref 140–440)
RBC: 4.87 10*6/uL (ref 4.40–5.90)
RDW: 18.4 % — ABNORMAL HIGH (ref 11.5–14.5)
WBC: 12.1 10*3/uL — ABNORMAL HIGH (ref 3.6–10.7)

## 2014-12-31 LAB — MISCELLANEOUS SENDOUT

## 2014-12-31 MED ORDER — METHYLPREDNISOLONE SODIUM SUCC 125 MG IJ SOLR
125 MG | INTRAMUSCULAR | Status: AC
Start: 2014-12-31 — End: 2015-01-01

## 2014-12-31 MED FILL — SOLU-MEDROL 125 MG IJ SOLR: 125 mg | INTRAMUSCULAR | Qty: 125

## 2014-12-31 MED FILL — OXYCODONE-ACETAMINOPHEN 5-325 MG PO TABS: 5-325 MG | ORAL | Qty: 2

## 2014-12-31 MED FILL — PROTONIX 40 MG IV SOLR: 40 mg | INTRAVENOUS | Qty: 40

## 2014-12-31 MED FILL — LYRICA 75 MG PO CAPS: 75 mg | ORAL | Qty: 2

## 2014-12-31 MED FILL — SOLU-MEDROL 125 MG IJ SOLR: 125 MG | INTRAMUSCULAR | Qty: 125

## 2014-12-31 MED FILL — LOVENOX 40 MG/0.4ML SC SOLN: 40 MG/0.4ML | SUBCUTANEOUS | Qty: 0.4

## 2014-12-31 MED FILL — THERA-M PO TABS: ORAL | Qty: 1

## 2014-12-31 MED FILL — ONDANSETRON HCL 4 MG/2ML IJ SOLN: 4 MG/2ML | INTRAMUSCULAR | Qty: 2

## 2014-12-31 NOTE — Plan of Care (Signed)
Problem: Pain:  Goal: Pain level will decrease  Pain level will decrease   Outcome: Ongoing

## 2014-12-31 NOTE — Plan of Care (Signed)
Problem: Discharge Planning:  Goal: Participates in care planning  Participates in care planning   Outcome: Ongoing

## 2014-12-31 NOTE — Progress Notes (Signed)
Subjective: Pt had very small, minimal bowel movement yesterday. Abdominal pain improved but still present. No nausea or vomiting. Hx of SBO with several resections in the past, last in 2009.    Objective:     VS:   Visit Vitals   ??? BP 151/72   ??? Pulse 78   ??? Temp 97.4 ??F (36.3 ??C) (Oral)   ??? Resp 16   ??? Ht 6' (1.829 m)   ??? Wt 217 lb (98.4 kg)   ??? SpO2 95%   ??? BMI 29.43 kg/m2     Labs:    No results found for this or any previous visit (from the past 24 hour(s)).  Urine Culture:  No results found for this or any previous visit.    Physical Examination:  General appearance: in no apparent distress and non-toxic  HEENT: Head: Normocephalic, no lesions, without obvious abnormality.  Heart: regular rate and rhythm, S1, S2 normal, no murmur, click, rub or gallop  Lungs: clear to auscultation bilaterally  Abdomen: Abd soft, BS heard. generalized TTP, >right side  Extremities: extremities normal, atraumatic, no cyanosis or edema  Musculoskeletal: No joint swelling, deformity, or tenderness.  Neurologic: no cranial nerve deficit, gait and coordination normal and speech normal  Psychiatric: appropriate, oriented to person, place and time/date  Skin: Skin color, texture, turgor normal. No rashes or lesions.    Assessment and Plan:    (1) Acute Crohn's exacerbation with hx several SBO in past s/p resection: GI following, abx D/C'ed, continue IV Solu-Medrol. Check acute abdominal series  (2) Hx MS  (3) Peripheral neuropathy: Lyrica    Charron Coultas Arvella Merles PA-C

## 2014-12-31 NOTE — Progress Notes (Addendum)
HPI   Dennis Rivas is a 49 y.o. male who we are following on the inpatient service for exacerbation of crohns  Colitis. He had bm this morning that was little thicker than watery stool on his admission. He continues to have discomfort generalized across abdomen. No guarding or rebound. BS hypoactive. Has cough nonproductive that has been going on for over a month. Occasionally productive yellow per patient. No fever or chills. He has just finished eating 2 jello and 2 pudding as I arrive. Reviewed labs and xray results with patient. Advised cannot advance diet till seen by Dr Ileene Rubens because of the ileus vs PSBO. Also the patient reported worsening nausea and abdominal cramping after finishing liquid diet        Diagnosis Date   ??? Anemia    ??? Crohn's disease (HCC)    ??? Multiple sclerosis (HCC)          Procedure Laterality Date   ??? Abdomen surgery     ??? Appendectomy     ??? Small intestine surgery     ??? Hernia repair     ??? Knee surgery       Current Facility-Administered Medications   Medication Dose Route Frequency Provider Last Rate Last Dose   ??? oxyCODONE-acetaminophen (PERCOCET) 5-325 MG per tablet 2 tablet  2 tablet Oral Q4H PRN Virgel Manifold, DO   2 tablet at 12/31/14 1055   ??? sodium chloride flush 0.9 % injection 10 mL  10 mL Intravenous 2 times per day Dorcas Mcmurray, MD   10 mL at 12/29/14 2054   ??? sodium chloride flush 0.9 % injection 10 mL  10 mL Intravenous PRN Shreebatsa Dhital, MD       ??? acetaminophen (TYLENOL) tablet 650 mg  650 mg Oral Q4H PRN Shreebatsa Dhital, MD       ??? magnesium hydroxide (MILK OF MAGNESIA) 400 MG/5ML suspension 30 mL  30 mL Oral Daily PRN Shreebatsa Dhital, MD       ??? bisacodyl (DULCOLAX) suppository 10 mg  10 mg Rectal Daily PRN Dorcas Mcmurray, MD       ??? enoxaparin (LOVENOX) injection 40 mg  40 mg Subcutaneous Daily Shreebatsa Dhital, MD   40 mg at 12/30/14 0804   ??? ondansetron (ZOFRAN) injection 4 mg  4 mg Intravenous Q6H PRN Shreebatsa Dhital, MD   4 mg at  12/31/14 1056   ??? pantoprazole (PROTONIX) injection 40 mg  40 mg Intravenous Daily Shreebatsa Dhital, MD   40 mg at 12/31/14 4098    And   ??? sodium chloride (PF) 0.9 % injection 10 mL  10 mL Intravenous Daily Shreebatsa Dhital, MD   10 mL at 12/30/14 0810   ??? 0.9 % sodium chloride infusion   Intravenous Continuous Shreebatsa Dhital, MD 75 mL/hr at 12/30/14 1443     ??? methylPREDNISolone sodium (SOLU-MEDROL) injection 60 mg  60 mg Intravenous 4 times per day Dorcas Mcmurray, MD   60 mg at 12/31/14 0811   ??? pregabalin (LYRICA) capsule 150 mg  150 mg Oral BID Dorcas Mcmurray, MD   150 mg at 12/31/14 0820   ??? therapeutic multivitamin-minerals 1 tablet  1 tablet Oral Daily Dorcas Mcmurray, MD   1 tablet at 12/31/14 1191   ??? melatonin tablet 3 mg  3 mg Oral Nightly PRN Dorcas Mcmurray, MD         Allergies   Allergen Reactions   ??? Morphine         Visit  Vitals   ??? BP 151/72   ??? Pulse 78   ??? Temp 97.4 ??F (36.3 ??C) (Oral)   ??? Resp 16   ??? Ht 6' (1.829 m)   ??? Wt 217 lb (98.4 kg)   ??? SpO2 95%   ??? BMI 29.43 kg/m2     Gen:   Well nourished, no acute distress  HEENT: EOMI, no pallor, no scleral icteris  CVS:   Regular rate and rhythm, no murmurs, pulses 2+ b/l  Resp:   Normal respiratory effort, clear to ascultation b/l  Skin:   No rashes or lesions  Neuro:  Moving all extremities without difficulty, no gross defects  Lymph: No lymphadenopathy in axillary or inguinal areas  Psych:  Normal mood and affect  Abdo:   +BS, soft, mild diffuse tenderness, non distended, no rebound or guarding, +surgical scars    Laboratory  Recent Labs      12/29/14   1347  12/30/14   0556  12/31/14   0913   NA  143  139  141   K  3.8  4.4  3.9   CL  106  107  108   CO2  28  25  25    BUN  13  12  16    CREATININE  1.04  1.16  1.12   GLUCOSE  87  121*  152*   CALCIUM  8.6  8.1*  8.2     Recent Labs      12/31/14   0913  12/30/14   0556  12/29/14   1347   WBC  12.1*  12.3*  6.8   HGB  10.2*  10.5*  10.5*   HCT  32.4*  33.6*  33.3*   MCV  66.6*   66.3*  68.1*   PLT  360  359  350   Abdominal xray mild atelectasis left lung  Sm bowel distention with gas in colon. Ileus vs psbo.  CRP 46.2, Sed rate 89, Lactoferrin positive stool. Culture is still pending.       This is a 49 y.o. male consulted for     Assessment:  1 Crohns disease  ??2 Ileus vs PSBO     Plan:  1 continue Solumedrol  2 May need surgical evaluation if no significant improvement in 48 hrs      Electronically signed by Domingo Sep, CNP on 12/31/2014 at 12:57 PM     I have evaluated the patient and reviewed the case with [facility]. I agree with the current plan of care including the workup, evaluation, management, and diagnosis. Care plan has been discussed. The above documentation has been reviewed and edited as needed to reflect the findings of my evaluation.  Electronically signed by Learta Codding, MD on 12/31/2014 at 5:04 PM

## 2014-12-31 NOTE — Care Coordination-Inpatient (Signed)
DISCHARGE PLAN REMAINS HOME

## 2014-12-31 NOTE — Plan of Care (Signed)
Problem: Anxiety/Stress:  Goal: Level of anxiety will decrease  Level of anxiety will decrease   Outcome: Ongoing

## 2014-12-31 NOTE — Plan of Care (Signed)
Problem: Discharge Planning:  Goal: Discharged to appropriate level of care  Discharged to appropriate level of care   Outcome: Ongoing

## 2015-01-01 LAB — CBC
Hematocrit: 34 % — ABNORMAL LOW (ref 40.0–52.0)
Hemoglobin: 10.5 g/dL — ABNORMAL LOW (ref 13.0–18.0)
MCH: 20.9 pg — ABNORMAL LOW (ref 26.0–34.0)
MCHC: 30.9 % — ABNORMAL LOW (ref 32.0–36.0)
MCV: 67.5 fL — ABNORMAL LOW (ref 80.0–98.0)
MPV: 7.4 fL (ref 7.4–10.4)
Platelets: 417 10*3/uL (ref 140–440)
RBC: 5.03 10*6/uL (ref 4.40–5.90)
RDW: 18.3 % — ABNORMAL HIGH (ref 11.5–14.5)
WBC: 13 10*3/uL — ABNORMAL HIGH (ref 3.6–10.7)

## 2015-01-01 LAB — BASIC METABOLIC PANEL
Anion Gap: 6 NA
BUN: 15 mg/dL (ref 7–25)
CO2: 29 mmol/L (ref 21–32)
Calcium: 8.5 mg/dL (ref 8.2–10.1)
Chloride: 107 mmol/L (ref 98–109)
Creatinine: 0.99 mg/dL (ref 0.55–1.40)
EGFR IF NonAfrican American: >60 mL/min (ref >60)
Glucose: 96 mg/dL (ref 70–100)
Potassium: 4.6 mmol/L (ref 3.5–5.1)
Sodium: 142 mmol/L (ref 135–145)
eGFR African American: >60 mL/min (ref >60)

## 2015-01-01 LAB — CULTURE, STOOL

## 2015-01-01 MED ORDER — HYDROMORPHONE HCL 1 MG/ML IJ SOLN
1 MG/ML | INTRAMUSCULAR | Status: DC | PRN
Start: 2015-01-01 — End: 2015-01-04
  Administered 2015-01-02 – 2015-01-04 (×14): 1 mg via INTRAVENOUS

## 2015-01-01 MED ORDER — HYDROMORPHONE HCL 1 MG/ML IJ SOLN
1 MG/ML | INTRAMUSCULAR | Status: DC | PRN
Start: 2015-01-01 — End: 2015-01-04
  Administered 2015-01-01 (×3): 0.5 mg via INTRAVENOUS

## 2015-01-01 MED ORDER — HYDRALAZINE HCL 20 MG/ML IJ SOLN
20 MG/ML | Freq: Four times a day (QID) | INTRAMUSCULAR | Status: DC | PRN
Start: 2015-01-01 — End: 2015-01-04

## 2015-01-01 MED FILL — LOVENOX 40 MG/0.4ML SC SOLN: 40 MG/0.4ML | SUBCUTANEOUS | Qty: 0.4

## 2015-01-01 MED FILL — PROTONIX 40 MG IV SOLR: 40 mg | INTRAVENOUS | Qty: 40

## 2015-01-01 MED FILL — OXYCODONE-ACETAMINOPHEN 5-325 MG PO TABS: 5-325 MG | ORAL | Qty: 2

## 2015-01-01 MED FILL — HYDROMORPHONE HCL 1 MG/ML IJ SOLN: 1 MG/ML | INTRAMUSCULAR | Qty: 1

## 2015-01-01 MED FILL — SOLU-MEDROL 125 MG IJ SOLR: 125 mg | INTRAMUSCULAR | Qty: 125

## 2015-01-01 MED FILL — THERA-M PO TABS: ORAL | Qty: 1

## 2015-01-01 MED FILL — LYRICA 75 MG PO CAPS: 75 mg | ORAL | Qty: 2

## 2015-01-01 NOTE — Progress Notes (Signed)
Subjective: Patient states he had a "terrible morning", pain uncontrolled, +nausea, small liquid stool this AM.     Objective:     VS:   Visit Vitals   ??? BP (!) 161/97   ??? Pulse 67   ??? Temp 97.4 ??F (36.3 ??C)   ??? Resp 19   ??? Ht 6' (1.829 m)   ??? Wt 217 lb (98.4 kg)   ??? SpO2 96%   ??? BMI 29.43 kg/m2       Labs:    Recent Results (from the past 24 hour(s))   CBC    Collection Time: 01/01/15  4:36 AM   Result Value Ref Range    WBC 13.0 (H) 3.6 - 10.7 10*3/uL    RBC 5.03 4.40 - 5.90 10*6/uL    Hemoglobin 10.5 (L) 13.0 - 18.0 g/dL    Hematocrit 34.0 (L) 40.0 - 52.0 %    MCV 67.5 (L) 80.0 - 98.0 fL    MCH 20.9 (L) 26.0 - 34.0 pg    MCHC 30.9 (L) 32.0 - 36.0 %    RDW 18.3 (H) 11.5 - 14.5 %    Platelets 417 140 - 440 10*3/uL    MPV 7.4 7.4 - 10.4 fL   Basic Metabolic Panel    Collection Time: 01/01/15  4:36 AM   Result Value Ref Range    Sodium 142 135 - 145 mmol/L    Potassium 4.6 3.5 - 5.1 mmol/L    Chloride 107 98 - 109 mmol/L    CO2 29 21 - 32 mmol/L    Anion Gap 6 NA    Glucose 96 70 - 100 mg/dL    BUN 15 7 - 25 mg/dL    CREATININE 0.99 0.55 - 1.40 mg/dL    eGFR African American >60.0 >60 mL/min    EGFR IF NonAfrican American >60.0 >60 mL/min    Calcium 8.5 8.2 - 10.1 mg/dL     Urine Culture:  No results found for this or any previous visit.    Physical Examination:  General appearance: in no apparent distress and non-toxic  HEENT: Head: Normocephalic, no lesions, without obvious abnormality.  Heart: regular rate and rhythm, S1, S2 normal, no murmur, click, rub or gallop  Lungs: clear to auscultation bilaterally  Abdomen: Abd soft, BS hypoactive. TTP right sided abdomen  Extremities: extremities normal, atraumatic, no cyanosis or edema  Musculoskeletal: No joint swelling, deformity, or tenderness.  Neurologic: no cranial nerve deficit, gait and coordination normal and speech normal  Psychiatric: appropriate, oriented to person, place and time/date  Skin: Skin color, texture, turgor normal. No rashes or  lesions.  ??  Assessment and Plan:  ??  (1) Acute Crohn's exacerbation:  GI following, abx D/C'ed, continue IV Solu-Medrol  (2) ?early vs. PSBO vs. Ileus: Patient has hx several SBO in past s/p resection; c/o worsening pain today, +nausea with clear liquids. Make NPO, Surgery consulted, repeat Acute abdominal series  (3) Hx MS  (4) Peripheral neuropathy: Lyrica  ??    Kally Cadden Shirlean Schlein PA-C

## 2015-01-01 NOTE — Consults (Signed)
Department of General Surgery Consult    PATIENT NAME: Dennis Rivas   DATE OF BIRTH: 12/06/1965    ADMISSION DATE: 12/29/2014  6:11 PM      TODAY'S DATE: 01/01/2015    Reason for Consult:  Crohn's exacerbation    HISTORY OF PRESENT ILLNESS:              The patient is a 49 y.o. male who presents with acute onset of abdominal pain approximately 24 hours ago.  Patient is resting comfortably in his hospital bed watching TV.  On questioning today he states the pain has subsided and he had a bowel movement earlier today.  He has history of long-standing Crohn's disease and has undergone multiple operations.  He states [place] out [place] and [place] back to the area recently and was undergoing IV therapy for his Crohn's, however has missed the past 2 months of therapy and developed recurrent pain.  Denies any fevers chills nausea or vomiting.  No blood in his stools.  No other complaints at this time.  Please see below for labs and radiology results.  Thoroughly reviewed the patient's medical history, family history, social history and review of systems with the patient today in the office.  Please see medical record for pertinent positives.     Past Medical History:        Diagnosis Date   ??? Anemia    ??? Crohn's disease (HCC)    ??? Multiple sclerosis (HCC)        Past Surgical History:        Procedure Laterality Date   ??? Abdomen surgery     ??? Appendectomy     ??? Small intestine surgery     ??? Hernia repair     ??? Knee surgery         Current Medications:   Current Facility-Administered Medications: HYDROmorphone (DILAUDID) injection 0.5 mg, 0.5 mg, Intravenous, Q4H PRN  hydrALAZINE (APRESOLINE) injection 10 mg, 10 mg, Intravenous, Q6H PRN  HYDROmorphone (DILAUDID) injection 1 mg, 1 mg, Intravenous, Q4H PRN  oxyCODONE-acetaminophen (PERCOCET) 5-325 MG per tablet 2 tablet, 2 tablet, Oral, Q4H PRN  sodium chloride flush 0.9 % injection 10 mL, 10 mL, Intravenous, 2 times per day  sodium chloride flush 0.9 %  injection 10 mL, 10 mL, Intravenous, PRN  acetaminophen (TYLENOL) tablet 650 mg, 650 mg, Oral, Q4H PRN  magnesium hydroxide (MILK OF MAGNESIA) 400 MG/5ML suspension 30 mL, 30 mL, Oral, Daily PRN  bisacodyl (DULCOLAX) suppository 10 mg, 10 mg, Rectal, Daily PRN  enoxaparin (LOVENOX) injection 40 mg, 40 mg, Subcutaneous, Daily  ondansetron (ZOFRAN) injection 4 mg, 4 mg, Intravenous, Q6H PRN  pantoprazole (PROTONIX) injection 40 mg, 40 mg, Intravenous, Daily **AND** sodium chloride (PF) 0.9 % injection 10 mL, 10 mL, Intravenous, Daily  0.9 % sodium chloride infusion, , Intravenous, Continuous  methylPREDNISolone sodium (SOLU-MEDROL) injection 60 mg, 60 mg, Intravenous, 4 times per day  pregabalin (LYRICA) capsule 150 mg, 150 mg, Oral, BID  therapeutic multivitamin-minerals 1 tablet, 1 tablet, Oral, Daily  melatonin tablet 3 mg, 3 mg, Oral, Nightly PRN  Prior to Admission medications    Medication Sig Start Date End Date Taking? Authorizing Provider   amphetamine-dextroamphetamine (ADDERALL, 10MG ,) 10 MG tablet Take 10 mg by mouth 2 times daily   Yes Historical Provider, MD   Promethazine HCl (PHENERGAN PO) Take 25 mg by mouth as needed   Yes Historical Provider, MD   Multiple Vitamins-Minerals (THERAPEUTIC MULTIVITAMIN-MINERALS) tablet  Take 1 tablet by mouth daily   Yes Historical Provider, MD   Vedolizumab (ENTYVIO IV) Infuse intravenously Indications: every other month. last infusioon 09/23/14    Historical Provider, MD        Allergies:  Morphine    Social History:   Social History     Social History   ??? Marital status: Single     Spouse name: N/A   ??? Number of children: N/A   ??? Years of education: N/A     Occupational History   ??? Not on file.     Social History Main Topics   ??? Smoking status: Current Every Day Smoker     Types: Cigars   ??? Smokeless tobacco: Not on file   ??? Alcohol use Not on file   ??? Drug use: No   ??? Sexual activity: Not on file     Other Topics Concern   ??? Not on file     Social History Narrative        Family History:    History reviewed. No pertinent family history.    REVIEW OF SYSTEMS:  CONSTITUTIONAL:  Negative for fatigue, and unexpected weight change  HENT: Negative for hearing loss, nosebleeds, sneezing, sore throat, trouble swallowing and voice change.  RESPIRATORY:  Negative for cough, SOB, and wheezing  CARDIOVASCULAR:  Negative for chest pains and palpatations  GASTROINTESTINAL:   Diffuse abdominal pain improving since admission  GENITOURINARY: negative for dysuria, urgency, frequency, and difficulty urinating.  SKIN: negative for rash  ALLERGIC/IMMUNOLOGIC: Negative for immunocompromised state   HEMATOLOGIC/LYMPHATIC:  Negative for adenopathy. Does not bruise/bleed easily.  NEUROLOGICAL:  Negative for seizures and syncope     * All other ROS reviewed see HPI for pertinent positives and negatives.       PHYSICAL EXAM:  VITALS:    Visit Vitals   ??? BP (!) 173/93   ??? Pulse 67   ??? Temp 98.2 ??F (36.8 ??C) (Oral)   ??? Resp 18   ??? Ht 6' (1.829 m)   ??? Wt 217 lb (98.4 kg)   ??? SpO2 98%   ??? BMI 29.43 kg/m2     24HR INTAKE/OUTPUT:    I/O last 3 completed shifts:  In: 550 [P.O.:550]  Out: 900 [Urine:900]       CONSTITUTIONAL:  Oriented to person, place, and time. Appears well nourished. No distress  EYES:  PERRL,   ENT:  Normocepalic,atraumatic, without obvious abnormality  NECK:  supple, symmetrical, trachea midline  LUNGS: Resp effort easy and unlabored, breath sounds normal  CARDIOVASCULAR:  NO JVD, RRR, No murmur  ABDOMEN:  soft, non-distended, non-tender, peritoneal signs absent, no masses palpated and hernia absent  MUSCULOSKELETAL: Normal range of motion  NEUROLOGIC:  Mental Status Exam:  Level of Alertness:   awake  PSYCHIATRIC:   Speech is normal  SKIN:  Warm, dry, and intact    DATA:    CBC:   Recent Labs      12/30/14   0556  12/31/14   0913  01/01/15   0436   WBC  12.3*  12.1*  13.0*   HGB  10.5*  10.2*  10.5*   HCT  33.6*  32.4*  34.0*   PLT  359  360  417     BMP:    Recent Labs      12/30/14    0556  12/31/14   0913  01/01/15   0436   NA  139  141  142  K  4.4  3.9  4.6   CL  107  108  107   CO2  BUN  CREATININE  1.16  1.12  0.99   GLUCOSE  121*  152*  96     Hepatic: No results for input(s): AST, ALT, ALB, BILITOT, ALKPHOS in the last 72 hours.  Mag:    No results for input(s): MG in the last 72 hours.   Phos:   No results for input(s): PHOS in the last 72 hours.   INR: No results for input(s): INR in the last 72 hours.      IMPRESSION/RECOMMENDATIONS:    Crohn's exacerbation: Improving  No acute surgical issues  Continue medical treatment per gastrointestinal  will continue to follow closely  Patient counseled on risks, benefits, and alternatives of treatment plan at length. Patient states an understanding and willingness to proceed with plan.      Juanito Doom  MD

## 2015-01-01 NOTE — Progress Notes (Signed)
Patient c/o sudden onset, stabbing right sided abdominal pain following ingestion of liquids.  [facility] NP notified and rounded on patient.  New orders given and initiated.

## 2015-01-01 NOTE — Plan of Care (Signed)
Problem: Discharge Planning:  Goal: Discharged to appropriate level of care  Discharged to appropriate level of care   Outcome: Ongoing

## 2015-01-01 NOTE — Progress Notes (Signed)
Nutrition Assessment    Type and Reason for Visit: Reassess    Nutrition Recommendations:  Currently made npo with surgery consult. If surgery required, may require parenteral support ( 1700 cal, 80 grams protein,510 cal lipid,870 cal carb). wil resume oral supplements when diet,status allow. Suggest albumin    Malnutrition Assessment:  ?? Malnutrition Status: At risk for malnutrition  ?? Context: Acute illness or injury (acute on chronic)  ?? Findings of the 6 clinical characteristics of malnutrition (Minimum of 2 out of 6 clinical characteristics is required to make the diagnosis of moderate or severe Protein Calorie Malnutrition based on AND/ASPEN Guidelines):  1. Energy Intake-Not available, not able to assess    2. Weight Loss-10% loss or greater,  (13.2% in 6 months per pt reports )  3. Fat Loss-Unable to assess,    4. Muscle Loss-Unable to assess,    5. Fluid Accumulation-Unable to assess,    6. Grip Strength-Not measured    Nutrition Diagnosis:   ?? Problem: Inadequate oral intake, Altered GI function, Unintended weight loss (was not on meds for crohns- to go to [facility]  to restart entivio)  ?? Etiology: related to Prolonged catabolic illness, Alteration in GI function, Nausea, Diarrhea, Psychological cause/life stress (recent decreased stool)    ??? Signs and symptoms:  as evidenced by NPO status due to medical condition, Diet history of poor intake, Weight loss greater than or equal to 10% in 6 months, Diarrhea, GI abnormality, Nausea    Nutrition Assessment:  ?? Subjective Assessment: patient reports abdominal pain is worse today and less diarrhea and rectal output . was made npo for surgery consult for ileus - suspect small bowel obstruction. reports no appetite  ?? Nutrition-Focused Physical Findings: alert . states likes berry ensure clear better than apple. noted in healthouch. had berry ensure enlive at bedside . instructed nto to drink  ?? Wound Type: None  ?? Current Nutrition Therapies:  ?? Oral  Diet Orders: NPO   ?? Oral Diet intake: 76-100%  ?? Oral Nutrition Supplement (ONS) Orders: None  ?? ONS intake: Unable to assess  ?? Anthropometric Measures:  ?? Ht: 6' (182.9 cm)   ?? Current Body Wt: 217 lb (98.4 kg)  ?? Admission Body Wt: 217 lb (98.4 kg)  ?? Usual Body Wt: 250 lb (113.4 kg) (Pt wt frequently fluctuates. Pt had trouble identifying a UBW. Pt reported wts from 250-280# in a one year time frame.  Pt did quantify weighting 250# around 6 months ago. )  ?? % Weight Change:  ,  6 months  (From pt reports, 250# to 217# - indicating a 33# weight loss within 6 month time frame )  ?? Ideal Body Wt: 142 lb (64.4 kg), % Ideal Body 152%   ?? Adjusted Body Wt:  , body weight adjusted for    ?? BMI Classification: BMI 25.0 - 29.9 Overweight  ?? Comparative Standards (Estimated Nutrition Needs):  ?? Estimated Daily Total Kcal: 1612-1935  ?? Estimated Daily Protein (g): 65-77    Estimated Intake vs Estimated Needs: Intake Less Than Needs    Nutrition Risk Level: High    Nutrition Interventions:   Continue NPO (await surgery  input)  Continued Inpatient Monitoring    Nutrition Evaluation:   ?? Evaluation: No progress toward goals   ?? Goals: will receive adequate nutrition . when on oral diet will achieve 75% intake of diet and /or supplements    ?? Monitoring: NPO Status, Diet Progression, Weight Status, Pertinent Labs,  Nausea or Vomiting, Diarrhea    See Adult Nutrition Doc Flowsheet for more detail.     Electronically signed by Majel Homer, RD, LD on 01/01/15 at 12:16 PM    Contact Number: extension [phone]

## 2015-01-01 NOTE — Care Coordination-Inpatient (Signed)
AWAITING SURGICAL CONSULT, NPO, DISCHARGE PLAN REMAINS HOME.

## 2015-01-01 NOTE — Plan of Care (Signed)
Problem: Discharge Planning:  Goal: Participates in care planning  Participates in care planning   Outcome: Ongoing

## 2015-01-01 NOTE — Progress Notes (Signed)
HPI   Dennis Rivas is a 49 y.o. male who we are following on the inpatient service for abdominal pain. Stool culture was negative.  Complaints of pain persist especially if he eats. NPO till seen by Surgery. Single view abdomen xray ordered and pending. He complains of right sided pain. He had gas rectally last night and small brown water stool earlier today. His blood pressure is up this morning. WBC 13 today, likely related to the steroids. Patient states that pain is worse today than yesterday.         Diagnosis Date   ??? Anemia    ??? Crohn's disease (HCC)    ??? Multiple sclerosis (HCC)          Procedure Laterality Date   ??? Abdomen surgery     ??? Appendectomy     ??? Small intestine surgery     ??? Hernia repair     ??? Knee surgery       Current Facility-Administered Medications   Medication Dose Route Frequency Provider Last Rate Last Dose   ??? HYDROmorphone (DILAUDID) injection 0.5 mg  0.5 mg Intravenous Q4H PRN Otilio Saber, PA   0.5 mg at 01/01/15 1051   ??? hydrALAZINE (APRESOLINE) injection 10 mg  10 mg Intravenous Q6H PRN Lauren Arvella Merles, PA       ??? oxyCODONE-acetaminophen (PERCOCET) 5-325 MG per tablet 2 tablet  2 tablet Oral Q4H PRN Virgel Manifold, DO   2 tablet at 01/01/15 0841   ??? sodium chloride flush 0.9 % injection 10 mL  10 mL Intravenous 2 times per day Dorcas Mcmurray, MD   10 mL at 12/29/14 2054   ??? sodium chloride flush 0.9 % injection 10 mL  10 mL Intravenous PRN Shreebatsa Dhital, MD       ??? acetaminophen (TYLENOL) tablet 650 mg  650 mg Oral Q4H PRN Shreebatsa Dhital, MD       ??? magnesium hydroxide (MILK OF MAGNESIA) 400 MG/5ML suspension 30 mL  30 mL Oral Daily PRN Shreebatsa Dhital, MD       ??? bisacodyl (DULCOLAX) suppository 10 mg  10 mg Rectal Daily PRN Dorcas Mcmurray, MD       ??? enoxaparin (LOVENOX) injection 40 mg  40 mg Subcutaneous Daily Shreebatsa Dhital, MD   40 mg at 12/30/14 0804   ??? ondansetron (ZOFRAN) injection 4 mg  4 mg Intravenous Q6H PRN  Shreebatsa Dhital, MD   4 mg at 12/31/14 1056   ??? pantoprazole (PROTONIX) injection 40 mg  40 mg Intravenous Daily Shreebatsa Dhital, MD   40 mg at 01/01/15 0820    And   ??? sodium chloride (PF) 0.9 % injection 10 mL  10 mL Intravenous Daily Shreebatsa Dhital, MD   10 mL at 12/30/14 0810   ??? 0.9 % sodium chloride infusion   Intravenous Continuous Shreebatsa Dhital, MD 75 mL/hr at 12/31/14 2104     ??? methylPREDNISolone sodium (SOLU-MEDROL) injection 60 mg  60 mg Intravenous 4 times per day Dorcas Mcmurray, MD   60 mg at 01/01/15 4782   ??? pregabalin (LYRICA) capsule 150 mg  150 mg Oral BID Shreebatsa Dhital, MD   150 mg at 01/01/15 0820   ??? therapeutic multivitamin-minerals 1 tablet  1 tablet Oral Daily Shreebatsa Dhital, MD   1 tablet at 01/01/15 0930   ??? melatonin tablet 3 mg  3 mg Oral Nightly PRN Dorcas Mcmurray, MD         Allergies  Allergen Reactions   ??? Morphine         Visit Vitals   ??? BP (!) 161/97   ??? Pulse 67   ??? Temp 97.4 ??F (36.3 ??C)   ??? Resp 19   ??? Ht 6' (1.829 m)   ??? Wt 217 lb (98.4 kg)   ??? SpO2 96%   ??? BMI 29.43 kg/m2     Gen:   Well nourished, no acute distress  HEENT: EOMI, no pallor, no scleral icteris  CVS:   Regular rate and rhythm, no murmurs, pulses 2+ b/l  Resp:   Normal respiratory effort, clear to ascultation b/l  Skin:   No rashes or lesions  Neuro: Moving all extremities without difficulty, no gross defects  Lymph: No lymphadenopathy in axillary or inguinal areas  Psych:  Normal mood and affect  Abdo:   +BS, soft, Right sided tenderness, non distended, no rebound or guarding,     Laboratory  Recent Labs      12/30/14   0556  12/31/14   0913  01/01/15   0436   NA  139  141  142   K  4.4  3.9  4.6   CL  107  108  107   CO2  25  25  29    BUN  12  16  15    CREATININE  1.16  1.12  0.99   GLUCOSE  121*  152*  96   CALCIUM  8.1*  8.2  8.5     Recent Labs      01/01/15   0436  12/31/14   0913  12/30/14   0556   WBC  13.0*  12.1*  12.3*   HGB  10.5*  10.2*  10.5*   HCT  34.0*  32.4*  33.6*    MCV  67.5*  66.6*  66.3*   PLT  417  360  359     This is a 50 y.o. male consulted for     Assessment:  1 Crohns disease  ??2 Ileus vs PSBO ??  ??  Plan:  1 continue Solumedrol  2 Surgery consult pending.     Dr Tasia Catchings will be taking over service 9/3    Electronically signed by Domingo Sep, CNP on 01/01/2015 at 11:10 AM     I have evaluated the patient and reviewed the case with [facility]. I agree with the current plan of care including the workup, evaluation, management, and diagnosis. Care plan has been discussed. The above documentation has been reviewed and edited as needed to reflect the findings of my evaluation.  Electronically signed by Learta Codding, MD on 01/01/2015 at 1:49 PM

## 2015-01-01 NOTE — Progress Notes (Signed)
Blood pressure recheck noted to be 123/74.  Patient appears comfortable at this time.

## 2015-01-02 LAB — BASIC METABOLIC PANEL
Anion Gap: 10 NA
BUN: 17 mg/dL (ref 7–25)
CO2: 24 mmol/L (ref 21–32)
Calcium: 8 mg/dL — ABNORMAL LOW (ref 8.2–10.1)
Chloride: 107 mmol/L (ref 98–109)
Creatinine: 1.03 mg/dL (ref 0.55–1.40)
EGFR IF NonAfrican American: >60 mL/min (ref >60)
Glucose: 100 mg/dL (ref 70–100)
Potassium: 4.5 mmol/L (ref 3.5–5.1)
Sodium: 141 mmol/L (ref 135–145)
eGFR African American: >60 mL/min (ref >60)

## 2015-01-02 LAB — CBC
Hematocrit: 33.1 % — ABNORMAL LOW (ref 40.0–52.0)
Hemoglobin: 10.4 g/dL — ABNORMAL LOW (ref 13.0–18.0)
MCH: 21.3 pg — ABNORMAL LOW (ref 26.0–34.0)
MCHC: 31.5 % — ABNORMAL LOW (ref 32.0–36.0)
MCV: 67.5 fL — ABNORMAL LOW (ref 80.0–98.0)
MPV: 7.4 fL (ref 7.4–10.4)
Platelets: 337 10*3/uL (ref 140–440)
RBC: 4.91 10*6/uL (ref 4.40–5.90)
RDW: 18.2 % — ABNORMAL HIGH (ref 11.5–14.5)
WBC: 9.5 10*3/uL (ref 3.6–10.7)

## 2015-01-02 LAB — LACTIC ACID: Lactic Acid: 2.1 mmol/L — ABNORMAL HIGH (ref 0.4–2.0)

## 2015-01-02 MED ORDER — IOPAMIDOL 76 % IV SOLN
76 % | Freq: Once | INTRAVENOUS | Status: AC | PRN
Start: 2015-01-02 — End: 2015-01-01
  Administered 2015-01-02: 02:00:00 75 mL via INTRAVENOUS

## 2015-01-02 MED FILL — OXYCODONE-ACETAMINOPHEN 5-325 MG PO TABS: 5-325 MG | ORAL | Qty: 2

## 2015-01-02 MED FILL — THERA-M PO TABS: ORAL | Qty: 1

## 2015-01-02 MED FILL — SOLU-MEDROL 125 MG IJ SOLR: 125 mg | INTRAMUSCULAR | Qty: 125

## 2015-01-02 MED FILL — ONDANSETRON HCL 4 MG/2ML IJ SOLN: 4 MG/2ML | INTRAMUSCULAR | Qty: 2

## 2015-01-02 MED FILL — LOVENOX 40 MG/0.4ML SC SOLN: 40 MG/0.4ML | SUBCUTANEOUS | Qty: 0.4

## 2015-01-02 MED FILL — PROTONIX 40 MG IV SOLR: 40 mg | INTRAVENOUS | Qty: 40

## 2015-01-02 MED FILL — HYDROMORPHONE HCL 1 MG/ML IJ SOLN: 1 MG/ML | INTRAMUSCULAR | Qty: 1

## 2015-01-02 MED FILL — SOLU-MEDROL 125 MG IJ SOLR: 125 MG | INTRAMUSCULAR | Qty: 125

## 2015-01-02 MED FILL — LYRICA 75 MG PO CAPS: 75 mg | ORAL | Qty: 2

## 2015-01-02 NOTE — Progress Notes (Signed)
GENERAL SURGERY Progress Note    PATIENT NAME: Dennis Rivas     TODAY'S DATE: 01/02/2015    SUBJECTIVE:  Pain controlled Yes  Other Complaints No  Flatus/BM/or Ostomy function Yes    He's doing well, and was happy to have clears this am. Mild cramping after diet, but he thinks he ate too much. +BM this am and lots of gas. Very comfortable, watching TV. No tachycardia.    CT from 01/01/15 reviewed.     IMPRESSION: ??Previously noted segment of thick-walled distal small bowel is    no    longer identified. No other region of inflammatory change, mass or fluid    collection identified at this time      GI and GS consult notes reviewed.    OBJECTIVE:  VITALS:    Visit Vitals   ??? BP 150/84   ??? Pulse 55   ??? Temp 97.8 ??F (36.6 ??C) (Oral)   ??? Resp 17   ??? Ht 6' (1.829 m)   ??? Wt 217 lb (98.4 kg)   ??? SpO2 92%   ??? BMI 29.43 kg/m2       INTAKE/OUTPUT:    I/O last 3 completed shifts:  In: 200 [P.O.:200]  Out: -        REVIEW OF SYSTEMS:  Pertinent positives and negatives as per interval history section    PHYSICAL EXAM:  CONSTITUTIONAL:  A&O X3.   LUNGS: Resp effort easy and unlabored  CV: Regular  ABDOMEN: soft, non-distended, non-tender, Peritoneal signs absent, numerous healed scars, no open wounds.    Data:  CBC:   Recent Labs      12/31/14   0913  01/01/15   0436  01/02/15   0622   WBC  12.1*  13.0*  9.5   HGB  10.2*  10.5*  10.4*   HCT  32.4*  34.0*  33.1*   PLT  360  417  337     BMP:    Recent Labs      12/31/14   0913  01/01/15   0436  01/02/15   0440   NA  141  142  141   K  3.9  4.6  4.5   CL  108  107  107   CO2  BUN  CREATININE  1.12  0.99  1.03   GLUCOSE  152*  96  100     Hepatic: No results for input(s): AST, ALT, ALB, BILITOT, ALKPHOS in the last 72 hours.      ASSESSMENT AND PLAN:    Crohn's exacerbation -  Continue IV solumedrol.  Continue Clears, plan Full liquids 01/03/15  D/w Dr. Tasia Catchings.     Patient counseled on risks, benefits, and alternatives of treatment plan today. Patient  states an understanding and willingness to proceed with plan.    Electronically signed by Charlyn Minerva, MD on 01/02/2015 at 3:28 PM

## 2015-01-02 NOTE — Plan of Care (Signed)
Problem: Pain:  Goal: Control of chronic pain  Control of chronic pain   Outcome: Ongoing

## 2015-01-02 NOTE — Plan of Care (Signed)
Problem: Discharge Planning:  Goal: Participates in care planning  Participates in care planning   Outcome: Ongoing  Goal: Discharged to appropriate level of care  Discharged to appropriate level of care   Outcome: Ongoing    Problem: Anxiety/Stress:  Goal: Level of anxiety will decrease  Level of anxiety will decrease   Outcome: Ongoing    Problem: Bowel Function - Altered:  Goal: Bowel elimination is within specified parameters  Bowel elimination is within specified parameters   Outcome: Ongoing    Problem: Pain:  Goal: Pain level will decrease  Pain level will decrease   Outcome: Ongoing  Goal: Ability to notify healthcare provider of pain before it becomes unmanageable or unbearable will improve  Ability to notify healthcare provider of pain before it becomes unmanageable or unbearable will improve   Outcome: Ongoing  Goal: Control of acute pain  Control of acute pain   Outcome: Ongoing  Goal: Control of chronic pain  Control of chronic pain   Outcome: Ongoing

## 2015-01-02 NOTE — Plan of Care (Signed)
Problem: Pain:  Goal: Ability to notify healthcare provider of pain before it becomes unmanageable or unbearable will improve  Ability to notify healthcare provider of pain before it becomes unmanageable or unbearable will improve   Outcome: Ongoing

## 2015-01-02 NOTE — Plan of Care (Signed)
Problem: Pain:  Goal: Pain level will decrease  Pain level will decrease   Outcome: Ongoing

## 2015-01-02 NOTE — Plan of Care (Signed)
Problem: Discharge Planning:  Goal: Discharged to appropriate level of care  Discharged to appropriate level of care   Outcome: Ongoing

## 2015-01-02 NOTE — Progress Notes (Addendum)
Subjective: Last PM, patient's diet was advanced to clear liquids and immediately after patient was crying in pain, 10/10 right sided abdomen.  Repeat CT scan was ordered that showed improvement in dilated bowel loops. Patient made NPO again last PM. Patient reports small BM this AM, +flatus.     Objective:     VS:   Visit Vitals   ??? BP 150/84   ??? Pulse 55   ??? Temp 97.8 ??F (36.6 ??C) (Oral)   ??? Resp 17   ??? Ht 6' (1.829 m)   ??? Wt 217 lb (98.4 kg)   ??? SpO2 92%   ??? BMI 29.43 kg/m2       Labs:    Recent Results (from the past 24 hour(s))   Basic Metabolic Panel    Collection Time: 01/02/15  4:40 AM   Result Value Ref Range    Sodium 141 135 - 145 mmol/L    Potassium 4.5 3.5 - 5.1 mmol/L    Chloride 107 98 - 109 mmol/L    CO2 24 21 - 32 mmol/L    Anion Gap 10 NA    Glucose 100 70 - 100 mg/dL    BUN 17 7 - 25 mg/dL    CREATININE 1.03 0.55 - 1.40 mg/dL    eGFR African American >60.0 >60 mL/min    EGFR IF NonAfrican American >60.0 >60 mL/min    Calcium 8.0 (L) 8.2 - 10.1 mg/dL   Lactic Acid, Plasma    Collection Time: 01/02/15  6:22 AM   Result Value Ref Range    Lactic Acid 2.1 (H) 0.4 - 2.0 mmol/L   CBC    Collection Time: 01/02/15  6:22 AM   Result Value Ref Range    WBC 9.5 3.6 - 10.7 10*3/uL    RBC 4.91 4.40 - 5.90 10*6/uL    Hemoglobin 10.4 (L) 13.0 - 18.0 g/dL    Hematocrit 33.1 (L) 40.0 - 52.0 %    MCV 67.5 (L) 80.0 - 98.0 fL    MCH 21.3 (L) 26.0 - 34.0 pg    MCHC 31.5 (L) 32.0 - 36.0 %    RDW 18.2 (H) 11.5 - 14.5 %    Platelets 337 140 - 440 10*3/uL    MPV 7.4 7.4 - 10.4 fL     Urine Culture:  No results found for this or any previous visit.    Physical Examination:  General appearance: in no apparent distress and non-toxic  HEENT: Head: Normocephalic, no lesions, without obvious abnormality.  Heart: regular rate and rhythm, S1, S2 normal, no murmur, click, rub or gallop  Lungs: clear to auscultation bilaterally  Abdomen: Abd soft, BS heard. TTP right sided abdomen  Extremities: extremities normal, atraumatic, no  cyanosis or edema  Musculoskeletal: No joint swelling, deformity, or tenderness.  Neurologic: no cranial nerve deficit, gait and coordination normal and speech normal  Psychiatric: appropriate, oriented to person, place and time/date  Skin: Skin color, texture, turgor normal. No rashes or lesions.  ????  Assessment and Plan:  ????  (1) Acute Crohn's exacerbation:  GI following, abx D/C'ed, continue IV Solu-Medrol. Diet advancement cautiously  (2) ?early vs. PSBO vs. Ileus: repeat CT scan yesterday shows improvement. Surgery following.   (3) Hx MS  (4) Peripheral neuropathy: Lyrica  ????  Dany Walther Shirlean Schlein PA-C

## 2015-01-02 NOTE — Plan of Care (Signed)
Problem: Bowel Function - Altered:  Goal: Bowel elimination is within specified parameters  Bowel elimination is within specified parameters   Outcome: Ongoing

## 2015-01-02 NOTE — Plan of Care (Signed)
Problem: Discharge Planning:  Goal: Participates in care planning  Participates in care planning   Outcome: Ongoing

## 2015-01-02 NOTE — Progress Notes (Signed)
Department of Gastroenterology      SUBJECTIVE:  Small bowel obstruction, feeling better had passed Bowel movement and wants to eat    OBJECTIVE:      REVIEW OF SYSTEMS  unchanged    Medications    Current Facility-Administered Medications: HYDROmorphone (DILAUDID) injection 0.5 mg, 0.5 mg, Intravenous, Q4H PRN  hydrALAZINE (APRESOLINE) injection 10 mg, 10 mg, Intravenous, Q6H PRN  HYDROmorphone (DILAUDID) injection 1 mg, 1 mg, Intravenous, Q4H PRN  oxyCODONE-acetaminophen (PERCOCET) 5-325 MG per tablet 2 tablet, 2 tablet, Oral, Q4H PRN  sodium chloride flush 0.9 % injection 10 mL, 10 mL, Intravenous, 2 times per day  sodium chloride flush 0.9 % injection 10 mL, 10 mL, Intravenous, PRN  acetaminophen (TYLENOL) tablet 650 mg, 650 mg, Oral, Q4H PRN  magnesium hydroxide (MILK OF MAGNESIA) 400 MG/5ML suspension 30 mL, 30 mL, Oral, Daily PRN  bisacodyl (DULCOLAX) suppository 10 mg, 10 mg, Rectal, Daily PRN  enoxaparin (LOVENOX) injection 40 mg, 40 mg, Subcutaneous, Daily  ondansetron (ZOFRAN) injection 4 mg, 4 mg, Intravenous, Q6H PRN  pantoprazole (PROTONIX) injection 40 mg, 40 mg, Intravenous, Daily **AND** sodium chloride (PF) 0.9 % injection 10 mL, 10 mL, Intravenous, Daily  0.9 % sodium chloride infusion, , Intravenous, Continuous  methylPREDNISolone sodium (SOLU-MEDROL) injection 60 mg, 60 mg, Intravenous, 4 times per day  pregabalin (LYRICA) capsule 150 mg, 150 mg, Oral, BID  therapeutic multivitamin-minerals 1 tablet, 1 tablet, Oral, Daily  melatonin tablet 3 mg, 3 mg, Oral, Nightly PRN  Physical    Gen:   Well nourished, no acute distress  HEENT: EOMI, no pallor, no scleral icteris  CVS:   Regular rate and rhythm, no murmurs, pulses 2+ b/l  Resp:   Normal respiratory effort, clear to ascultation b/l  Skin:   No rashes or lesions  Neuro:  Moving all extremities without difficulty, no gross defects  Lymph: No lymphadenopathy in axillary or inguinal areas  Psych:  Normal mood and affect  Musc:  Normal Gait, Full  Range of Motion  Lymphatic: No palpable lymphadenopathy in cervical or inguinal areas    Abdo:   +BS, soft, non tender, non distended, no rebound or guarding.    Data    Radiology Review:  CT scan reviewed improved    ASSESSMENT    1. Chron's disease S/P  Multiple abdominal surgeries    Patient Active Problem List  Principal Problem:    Crohn's colitis (HCC)  Active Problems:    Multiple sclerosis (HCC)        PLAN      1. Clear  Liquid diet   2. Continue IV solumedrol      Consuella Lose, MD

## 2015-01-02 NOTE — Plan of Care (Signed)
Problem: Pain:  Goal: Control of acute pain  Control of acute pain   Outcome: Ongoing

## 2015-01-02 NOTE — Plan of Care (Signed)
Problem: Anxiety/Stress:  Goal: Level of anxiety will decrease  Level of anxiety will decrease   Outcome: Ongoing

## 2015-01-02 NOTE — Plan of Care (Signed)
Problem: Discharge Planning:  Goal: Participates in care planning  Participates in care planning   Outcome: Ongoing  Goal: Discharged to appropriate level of care  Discharged to appropriate level of care   Outcome: Ongoing    Problem: Bowel Function - Altered:  Goal: Bowel elimination is within specified parameters  Bowel elimination is within specified parameters   Outcome: Ongoing    Problem: Pain:  Goal: Pain level will decrease  Pain level will decrease   Outcome: Ongoing  Goal: Ability to notify healthcare provider of pain before it becomes unmanageable or unbearable will improve  Ability to notify healthcare provider of pain before it becomes unmanageable or unbearable will improve   Outcome: Ongoing  Goal: Control of acute pain  Control of acute pain   Outcome: Ongoing  Goal: Control of chronic pain  Control of chronic pain   Outcome: Ongoing

## 2015-01-03 LAB — BASIC METABOLIC PANEL
Anion Gap: 6 NA
BUN: 21 mg/dL (ref 7–25)
CO2: 30 mmol/L (ref 21–32)
Calcium: 8 mg/dL — ABNORMAL LOW (ref 8.2–10.1)
Chloride: 106 mmol/L (ref 98–109)
Creatinine: 1.09 mg/dL (ref 0.55–1.40)
EGFR IF NonAfrican American: >60 mL/min (ref >60)
Glucose: 109 mg/dL — ABNORMAL HIGH (ref 70–100)
Potassium: 4.9 mmol/L (ref 3.5–5.1)
Sodium: 142 mmol/L (ref 135–145)
eGFR African American: >60 mL/min (ref >60)

## 2015-01-03 LAB — CBC
Hematocrit: 32.8 % — ABNORMAL LOW (ref 40.0–52.0)
Hemoglobin: 10.2 g/dL — ABNORMAL LOW (ref 13.0–18.0)
MCH: 20.8 pg — ABNORMAL LOW (ref 26.0–34.0)
MCHC: 31.2 % — ABNORMAL LOW (ref 32.0–36.0)
MCV: 66.7 fL — ABNORMAL LOW (ref 80.0–98.0)
MPV: 7.4 fL (ref 7.4–10.4)
Platelets: 302 10*3/uL (ref 140–440)
RBC: 4.92 10*6/uL (ref 4.40–5.90)
RDW: 18.3 % — ABNORMAL HIGH (ref 11.5–14.5)
WBC: 9.8 10*3/uL (ref 3.6–10.7)

## 2015-01-03 LAB — HEMOGLOBIN AND HEMATOCRIT
Hematocrit: 35.8 % — ABNORMAL LOW (ref 40.0–52.0)
Hemoglobin: 11.1 g/dL — ABNORMAL LOW (ref 13.0–18.0)

## 2015-01-03 MED ORDER — PREDNISONE 20 MG PO TABS
20 MG | Freq: Every day | ORAL | Status: DC
Start: 2015-01-03 — End: 2015-01-04
  Administered 2015-01-03 – 2015-01-04 (×2): 30 mg via ORAL

## 2015-01-03 MED FILL — PREDNISONE 10 MG PO TABS: 10 MG | ORAL | Qty: 1

## 2015-01-03 MED FILL — PROTONIX 40 MG IV SOLR: 40 MG | INTRAVENOUS | Qty: 40

## 2015-01-03 MED FILL — ONDANSETRON HCL 4 MG/2ML IJ SOLN: 4 MG/2ML | INTRAMUSCULAR | Qty: 2

## 2015-01-03 MED FILL — HYDROMORPHONE HCL 1 MG/ML IJ SOLN: 1 MG/ML | INTRAMUSCULAR | Qty: 1

## 2015-01-03 MED FILL — OXYCODONE-ACETAMINOPHEN 5-325 MG PO TABS: 5-325 MG | ORAL | Qty: 2

## 2015-01-03 MED FILL — LYRICA 75 MG PO CAPS: 75 mg | ORAL | Qty: 2

## 2015-01-03 MED FILL — LOVENOX 40 MG/0.4ML SC SOLN: 40 MG/0.4ML | SUBCUTANEOUS | Qty: 0.4

## 2015-01-03 MED FILL — THERA-M PO TABS: ORAL | Qty: 1

## 2015-01-03 NOTE — Plan of Care (Signed)
Problem: Discharge Planning:  Goal: Participates in care planning  Participates in care planning   Outcome: Not Met This Shift  Goal: Discharged to appropriate level of care  Discharged to appropriate level of care   Outcome: Not Met This Shift    Problem: Anxiety/Stress:  Goal: Level of anxiety will decrease  Level of anxiety will decrease   Outcome: Ongoing    Problem: Bowel Function - Altered:  Goal: Bowel elimination is within specified parameters  Bowel elimination is within specified parameters   Outcome: Ongoing    Problem: Pain:  Goal: Pain level will decrease  Pain level will decrease   Outcome: Ongoing  Goal: Ability to notify healthcare provider of pain before it becomes unmanageable or unbearable will improve  Ability to notify healthcare provider of pain before it becomes unmanageable or unbearable will improve   Outcome: Ongoing  Goal: Control of acute pain  Control of acute pain   Outcome: Ongoing  Goal: Control of chronic pain  Control of chronic pain   Outcome: Not Met This Shift

## 2015-01-03 NOTE — Progress Notes (Addendum)
Subjective: Pain much improved today. Patient walking up and down halls, appears comfortable upon exam. Tolerated clear liquids yesterday, wants to eat more.     Objective:     VS:   Visit Vitals   ??? BP (!) 148/95   ??? Pulse 64   ??? Temp 97.7 ??F (36.5 ??C)   ??? Resp 18   ??? Ht 6' (1.829 m)   ??? Wt 217 lb (98.4 kg)   ??? SpO2 94%   ??? BMI 29.43 kg/m2       Labs:    Recent Results (from the past 24 hour(s))   CBC    Collection Time: 01/03/15  4:55 AM   Result Value Ref Range    WBC 9.8 3.6 - 10.7 10*3/uL    RBC 4.92 4.40 - 5.90 10*6/uL    Hemoglobin 10.2 (L) 13.0 - 18.0 g/dL    Hematocrit 32.8 (L) 40.0 - 52.0 %    MCV 66.7 (L) 80.0 - 98.0 fL    MCH 20.8 (L) 26.0 - 34.0 pg    MCHC 31.2 (L) 32.0 - 36.0 %    RDW 18.3 (H) 11.5 - 14.5 %    Platelets 302 140 - 440 10*3/uL    MPV 7.4 7.4 - 10.4 fL   Basic Metabolic Panel    Collection Time: 01/03/15  4:55 AM   Result Value Ref Range    Sodium 142 135 - 145 mmol/L    Potassium 4.9 3.5 - 5.1 mmol/L    Chloride 106 98 - 109 mmol/L    CO2 30 21 - 32 mmol/L    Anion Gap 6 NA    Glucose 109 (H) 70 - 100 mg/dL    BUN 21 7 - 25 mg/dL    CREATININE 1.09 0.55 - 1.40 mg/dL    eGFR African American >60.0 >60 mL/min    EGFR IF NonAfrican American >60.0 >60 mL/min    Calcium 8.0 (L) 8.2 - 10.1 mg/dL     Urine Culture:  No results found for this or any previous visit.    Physical Examination:  General appearance: in no apparent distress and non-toxic  HEENT: Head: Normocephalic, no lesions, without obvious abnormality.  Heart: regular rate and rhythm, S1, S2 normal, no murmur, click, rub or gallop  Lungs: clear to auscultation bilaterally  Abdomen: Abd soft, BS normoactive. Abd pain improved  Extremities: extremities normal, atraumatic, no cyanosis or edema  Musculoskeletal: No joint swelling, deformity, or tenderness.  Neurologic: no cranial nerve deficit, gait and coordination normal and speech normal  Psychiatric: appropriate, oriented to person, place and time/date  Skin: Skin color, texture,  turgor normal. No rashes or lesions.  ????  Assessment and Plan:  ????  (1) Acute Crohn's exacerbation: Discussed with GI, d/c Solu-Medrol and start on long Prednisone taper. Advance diet to full liquids. Patient has appointment with GI physician at the [facility] on Tuesday  (2) Ileus: Improving, Surgery following  (3) Hx MS  (4) Peripheral neuropathy: Lyrica    Melisssa Donner Shirlean Schlein PA-C

## 2015-01-03 NOTE — Progress Notes (Signed)
GENERAL SURGERY Progress Note    PATIENT NAME: Dennis Rivas     TODAY'S DATE: 01/03/2015    SUBJECTIVE:  Pain controlled Yes  Other Complaints No  Flatus/BM/or Ostomy function Yes, large BM this am    He had a good day yesterday, tolerated clears. And also tolerated fulls this am for breakfast. He had a BM, has less pain and overall is satisfied. Hb stable, no tachycardia. He is still on IV steroids.    CT from 01/01/15 reviewed.  GI and GS consult notes reviewed.    OBJECTIVE:  VITALS:    Visit Vitals   ??? BP (!) 148/95   ??? Pulse 64   ??? Temp 97.7 ??F (36.5 ??C)   ??? Resp 18   ??? Ht 6' (1.829 m)   ??? Wt 217 lb (98.4 kg)   ??? SpO2 94%   ??? BMI 29.43 kg/m2       INTAKE/OUTPUT:    I/O last 3 completed shifts:  In: 450 [P.O.:450]  Out: -   I/O this shift:  In: 250 [P.O.:240; I.V.:10]  Out: -     REVIEW OF SYSTEMS:  Pertinent positives and negatives as per interval history section    PHYSICAL EXAM:  CONSTITUTIONAL:  A&O X3.   LUNGS: Resp effort easy and unlabored  CV: Regular  ABDOMEN: soft, non-distended, non-tender, Peritoneal signs absent, numerous healed scars, no open wounds. Improved exam from 01/02/15 with less pain.    Data:  CBC:   Recent Labs      01/01/15   0436  01/02/15   0622  01/03/15   0455   WBC  13.0*  9.5  9.8   HGB  10.5*  10.4*  10.2*   HCT  34.0*  33.1*  32.8*   PLT  417  337  302     BMP:    Recent Labs      01/01/15   0436  01/02/15   0440  01/03/15   0455   NA  142  141  142   K  4.6  4.5  4.9   CL  107  107  106   CO2  BUN  CREATININE  0.99  1.03  1.09   GLUCOSE  96  100  109*     Hepatic: No results for input(s): AST, ALT, ALB, BILITOT, ALKPHOS in the last 72 hours.      ASSESSMENT AND PLAN:    Crohn's exacerbation -  Continue IV solumedrol. Plan to change to PO prednisone per GI.  Continue fulls and plan regular diet am 01/04/15  D/w Dr. Tasia Catchings.   If he tolerates diet today and a regular diet, ok to d/c per general surgery so he can keep his 8:30 am appt on 01/05/15 at [facility]  main campus for IBD management and f/u.    Patient counseled on risks, benefits, and alternatives of treatment plan today. Patient states an understanding and willingness to proceed with plan.    Electronically signed by Charlyn Minerva, MD on 01/03/2015 at 11:50 AM

## 2015-01-03 NOTE — Progress Notes (Signed)
Department of Gastroenterology      SUBJECTIVE:  Minimal abdominal pain and passing bowel movements. Started on full liquid diet    OBJECTIVE:      REVIEW OF SYSTEMS  unchanged    Medications    Current Facility-Administered Medications: predniSONE (DELTASONE) tablet 30 mg, 30 mg, Oral, Daily  HYDROmorphone (DILAUDID) injection 0.5 mg, 0.5 mg, Intravenous, Q4H PRN  hydrALAZINE (APRESOLINE) injection 10 mg, 10 mg, Intravenous, Q6H PRN  HYDROmorphone (DILAUDID) injection 1 mg, 1 mg, Intravenous, Q4H PRN  oxyCODONE-acetaminophen (PERCOCET) 5-325 MG per tablet 2 tablet, 2 tablet, Oral, Q4H PRN  sodium chloride flush 0.9 % injection 10 mL, 10 mL, Intravenous, 2 times per day  sodium chloride flush 0.9 % injection 10 mL, 10 mL, Intravenous, PRN  acetaminophen (TYLENOL) tablet 650 mg, 650 mg, Oral, Q4H PRN  magnesium hydroxide (MILK OF MAGNESIA) 400 MG/5ML suspension 30 mL, 30 mL, Oral, Daily PRN  bisacodyl (DULCOLAX) suppository 10 mg, 10 mg, Rectal, Daily PRN  enoxaparin (LOVENOX) injection 40 mg, 40 mg, Subcutaneous, Daily  ondansetron (ZOFRAN) injection 4 mg, 4 mg, Intravenous, Q6H PRN  pantoprazole (PROTONIX) injection 40 mg, 40 mg, Intravenous, Daily **AND** sodium chloride (PF) 0.9 % injection 10 mL, 10 mL, Intravenous, Daily  0.9 % sodium chloride infusion, , Intravenous, Continuous  pregabalin (LYRICA) capsule 150 mg, 150 mg, Oral, BID  therapeutic multivitamin-minerals 1 tablet, 1 tablet, Oral, Daily  melatonin tablet 3 mg, 3 mg, Oral, Nightly PRN  Physical    Gen:   Well nourished, no acute distress  HEENT: EOMI, no pallor, no scleral icteris  CVS:   Regular rate and rhythm, no murmurs, pulses 2+ b/l  Resp:   Normal respiratory effort, clear to ascultation b/l  Skin:   No rashes or lesions  Neuro:  Moving all extremities without difficulty, no gross defects  Lymph: No lymphadenopathy in axillary or inguinal areas  Psych:  Normal mood and affect  Musc:  Normal Gait, Full Range of Motion  Lymphatic: No palpable  lymphadenopathy in cervical or inguinal areas    Abdo:   +BS, soft, non tender, non distended, no rebound or guarding.    Data    CBC:   Lab Results   Component Value Date    WBC 9.8 01/03/2015    RBC 4.92 01/03/2015    HGB 10.2 01/03/2015    HCT 32.8 01/03/2015    MCV 66.7 01/03/2015    MCH 20.8 01/03/2015    MCHC 31.2 01/03/2015    RDW 18.3 01/03/2015    PLT 302 01/03/2015    MPV 7.4 01/03/2015       ASSESSMENT    1. crhon's disease  2. Bowel obstruction relieved    Patient Active Problem List  Principal Problem:    Crohn's colitis (HCC)  Active Problems:    Multiple sclerosis (HCC)        PLAN      1. Will change to po steroids      Consuella Lose, MD

## 2015-01-03 NOTE — Plan of Care (Signed)
Problem: Discharge Planning:  Goal: Participates in care planning  Participates in care planning   Outcome: Ongoing  Goal: Discharged to appropriate level of care  Discharged to appropriate level of care   Outcome: Ongoing    Problem: Bowel Function - Altered:  Goal: Bowel elimination is within specified parameters  Bowel elimination is within specified parameters   Outcome: Ongoing    Problem: Pain:  Goal: Pain level will decrease  Pain level will decrease   Outcome: Ongoing  Goal: Ability to notify healthcare provider of pain before it becomes unmanageable or unbearable will improve  Ability to notify healthcare provider of pain before it becomes unmanageable or unbearable will improve   Outcome: Ongoing  Goal: Control of acute pain  Control of acute pain   Outcome: Ongoing  Goal: Control of chronic pain  Control of chronic pain   Outcome: Ongoing

## 2015-01-04 MED ORDER — ACETAMINOPHEN 325 MG PO TABS
325 MG | ORAL_TABLET | ORAL | 3 refills | Status: AC | PRN
Start: 2015-01-04 — End: ?

## 2015-01-04 MED ORDER — PREDNISONE 10 MG PO TABS
10 MG | ORAL_TABLET | ORAL | 0 refills | Status: AC
Start: 2015-01-04 — End: ?

## 2015-01-04 MED ORDER — ONDANSETRON HCL 4 MG PO TABS
4 MG | ORAL_TABLET | Freq: Four times a day (QID) | ORAL | 0 refills | Status: AC | PRN
Start: 2015-01-04 — End: ?

## 2015-01-04 MED FILL — OXYCODONE-ACETAMINOPHEN 5-325 MG PO TABS: 5-325 MG | ORAL | Qty: 2

## 2015-01-04 MED FILL — HYDROMORPHONE HCL 1 MG/ML IJ SOLN: 1 MG/ML | INTRAMUSCULAR | Qty: 1

## 2015-01-04 MED FILL — LYRICA 75 MG PO CAPS: 75 mg | ORAL | Qty: 2

## 2015-01-04 MED FILL — LYRICA 75 MG PO CAPS: 75 MG | ORAL | Qty: 2

## 2015-01-04 NOTE — Other (Unsigned)
Patient Acct Nbr: [id number]   Primary AUTH/CERT:   Primary Insurance Company Name: [employer]  Primary Insurance Plan name: Medicare A  Primary Insurance Group Number:   Primary Insurance Plan Type: [employer] A  Primary Insurance Policy Number: [id number]    Secondary AUTH/CERT:   Secondary Insurance Company Name: [employer]  Secondary Insurance Plan name: Medicare B  Secondary Insurance Group Number:   Secondary Insurance Plan Type: [employer] B  Secondary Insurance Policy Number: [id number] A

## 2015-01-04 NOTE — Progress Notes (Signed)
Subjective: Abdominal pain improved. +Bright red blood per rectum since yesterday. +Multiple bowel movements. Tolerated full liquids.     Objective:     VS:   Visit Vitals   ??? BP (!) 166/96   ??? Pulse 66   ??? Temp 98.6 ??F (37 ??C)   ??? Resp 18   ??? Ht 6' (1.829 m)   ??? Wt 217 lb (98.4 kg)   ??? SpO2 97%   ??? BMI 29.43 kg/m2     Labs:    Recent Results (from the past 24 hour(s))   Hemoglobin and Hematocrit, Blood    Collection Time: 01/03/15  4:09 PM   Result Value Ref Range    Hemoglobin 11.1 (L) 13.0 - 18.0 g/dL    Hematocrit 16.1 (L) 40.0 - 52.0 %     Urine Culture:  No results found for this or any previous visit.    Physical Examination:  General appearance: in no apparent distress and non-toxic  HEENT: Head: Normocephalic, no lesions, without obvious abnormality.  Heart: regular rate and rhythm, S1, S2 normal, no murmur, click, rub or gallop  Lungs: clear to auscultation bilaterally  Abdomen: Abd soft, BS normoactive. Abd pain improved  Extremities: extremities normal, atraumatic, no cyanosis or edema  Musculoskeletal: No joint swelling, deformity, or tenderness.  Neurologic: no cranial nerve deficit, gait and coordination normal and speech normal  Psychiatric: appropriate, oriented to person, place and time/date  Skin: Skin color, texture, turgor normal. No rashes or lesions.  ????  Assessment and Plan:  ????  (1) Acute Crohn's exacerbation: Discussed with GI, continue Prednisone taper. Advance diet to regular. Patient has appointment with GI physician at the [facility] on Tuesday. Cleared for discharge per Surgery  (2) Hx MS  (3) Peripheral neuropathy: Lyrica    Nyelah Emmerich Arvella Merles PA-C

## 2015-01-04 NOTE — Progress Notes (Signed)
HPI   Dennis Rivas is a 49 y.o. male who we are following on the inpatient service for crohns disease. He shared that he has had bright red rectal bleeding the past 48 hours. Painless rectal bleeding. He denies any Lovenox since admission. His pain is in the right upper and lower quadrant pain. No epigastric pain, nausea vomiting. He has appetite. Spoke with IMS and Dr Tasia Catchings, they both agree that this is typical disease course and with stable hemoglobin being stable yesterday, he is able to eat. He has appointment with specialist on Tuesday and can be discharged to make this appointment. Nursing advised ok to eat. NO Lovenox. Monitor bowels per patient small amount this am. Ok for discharge if tolerates diet.         Diagnosis Date   ??? Anemia    ??? Crohn's disease (HCC)    ??? Multiple sclerosis (HCC)          Procedure Laterality Date   ??? Abdomen surgery     ??? Appendectomy     ??? Small intestine surgery     ??? Hernia repair     ??? Knee surgery       Current Facility-Administered Medications   Medication Dose Route Frequency Provider Last Rate Last Dose   ??? predniSONE (DELTASONE) tablet 30 mg  30 mg Oral Daily Otilio Saber, PA   30 mg at 01/03/15 1219   ??? HYDROmorphone (DILAUDID) injection 0.5 mg  0.5 mg Intravenous Q4H PRN Otilio Saber, PA   0.5 mg at 01/01/15 1801   ??? hydrALAZINE (APRESOLINE) injection 10 mg  10 mg Intravenous Q6H PRN Otilio Saber, PA       ??? HYDROmorphone (DILAUDID) injection 1 mg  1 mg Intravenous Q4H PRN Otilio Saber, PA   1 mg at 01/04/15 0426   ??? oxyCODONE-acetaminophen (PERCOCET) 5-325 MG per tablet 2 tablet  2 tablet Oral Q4H PRN Virgel Manifold, DO   2 tablet at 01/04/15 1610   ??? sodium chloride flush 0.9 % injection 10 mL  10 mL Intravenous 2 times per day Dorcas Mcmurray, MD   10 mL at 01/03/15 2144   ??? sodium chloride flush 0.9 % injection 10 mL  10 mL Intravenous PRN Shreebatsa Dhital, MD       ??? acetaminophen (TYLENOL) tablet  650 mg  650 mg Oral Q4H PRN Shreebatsa Dhital, MD       ??? magnesium hydroxide (MILK OF MAGNESIA) 400 MG/5ML suspension 30 mL  30 mL Oral Daily PRN Shreebatsa Dhital, MD       ??? bisacodyl (DULCOLAX) suppository 10 mg  10 mg Rectal Daily PRN Dorcas Mcmurray, MD       ??? enoxaparin (LOVENOX) injection 40 mg  40 mg Subcutaneous Daily Shreebatsa Dhital, MD   40 mg at 01/02/15 0835   ??? ondansetron (ZOFRAN) injection 4 mg  4 mg Intravenous Q6H PRN Shreebatsa Dhital, MD   4 mg at 01/03/15 1222   ??? pantoprazole (PROTONIX) injection 40 mg  40 mg Intravenous Daily Shreebatsa Dhital, MD   40 mg at 01/03/15 0805    And   ??? sodium chloride (PF) 0.9 % injection 10 mL  10 mL Intravenous Daily Shreebatsa Dhital, MD   10 mL at 01/03/15 0805   ??? 0.9 % sodium chloride infusion   Intravenous Continuous Otilio Saber, PA 100 mL/hr at 01/03/15 2118     ??? pregabalin (LYRICA) capsule 150 mg  150 mg Oral  BID Dorcas Mcmurray, MD   150 mg at 01/03/15 2118   ??? therapeutic multivitamin-minerals 1 tablet  1 tablet Oral Daily Dorcas Mcmurray, MD   1 tablet at 01/03/15 0805   ??? melatonin tablet 3 mg  3 mg Oral Nightly PRN Dorcas Mcmurray, MD         Allergies   Allergen Reactions   ??? Morphine         Visit Vitals   ??? BP (!) 166/96   ??? Pulse 66   ??? Temp 98.6 ??F (37 ??C)   ??? Resp 18   ??? Ht 6' (1.829 m)   ??? Wt 217 lb (98.4 kg)   ??? SpO2 97%   ??? BMI 29.43 kg/m2     Gen:   Well nourished, no acute distress  HEENT: EOMI, no pallor, no scleral icteris  CVS:   Regular rate and rhythm, no murmurs, pulses 2+ b/l  Resp:   Normal respiratory effort, clear to ascultation b/l  Skin:   No rashes or lesions  Neuro: Moving all extremities without difficulty, no gross defects  Lymph: No lymphadenopathy in axillary or inguinal areas  Psych:  Normal mood and affect  Abdo:   +BS, soft, RUQ and RLQ tender, non distended, no rebound or guarding,     Laboratory  Recent Labs      01/02/15   0440  01/03/15   0455   NA  141  142   K  4.5  4.9   CL  107  106    CO2  24  30   BUN  17  21   CREATININE  1.03  1.09   GLUCOSE  100  109*   CALCIUM  8.0*  8.0*     Recent Labs      01/03/15   1609  01/03/15   0455  01/02/15   0622  01/01/15   0436   WBC   --   9.8  9.5  13.0*   HGB  11.1*  10.2*  10.4*  10.5*   HCT  35.8*  32.8*  33.1*  34.0*   MCV   --   66.7*  67.5*  67.5*   PLT   --   302  337  417     This is a 49 y.o. male consulted for     Assessment:  1 Crohns  2 PSBO vs ileus improved  3 MS    Plan:  1 PO steriods  2 full liquid diet    Electronically signed by Domingo Sep, CNP on 01/04/2015 at 7:57 AM

## 2015-01-04 NOTE — Discharge Summary (Signed)
Hospitalist Discharge Summary    Dennis Rivas  DOB:  Feb 14, 1966  MRN:  [id number]    Admit date:  12/29/2014  Discharge date:  01/04/2015    Admitting Physician:  Virgel Manifold, DO  Primary Care Physician:  Elvera Lennox, DO    Discharge Diagnoses:  Acute Crohn's exacerbation  Hx MS  Peripheral neuropathy    Consults:  GI, Surgery    Hospital Course:   This is a 49 YO male who presented to the ER with CC of abdominal pain, nausea and vomiting.  He has a hx Crohn's disease and previously had been receiving Entyvio infusions but had not had one in awhile. Workup in ER included CT A/P, showing acute Crohn's flare involving the distal small bowel leading up to the ileocolonic anastomosis; negative for abscess.  Patient was admitted to the general medical floor for Crohn's exacerbation and started on IV Solu-Medrol and Cipro/Flagyl.  GI was consulted.  Patient was initially started on a clear liquid diet and advanced as tolerated.  His abdominal pain, nausea, and vomiting have significantly improved.  He tolerated a regular diet today without difficulty.  General Surgery was consulted during admission for concerns of questionable PSBO on abdominal xrays.  No surgical issues/intervention felt necessary.  Patient has been transitioned from IV Solu-Medrol to oral Prednisone and will be sent home on a long taper per GI recommendations.  He has a follow-up appointment with a GI physician at [facility] tomorrow.     Vitals On Discharge:   Vitals:    01/04/15 0747   BP: (!) 166/96   Pulse: 66   Resp: 18   Temp: 98.6 ??F (37 ??C)   SpO2: 97%       Discharge Exam:   General appearance: in no apparent distress and non-toxic  HEENT: Head: Normocephalic, no lesions, without obvious abnormality.  Heart: regular rate and rhythm, S1, S2 normal, no murmur, click, rub or gallop  Lungs: clear to auscultation bilaterally  Abdomen: Abd soft, BS normoactive. Abd pain improved  Extremities: extremities normal, atraumatic, no cyanosis or  edema  Musculoskeletal: No joint swelling, deformity, or tenderness.  Neurologic: no cranial nerve deficit, gait and coordination normal and speech normal  Psychiatric: appropriate, oriented to person, place and time/date  Skin: Skin color, texture, turgor normal. No rashes or lesions.    Discharge Medications:       Dennis, Rivas   Home Medication Instructions JYN:[id number]    Printed on:01/04/15 1238   Medication Information                      acetaminophen (TYLENOL) 325 MG tablet  Take 2 tablets by mouth every 4 hours as needed for Pain             amphetamine-dextroamphetamine (ADDERALL, 10MG ,) 10 MG tablet  Take 10 mg by mouth 2 times daily             Multiple Vitamins-Minerals (THERAPEUTIC MULTIVITAMIN-MINERALS) tablet  Take 1 tablet by mouth daily             ondansetron (ZOFRAN) 4 MG tablet  Take 1 tablet by mouth every 6 hours as needed for Nausea or Vomiting             predniSONE (DELTASONE) 10 MG tablet  Prednisone taper: 30mg  X 1week, 25mg  X1 week, 20mg  X1 week, 15mg  X1week, 10mg X1 week, 5mg  Myrtis Ser  Vedolizumab (ENTYVIO IV)  Infuse intravenously Indications: every other month. last infusioon 09/23/14                 Discharge Instructions:   Diet: Regular, healthy diet   Activity: As tolerated   Recommended Follow-up: Elvera Lennox, DO in one week, GI physician as [facility] at scheduled appointment tomorrow     Disposition:   Discharged home in stable condition    Attending Physician for IMS: Dr. Deboraha Sprang    Total time spent on discharge was greater than 30 minutes.     Signed:  [name]  01/04/2015, 12:15 PM

## 2015-01-19 ENCOUNTER — Encounter: Attending: Nurse Practitioner | Primary: Family Medicine

## 2015-11-21 ENCOUNTER — Inpatient Hospital Stay
Admit: 2015-11-21 | Discharge: 2015-11-21 | Disposition: A | Discharge status: Home / Self Care | Attending: Emergency Medicine

## 2015-11-21 DIAGNOSIS — R109 Unspecified abdominal pain: Secondary | ICD-10-CM

## 2015-11-21 LAB — COMPREHENSIVE METABOLIC PANEL
ALT: 24 U/L (ref 12–78)
AST: 18 U/L (ref 15–37)
Albumin,Serum: 3.5 g/dL (ref 3.4–5.0)
Alkaline Phosphatase: 49 U/L (ref 45–117)
Anion Gap: 7 NA
BUN: 24 mg/dL (ref 7–25)
CO2: 25 mmol/L (ref 21–32)
Calcium: 8.8 mg/dL (ref 8.2–10.1)
Chloride: 106 mmol/L (ref 98–109)
Creatinine: 1.51 mg/dL — ABNORMAL HIGH (ref 0.55–1.40)
EGFR IF NonAfrican American: 49.2 mL/min (ref >60)
Glucose: 89 mg/dL (ref 70–100)
Potassium: 4.1 mmol/L (ref 3.5–5.1)
Sodium: 138 mmol/L (ref 135–145)
Total Bilirubin: 0.3 mg/dL (ref 0.2–1.0)
Total Protein: 6.7 g/dL (ref 6.4–8.2)
eGFR African American: 59.6 mL/min (ref >60)

## 2015-11-21 LAB — URINALYSIS
Bilirubin, Urine: NEGATIVE NA
Glucose, Ur: NEGATIVE mg/dL
Ketones, Urine: NEGATIVE mg/dL
LEUKOCYTES, UA: NEGATIVE NA
Nitrite, Urine: NEGATIVE NA
Occult Blood,Urine: NEGATIVE {RBC}/uL
Specific Gravity, Urine: 1.004 NA — ABNORMAL LOW (ref 1.005–1.030)
Total Protein, Urine: NEGATIVE mg/dL
Urobilinogen, Urine: 0.2 mg/dL (ref 0–1)
pH, Urine: 6.5 NA (ref 5.0–8.0)

## 2015-11-21 LAB — CBC WITH AUTO DIFFERENTIAL
Absolute Baso #: 0.1 10*3/uL (ref 0.0–0.2)
Absolute Eos #: 0.2 10*3/uL (ref 0.0–0.5)
Absolute Lymph #: 1.4 10*3/uL (ref 1.0–4.3)
Absolute Mono #: 0.8 10*3/uL (ref 0.0–0.8)
Absolute Neut #: 4.3 10*3/uL (ref 1.8–7.0)
Basophils: 1.2 %
Eosinophils: 2.5 %
Granulocytes %: 63.8 %
Hematocrit: 36.5 % — ABNORMAL LOW (ref 40.0–52.0)
Hemoglobin: 12.2 g/dL — ABNORMAL LOW (ref 13.0–18.0)
Lymphocyte %: 20.9 %
MCH: 25.1 pg — ABNORMAL LOW (ref 26.0–34.0)
MCHC: 33.4 % (ref 32.0–36.0)
MCV: 75.2 fL — ABNORMAL LOW (ref 80.0–98.0)
MPV: 7.2 fL — ABNORMAL LOW (ref 7.4–10.4)
Monocytes: 11.6 %
Platelets: 237 10*3/uL (ref 140–440)
RBC: 4.85 10*6/uL (ref 4.40–5.90)
RDW: 18.2 % — ABNORMAL HIGH (ref 11.5–14.5)
WBC: 6.7 10*3/uL (ref 3.6–10.7)

## 2015-11-21 LAB — LIPASE: Lipase: 131 IU/L (ref 73–393)

## 2015-11-21 MED ORDER — OXYCODONE-ACETAMINOPHEN 10-325 MG PO TABS
10-325 MG | Freq: Once | ORAL | Status: AC
Start: 2015-11-21 — End: 2015-11-21
  Administered 2015-11-21: 05:00:00 1 via ORAL

## 2015-11-21 MED ORDER — SODIUM CHLORIDE 0.9 % IV BOLUS
0.9 % | Freq: Once | INTRAVENOUS | Status: AC
Start: 2015-11-21 — End: 2015-11-21
  Administered 2015-11-21: 05:00:00 1000 mL via INTRAVENOUS

## 2015-11-21 MED ORDER — ONDANSETRON HCL 4 MG/2ML IJ SOLN
4 MG/2ML | INTRAMUSCULAR | Status: DC | PRN
Start: 2015-11-21 — End: 2015-11-21
  Administered 2015-11-21: 05:00:00 4 mg via INTRAVENOUS

## 2015-11-21 MED ORDER — IOPAMIDOL 76 % IV SOLN
76 % | Freq: Once | INTRAVENOUS | Status: AC | PRN
Start: 2015-11-21 — End: 2015-11-21
  Administered 2015-11-21: 06:00:00 75 mL via INTRAVENOUS

## 2015-11-21 MED FILL — OXYCODONE-ACETAMINOPHEN 10-325 MG PO TABS: 10-325 MG | ORAL | Qty: 1

## 2015-11-21 MED FILL — ONDANSETRON HCL 4 MG/2ML IJ SOLN: 4 MG/2ML | INTRAMUSCULAR | Qty: 2

## 2015-11-21 NOTE — ED Triage Notes (Signed)
Pt presents at this time brought in from [facility] by squad.  Pt has been there for one week for IV therapy.  Pt c/o abdominal pain near ostomy site that has been worsening over the past 20 hours.  Pt has a history of crohns dx.  Pt is scheduled for reversal of ostomy by Dr. Johnnette Litter ([place] [facility]) 8/14.  Pt states slight nausea w/o vomiting.

## 2015-11-21 NOTE — Other (Unsigned)
Patient Acct Nbr: [id number]   Primary AUTH/CERT:   Primary Insurance Company Name: [employer]  Primary Insurance Plan name: Medicare A  Primary Insurance Group Number:   Primary Insurance Plan Type: [place] A  Primary Insurance Policy Number: [id number]    Secondary AUTH/CERT:   Secondary Insurance Company Name: [employer]  Secondary Insurance Plan name: Medicare B  Secondary Insurance Group Number:   Secondary Insurance Plan Type: Vickii [employer] Insurance Policy Number: [id number] A

## 2015-11-21 NOTE — ED Notes (Signed)
[facility] called for ambulette [facility]  ETA of 10 minutes given.  They are unable to provide a wheelchair ambulette so they will be providing an ambulance with a cot while charging the patient the rate for an ambulette.     Liam Rogers, RN  11/21/15 [phone]

## 2015-11-21 NOTE — ED Provider Notes (Signed)
Triage Chief Complaint:   Abdominal Pain    HOPI:  Dennis Rivas is a 50 y.o. male that presents with pain and swelling near his ileostomy site. Patient reports that he noticed some increased pain earlier this morning, round 3 AM. He reports then the pain has gradually gotten worse and then when he was changing the packing 4 and abdominal abscess that he is treating him noticed significant pain and swelling near his ileostomy site. Patient is concerned that he is developing another abscess. He is been having nausea but no vomiting. Patient has a significant history of Crohn's disease, MS, hypertension is had multiple abdominal and bowel surgeries in the past. Patient reports that he scheduled have his ileostomy reversed in August. Rates his pain as 9/10 described as a sharp and stabbing pain that is constant. Nothing seems to make it better or worse. Has not taken any over-the-counter prescription medications for his symptoms seeming. Patient reports that he is currently at [facility] for TPN and has a PICC line. Denies fever, chills, cough, shortness breath, chest pain, vomiting, urinary symptoms, joint pain, numbness, tingling, focal weakness or other associated signs or symptoms.    ROS:  At least 10 systems reviewed and otherwise negative except as in the [place].    PAST MEDICAL HISTORY/SURGICAL HISTORY    Past Medical History:   Diagnosis Date   ??? Anemia    ??? Crohn's disease ([place])    ??? Hypertension    ??? Multiple sclerosis ([place])      Past Surgical History:   Procedure Laterality Date   ??? ABDOMEN SURGERY     ??? APPENDECTOMY     ??? HERNIA REPAIR     ??? KNEE SURGERY     ??? SMALL INTESTINE SURGERY         CURRENT MEDICATIONS    Current Outpatient Rx   Medication Sig Dispense Refill   ??? chlorthalidone (HYGROTON) 25 MG tablet Take 12.5 mg by mouth daily     ??? codeine 30 MG tablet Take 30 mg by mouth every 6 hours as needed for Pain .     ??? ergocalciferol (ERGOCALCIFEROL) 50000 units capsule Take 50,000 Units by  mouth once a week     ??? ferrous sulfate 325 (65 Fe) MG tablet Take 325 mg by mouth daily (with breakfast)     ??? psyllium (KONSYL) 28.3 % PACK Take 1 packet by mouth daily     ??? diphenoxylate-atropine (LOMOTIL) 2.5-0.025 MG per tablet Take 1 tablet by mouth 4 times daily as needed for Diarrhea     ??? loperamide (IMODIUM) 2 MG capsule Take 2 mg by mouth 4 times daily as needed for Diarrhea     ??? pregabalin (LYRICA) 150 MG capsule Take 150 mg by mouth 2 times daily     ??? omeprazole (PRILOSEC) 20 MG delayed release capsule Take 20 mg by mouth daily     ??? OPIUM TINCTURE, PAREGORIC, PO Take by mouth     ??? oxyCODONE 5 MG capsule Take 5 mg by mouth every 4 hours as needed for Pain .     ??? promethazine (PHENERGAN) 25 MG tablet Take 25 mg by mouth every 6 hours as needed for Nausea     ??? predniSONE (DELTASONE) 10 MG tablet Prednisone taper: 73m X 1week, 280mX1 week, 2059m1 week, 15m83mweek, 10mg72meek, 5mg X23mek 75 tablet 0   ??? acetaminophen (TYLENOL) 325 MG tablet Take 2 tablets by mouth every 4 hours  as needed for Pain 120 tablet 3   ??? ondansetron (ZOFRAN) 4 MG tablet Take 1 tablet by mouth every 6 hours as needed for Nausea or Vomiting 20 tablet 0   ??? amphetamine-dextroamphetamine (ADDERALL, 10MG,) 10 MG tablet Take 10 mg by mouth 2 times daily     ??? Multiple Vitamins-Minerals (THERAPEUTIC MULTIVITAMIN-MINERALS) tablet Take 1 tablet by mouth daily     ??? Vedolizumab (ENTYVIO IV) Infuse intravenously Indications: every other month. last infusioon 09/23/14         ALLERGIES    Allergies   Allergen Reactions   ??? Fish-Derived Products      Other reaction(s): Unknown   ??? Morphine        FAMILY HISTORY/SOCIAL HISTORY    History reviewed. No pertinent family history.    Social History     Social History   ??? Marital status: Single     Spouse name: N/A   ??? Number of children: N/A   ??? Years of education: N/A     Social History Main Topics   ??? Smoking status: Current Every Day Smoker     Types: Cigars   ??? Smokeless tobacco: None    ??? Alcohol use No   ??? Drug use: Yes     Special: Marijuana      Comment: 3 times monthly   ??? Sexual activity: Not Asked     Other Topics Concern   ??? None     Social History Narrative       Nursing Notes Reviewed    Physical Exam:  ED Triage Vitals   Enc Vitals Group      BP 11/21/15 0040 131/2      Pulse 11/21/15 0040 88      Resp 11/21/15 0040 18      Temp 11/21/15 0040 98.2 ??F (36.8 ??C)      Temp Source 11/21/15 0040 Oral      SpO2 11/21/15 0040 100 %      Weight --       Height --       Head Cir --       Peak Flow --       Pain Score --       Pain Loc --       Pain Edu? --       Excl. in [place]? --      GENERAL APPEARANCE: Awake and alert. Cooperative. No acute distress.  HEAD: Normocephalic. Atraumatic.  EYES: EOM's grossly intact. Sclera anicteric. PERRLA  ENT: Mucous membranes are moist. Tolerates saliva. No trismus.  Normal nose, patent airway, nonerythematous TMs and EACs  NECK: Supple. No meningismus. Trachea midline.  HEART: RRR. Radial pulses 2+.  LUNGS: Respirations unlabored. CTAB  ABDOMEN: Soft. Ileostomy in the right mid abdomen, and there is notable fluctuant swelling to the medial aspect of the ileostomy. No overlying erythema. Very tender to palpation. There is also mild LLQ tenderness. No guarding or rebound. NBS  GU: No CVA tenderness  EXTREMITIES: No acute deformities.  Full range of motion of upper and lower extremities  SKIN: Warm and dry.  NEUROLOGICAL: No gross facial drooping. Moves all 4 extremities spontaneously.    I have reviewed and interpreted all of the currently available lab results from this visit (if applicable):  Results for orders placed or performed during the hospital encounter of 11/21/15   CBC Auto Differential   Result Value Ref Range    WBC 6.7 3.6 - 10.7 10*3/uL  RBC 4.85 4.40 - 5.90 10*6/uL    Hemoglobin 12.2 (L) 13.0 - 18.0 g/dL    Hematocrit 36.5 (L) 40.0 - 52.0 %    MCV 75.2 (L) 80.0 - 98.0 fL    MCH 25.1 (L) 26.0 - 34.0 pg    MCHC 33.4 32.0 - 36.0 %    RDW 18.2 (H)  11.5 - 14.5 %    Platelets 237 140 - 440 10*3/uL    MPV 7.2 (L) 7.4 - 10.4 fL    Granulocytes % 63.8 %    Lymphocyte % 20.9 %    Monocytes 11.6 %    Eosinophils 2.5 %    Basophils 1.2 %    Absolute Neut # 4.3 1.8 - 7.0 10*3/uL    Absolute Lymph # 1.4 1.0 - 4.3 10*3/uL    Absolute Mono # 0.8 0.0 - 0.8 10*3/uL    Absolute Eos # 0.2 0.0 - 0.5 10*3/uL    Absolute Baso # 0.1 0.0 - 0.2 10*3/uL   Comprehensive Metabolic Panel   Result Value Ref Range    Sodium 138 135 - 145 mmol/L    Potassium 4.1 3.5 - 5.1 mmol/L    Chloride 106 98 - 109 mmol/L    CO2 25 21 - 32 mmol/L    Anion Gap 7 NA    Glucose 89 70 - 100 mg/dL    BUN 24 7 - 25 mg/dL    CREATININE 1.51 (H) 0.55 - 1.40 mg/dL    eGFR African American 59.6 >60 mL/min    EGFR IF NonAfrican American 49.2 >60 mL/min    Calcium 8.8 8.2 - 10.1 mg/dL    Albumin,Serum 3.5 3.4 - 5.0 g/dL    Total Protein 6.7 6.4 - 8.2 g/dL    Total Bilirubin 0.3 0.2 - 1.0 mg/dL    Alkaline Phosphatase 49 45 - 117 U/L    ALT 24 12 - 78 U/L    AST 18 15 - 37 U/L   Lipase   Result Value Ref Range    Lipase 131 73 - 393 [IU]/L   Urinalysis   Result Value Ref Range    Appearance CLEAR Clear NA    Color, UA YELLOW Lt. Yellow NA    Specific Gravity, Urine 1.004 (L) 1.005 - 1.030 NA    pH, Urine 6.5 5.0 - 8.0 NA    LEUKOCYTES, UA NEG Negative NA    Nitrite, Urine NEG Negative NA    Total Protein, Urine NEG Negative mg/dL    Glucose, Ur NEG Negative mg/dL    Ketones, Urine NEG Negative mg/dL    Urobilinogen, Urine 0.2 0 - 1 mg/dL    Bilirubin, Urine NEG Negative NA    Occult Blood,Urine NEG Negative [RBC]/uL        Radiographs (if obtained):  CT Abdomen Pelvis W Contrast    (Results Pending)       Chart review shows recent radiographs:  No results found.        MDM/ED COURSE:    Supervising physician: Dr. Sandria Bales    CBC is unremarkable. CMP shows an elevated creatinine at 1.51. Lipase is 131. UA shows no signs of infection or renal calculi. Patient was given IV fluids, Zofran and Percocet here in the ED.  CT of the abdomen and pelvis is pending.    2:04 AM I have signed out [facility] Emergency Department care to Dr. Sandria Bales. We discussed the history, physical exam, completed/pending test results (if obtained) and current  treatment plan. Please refer to his/her chart for the patients further details, remaining Emergency Department course, final disposition and diagnosis.        Clinical Impression:  No diagnosis found.    DISCHARGE MEDICATIONS:  New Prescriptions    No medications on file       (Please note that portions of this note may have been completed with a voice recognition program. Efforts were made to edit the dictations but occasionally words are mis-transcribed.)         Lajoyce Lauber, PA-C  11/21/15 0204

## 2015-11-21 NOTE — ED Provider Notes (Signed)
Attending Supervisory Note/Shared Visit   50 year old male with a history of Crohn's colitis, status post colostomy with scheduled colostomy reversal in the near future, follows up with a surgeon including clinic, with a history abdominal wall abscesses, who presents today complaining of some peristomal pain.    Although the initial history and physical examination information was obtained by the advanced practice provider who also documents a record of this visit, I independently examined the patient and made all diagnostic, treatment and disposition decisions.  He takes care of his abdominal wound sites by himself, he is very in tune with his daily regimen, he is on PICC line for TPN therapy, states that he started noticing around 3 o'clock yesterday some swelling around the stomal site, was concerned could be another abscess.  Feels fluctuant to him, he said increasing pain.  Otherwise denies any other purulence or other drainage this is not unusual for him.  Denies fevers or chills or vomiting.  Denies trauma.  It is peristomal in nature, otherwise his stoma is functioning appropriately.  On exam vital signs reviewed.  General this is a Caucasian male sitting in the hospital bed.  His no distress pleasant and interactive.  Focused abdominal examination does show stoma to the right lower quadrant intact, brown stool noted in the back.  There is a little bit of swelling around the stomal area towards the median.  There is a small healing abdominal incision wound, without any purulence or drainage.  He has no other abdominal pain noted, he does have multiple surgical scars which are appropriate for history.  He is just locally tender to the one spot and one side of the peristomal area.  No other guarding no rebound, again there is no other fluctuance, warmth or pathological swelling, no other rash or lesion noted.  Differential would include abscess, cellulose, peristomal hernia, incarceration, bowel  obstruction, fistulization or other acute processes.  IV established, given a medicine for pain.  Basic labs are performed, urine, electrolytes unremarkable except for a creatinine of 1.51, complete blood count unremarkable, CT scanning reveals no acute process.  Does have a lot of postoperative changes.  There is no abnormal fluid collection noted.  This point time I do not see any drainable abscess or fluid collection, etiology of this area of swelling is unclear, otherwise he can be discharged home with follow-up with the surgeon as needed.  Otherwise return immediately with other concerns he may have.  He understands and agrees, otherwise is discharged home stable and improved condition    For further details of my evaluation of this patient, please see the advance practice provider's documentation.      Lelon Mast, DO  Attending Emergency Physician         Dow Adolph, DO  11/21/15 2542

## 2015-11-28 DIAGNOSIS — R1030 Lower abdominal pain, unspecified: Secondary | ICD-10-CM

## 2015-11-28 NOTE — ED Notes (Signed)
Patient to CT     Ceasar Mons, RN  11/28/15 2156

## 2015-11-28 NOTE — ED Provider Notes (Signed)
[facility] ED  eMERGENCY dEPARTMENT eNCOUnter      Pt Name: Dennis Rivas  MRN: [id number]  Birthdate Feb 02, 1966  Date of evaluation: 11/28/2015  Provider: Laverle Hobby, MD    CHIEF COMPLAINT       Chief Complaint   Patient presents with   ??? Groin Pain     Sudden 9/10 sharp pain to scrotum starting 15 minutes PTA while pt was walking outside. Pt denies ever having pain like this before. Presently staying at [facility] following ostomy placement at [facility]. Scheduled for reversal on Thurs.         HISTORY OF PRESENT ILLNESS   (Location/Symptom, Timing/Onset, Context/Setting, Quality, Duration, Modifying Factors, Severity)  Note limiting factors.   Dennis Rivas is a 50 y.o. male who presents to the emergency department with groin pain.  Left greater right.  730 onset sudden.  He is at [facility] care for ostomy care.  He denies chest pain, shortness breath, fever, chills, lightheadedness, dizziness.  No trauma.  No discomfort with urination.  No other complaints.  Felt fine prior.  He was going outside to smoke a cigarette.  Comes in to be seen.     HPI    Nurse's notes for past medical history, surgical history, social history were reviewed.  Medications and allergies reviewed.    REVIEW OF SYSTEMS    (2-9 systems for level 4, 10 or more for level 5)     Review of Systems    Total of 10 systems reviewed, please see pertinent positives, pertinent negatives above in HPI.      PAST MEDICAL HISTORY     Past Medical History:   Diagnosis Date   ??? Anemia    ??? Arthritis    ??? Crohn's disease (HCC)    ??? Hypertension    ??? Multiple sclerosis (HCC)          SURGICAL HISTORY       Past Surgical History:   Procedure Laterality Date   ??? ABDOMEN SURGERY     ??? APPENDECTOMY     ??? HERNIA REPAIR     ??? KNEE SURGERY     ??? SMALL INTESTINE SURGERY           CURRENT MEDICATIONS       Previous Medications    ACETAMINOPHEN (TYLENOL) 325 MG TABLET    Take 2 tablets by mouth every 4 hours as needed for Pain     AMPHETAMINE-DEXTROAMPHETAMINE (ADDERALL, ,) 10 MG TABLET    Take 10 mg by mouth 2 times daily    CHLORTHALIDONE (HYGROTON) 25 MG TABLET    Take 12.5 mg by mouth daily    CODEINE 30 MG TABLET    Take 30 mg by mouth every 6 hours as needed for Pain .    DIPHENOXYLATE-ATROPINE (LOMOTIL) 2.5-0.025 MG PER TABLET    Take 1 tablet by mouth 4 times daily as needed for Diarrhea    ERGOCALCIFEROL (ERGOCALCIFEROL) 50000 UNITS CAPSULE    Take 50,000 Units by mouth once a week    FERROUS SULFATE 325 (65 FE) MG TABLET    Take 325 mg by mouth daily (with breakfast)    LOPERAMIDE (IMODIUM) 2 MG CAPSULE    Take 2 mg by mouth 4 times daily as needed for Diarrhea    MULTIPLE VITAMINS-MINERALS (THERAPEUTIC MULTIVITAMIN-MINERALS) TABLET    Take 1 tablet by mouth daily    OMEPRAZOLE (PRILOSEC) 20 MG DELAYED RELEASE CAPSULE    Take 20  mg by mouth daily    ONDANSETRON (ZOFRAN) 4 MG TABLET    Take 1 tablet by mouth every 6 hours as needed for Nausea or Vomiting    OPIUM TINCTURE, PAREGORIC, PO    Take by mouth    OXYCODONE 5 MG CAPSULE    Take 5 mg by mouth every 4 hours as needed for Pain .    PREDNISONE (DELTASONE) 10 MG TABLET    Prednisone taper: 30mg  X 1week, 25mg  X1 week, 20mg  X1 week, 15mg  X1week, 10mg X1 week, 5mg  X1week    PREGABALIN (LYRICA) 150 MG CAPSULE    Take 150 mg by mouth 2 times daily    PROMETHAZINE (PHENERGAN) 25 MG TABLET    Take 25 mg by mouth every 6 hours as needed for Nausea    PSYLLIUM (KONSYL) 28.3 % PACK    Take 1 packet by mouth daily    VEDOLIZUMAB (ENTYVIO IV)    Infuse intravenously Indications: every other month. last infusioon 09/23/14       ALLERGIES     Fish-derived products and Morphine    FAMILY HISTORY     History reviewed. No pertinent family history.       SOCIAL HISTORY       Social History     Social History   ??? Marital status: Single     Spouse name: N/A   ??? Number of children: N/A   ??? Years of education: N/A     Social History Main Topics   ??? Smoking status: Current Every Day Smoker     Types:  Cigars   ??? Smokeless tobacco: None   ??? Alcohol use No   ??? Drug use: Yes     Special: Marijuana      Comment: 3 times monthly   ??? Sexual activity: Not Asked     Other Topics Concern   ??? None     Social History Narrative       SCREENINGS             PHYSICAL EXAM    (up to 7 for level 4, 8 or more for level 5)   ED Triage Vitals   BP Temp Temp Source Pulse Resp SpO2 Height Weight   11/28/15 2015 11/28/15 2024 11/28/15 2024 11/28/15 2015 11/28/15 2015 11/28/15 2015 -- --   110/71 100.7 ??F (38.2 ??C) Temporal 93 22 99 %         Physical Exam  Vital signs reviewed  general: Alert and oriented ??3  head: Atraumatic  eyes: Equal round reactive to light and accommodating, pupils are equal, round and reactive to light and accommodation  oropharynx: Clear and well hydrated  neck: Supple no lymphadenopathy  heart: Regular rate and rhythm, no murmurs  lungs: Clear to auscultation bilaterally  abdomen: Soft nontender, positive bowel sounds, no peritoneal findings.  Genitourinary examination both testicles are descended and nontender no signs of foreign is gangrene or abscess.  Testicle left is tender but not swollen.  Extremities: Moving all fours, no tenderness.  Normal capillary refill.  Skin: No rash or lesions  neurologically: Alert and oriented ??3,   DIAGNOSTIC RESULTS         RADIOLOGY:   Non-plain film images such as CT, Ultrasound and MRI are read by the radiologist. Plain radiographic images are visualized and preliminarily interpreted by the emergency physician with the below findings:    Ultrasound testicle showed normal flow.  CT abdomen showed no kidney stone both reports were reviewed  per radiology.    Interpretation per the Radiologist below, if available at the time of this note:    CT Abdomen Pelvis Wo Contrast   Final Result      US SCROTUM AND TESTICLES   Final Result            ED BEDSIDE ULTRASOUND:   Performed by ED Physician - none    LABS:  Labs Reviewed   CBC WITH AUTO DIFFERENTIAL - Abnormal; Notable for  the following:        Result Value    Hemoglobin 12.6 (*)     Hematocrit 39.1 (*)     MCV 77.5 (*)     MCH 25.0 (*)     RDW 18.4 (*)     All other components within normal limits    Narrative:     Test Performed by [facility], [address],  [place], [place] [address]   BASIC METABOLIC PANEL - Abnormal; Notable for the following:     BUN 27 (*)     All other components within normal limits    Narrative:     Test Performed by [facility], [address]  [place], [place] [address]   URINALYSIS    Narrative:     Test Performed by [facility], [address]  [place], [place] [address]   URINALYSIS WITH MICROSCOPIC    Narrative:     Test Performed by [facility], [address],  [place], [place] [address]       All other labs were within normal range or not returned as of this dictation.    EMERGENCY DEPARTMENT COURSE and DIFFERENTIAL DIAGNOSIS/MDM:   Vitals:    Vitals:    11/28/15 2015 11/28/15 2024   BP: 110/71    Pulse: 93    Resp: 22    Temp:  100.7 ??F (38.2 ??C)   TempSrc:  Temporal   SpO2: 99%        Emergency Department course: No evidence of bowel obstruction.  No evidence of acute surgical abdominal process.  Given IV fluids and pain control.  Patient be sent back to [facility].  Follow-up with his doctor in 2 days.  Return here if any problems or concerns.  Continue pain control at home.  Please note initial temperature was 100.7 done with temporal thermometer.  I rechecked it myself with oral temperature essentially is 98.1.  I do not believe this is fever or bacteremia or sepsis.  He has no pain now.  Discharge home stable.    MDM    CRITICAL CARE TIME   Total Critical Care time was 0 minutes, excluding separately reportable procedures.  There was a high probability of clinically significant/life threatening deterioration in the patient's condition which required my urgent intervention.     CONSULTS:  None    PROCEDURES:  Unless otherwise noted below, none     Procedures    FINAL  IMPRESSION      1. Groin pain, unspecified laterality          DISPOSITION/PLAN   DISPOSITION Decision to Discharge    PATIENT REFERRED TO:  Elvera Lennox, DO  [address]  [place] [address]  [phone]      Follow-up in 2-4 days      DISCHARGE MEDICATIONS:  New Prescriptions    No medications on file          (Comment: Please note this report has been  produced using speech recognition software and may contain errors related to that system including errors in grammar, punctuation, and spelling, as well as words and phrases that may be inappropriate.  If there is any questions or concerns please feel free to contact the dictating provider for clarification).    Laverle Hobby, MD (electronically signed)  Attending Emergency Physician         Laverle Hobby, MD  11/28/15 [phone]

## 2015-11-28 NOTE — ED Notes (Signed)
Patient is at [facility] for rehab and was outside smoking he states he had a sudden onset of sharp scrotal pain that has not went away. Patient A&O x 3 respirations were panting upon arrival, coached to slow respirations down, pulses x 4 no edema noted. 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 He states his left testicle is more painful.     Ceasar Mons, RN  11/28/15 [phone]

## 2015-11-28 NOTE — ED Notes (Signed)
Bed: 37  Expected date:   Expected time:   Means of arrival:   Comments:  BFD - 66M     Nyra Market, RN  11/28/15 2014

## 2015-11-28 NOTE — ED Notes (Signed)
Patient ambulated to bathroom     Ceasar Mons, RN  11/28/15 [phone]

## 2015-11-28 NOTE — Other (Unsigned)
Patient Acct Nbr: [id number]   Primary AUTH/CERT:   Primary Insurance Company Name: [employer]  Primary Insurance Plan name: Medicare A  Primary Insurance Group Number:   Primary Insurance Plan Type: [place] A  Primary Insurance Policy Number: [id number]    Secondary AUTH/CERT:   Secondary Insurance Company Name: [employer]  Secondary Insurance Plan name: Medicare B  Secondary Insurance Group Number:   Secondary Insurance Plan Type: [employer] Insurance Policy Number: [id number]

## 2015-11-29 ENCOUNTER — Inpatient Hospital Stay
Admit: 2015-11-29 | Discharge: 2015-11-29 | Disposition: A | Discharge status: Skilled Nursing Facility | Attending: Emergency Medicine

## 2015-11-29 LAB — CBC WITH AUTO DIFFERENTIAL
Absolute Baso #: 0.1 10*3/uL (ref 0.0–0.2)
Absolute Eos #: 0.2 10*3/uL (ref 0.0–0.5)
Absolute Lymph #: 1.3 10*3/uL (ref 1.0–4.3)
Absolute Mono #: 0.6 10*3/uL (ref 0.0–0.8)
Absolute Neut #: 4.4 10*3/uL (ref 1.8–7.0)
Basophils: 1.2 %
Eosinophils: 2.3 %
Granulocytes %: 67.3 %
Hematocrit: 39.1 % — ABNORMAL LOW (ref 40.0–52.0)
Hemoglobin: 12.6 g/dL — ABNORMAL LOW (ref 13.0–18.0)
Lymphocyte %: 20.6 %
MCH: 25 pg — ABNORMAL LOW (ref 26.0–34.0)
MCHC: 32.2 % (ref 32.0–36.0)
MCV: 77.5 fL — ABNORMAL LOW (ref 80.0–98.0)
MPV: 8.2 fL (ref 7.4–10.4)
Monocytes: 8.6 %
Platelets: 213 10*3/uL (ref 140–440)
RBC: 5.04 10*6/uL (ref 4.40–5.90)
RDW: 18.4 % — ABNORMAL HIGH (ref 11.5–14.5)
WBC: 6.5 10*3/uL (ref 3.6–10.7)

## 2015-11-29 LAB — BASIC METABOLIC PANEL
Anion Gap: 5 NA
BUN: 27 mg/dL — ABNORMAL HIGH (ref 7–25)
CO2: 29 mmol/L (ref 21–32)
Calcium: 8.7 mg/dL (ref 8.2–10.1)
Chloride: 105 mmol/L (ref 98–109)
Creatinine: 1.39 mg/dL (ref 0.55–1.40)
EGFR IF NonAfrican American: 54.1 mL/min (ref >60)
Glucose: 80 mg/dL (ref 70–100)
Potassium: 4 mmol/L (ref 3.5–5.1)
Sodium: 139 mmol/L (ref 135–145)
eGFR African American: >60 mL/min (ref >60)

## 2015-11-29 LAB — URINALYSIS
Bilirubin, Urine: NEGATIVE NA
Glucose, Ur: NEGATIVE mg/dL
Ketones, Urine: NEGATIVE mg/dL
Nitrite, Urine: NEGATIVE NA
Occult Blood,Urine: NEGATIVE {RBC}/uL
Specific Gravity, Urine: 1.019 NA (ref 1.005–1.030)
Total Protein, Urine: NEGATIVE mg/dL
Urobilinogen, Urine: 0.2 mg/dL (ref 0–1)
pH, Urine: 6.5 NA (ref 5.0–8.0)

## 2015-11-29 LAB — URINALYSIS WITH MICROSCOPIC: WBC, UA: 2 /HPF (ref 0–5)

## 2015-11-29 MED ORDER — HYDROMORPHONE HCL 1 MG/ML IJ SOLN
1 MG/ML | Freq: Once | INTRAMUSCULAR | Status: AC
Start: 2015-11-29 — End: 2015-11-28
  Administered 2015-11-29: 02:00:00 0.5 mg via INTRAVENOUS

## 2015-11-29 MED ORDER — SODIUM CHLORIDE 0.9 % IV BOLUS
0.9 % | Freq: Once | INTRAVENOUS | Status: AC
Start: 2015-11-29 — End: 2015-11-28
  Administered 2015-11-29: 01:00:00 1000 mL via INTRAVENOUS

## 2015-11-29 MED ORDER — ONDANSETRON HCL 4 MG/2ML IJ SOLN
4 MG/2ML | Freq: Once | INTRAMUSCULAR | Status: AC
Start: 2015-11-29 — End: 2015-11-28
  Administered 2015-11-29: 01:00:00 4 mg via INTRAVENOUS

## 2015-11-29 MED FILL — HYDROMORPHONE HCL 1 MG/ML IJ SOLN: 1 MG/ML | INTRAMUSCULAR | Qty: 1

## 2015-11-29 MED FILL — ONDANSETRON HCL 4 MG/2ML IJ SOLN: 4 MG/2ML | INTRAMUSCULAR | Qty: 2

## 2015-11-30 HISTORY — PX: EXPLORATORY LAPAROTOMY: SUR591

## 2017-03-01 HISTORY — PX: COLONOSCOPY: SHX174

## 2017-03-01 HISTORY — PX: ESOPHAGOGASTRODUODENOSCOPY: SHX1529

## 2017-06-22 ENCOUNTER — Other Ambulatory Visit: Payer: Self-pay

## 2017-06-22 ENCOUNTER — Encounter (HOSPITAL_COMMUNITY): Payer: Self-pay | Admitting: Emergency Medicine

## 2017-06-22 ENCOUNTER — Emergency Department (HOSPITAL_COMMUNITY): Payer: Medicare Other

## 2017-06-22 ENCOUNTER — Emergency Department (HOSPITAL_COMMUNITY)
Admission: EM | Admit: 2017-06-22 | Discharge: 2017-06-23 | Disposition: A | Discharge status: Home / Self Care | Payer: Medicare Other | Attending: Emergency Medicine | Admitting: Emergency Medicine

## 2017-06-22 DIAGNOSIS — R109 Unspecified abdominal pain: Secondary | ICD-10-CM

## 2017-06-22 DIAGNOSIS — R1031 Right lower quadrant pain: Secondary | ICD-10-CM | POA: Insufficient documentation

## 2017-06-22 DIAGNOSIS — F17200 Nicotine dependence, unspecified, uncomplicated: Secondary | ICD-10-CM | POA: Insufficient documentation

## 2017-06-22 LAB — CBC WITH DIFFERENTIAL/PLATELET
BASOS ABS: 0 10*3/uL (ref 0.0–0.1)
Basophils Relative: 0 %
EOS PCT: 2 %
Eosinophils Absolute: 0.1 10*3/uL (ref 0.0–0.7)
HEMATOCRIT: 29.1 % — AB (ref 39.0–52.0)
HEMOGLOBIN: 8.6 g/dL — AB (ref 13.0–17.0)
LYMPHS ABS: 0.5 10*3/uL — AB (ref 0.7–4.0)
Lymphocytes Relative: 10 %
MCH: 20.6 pg — ABNORMAL LOW (ref 26.0–34.0)
MCHC: 29.6 g/dL — ABNORMAL LOW (ref 30.0–36.0)
MCV: 69.6 fL — AB (ref 78.0–100.0)
Monocytes Absolute: 0.8 10*3/uL (ref 0.1–1.0)
Monocytes Relative: 14 %
NEUTROS ABS: 4 10*3/uL (ref 1.7–7.7)
Neutrophils Relative %: 74 %
PLATELETS: 300 10*3/uL (ref 150–400)
RBC: 4.18 MIL/uL — AB (ref 4.22–5.81)
RDW: 17.9 % — ABNORMAL HIGH (ref 11.5–15.5)
WBC: 5.5 10*3/uL (ref 4.0–10.5)

## 2017-06-22 LAB — COMPREHENSIVE METABOLIC PANEL
ALK PHOS: 27 U/L — AB (ref 38–126)
ALT: 19 U/L (ref 17–63)
AST: 22 U/L (ref 15–41)
Albumin: 3 g/dL — ABNORMAL LOW (ref 3.5–5.0)
Anion gap: 9 (ref 5–15)
BILIRUBIN TOTAL: 0.1 mg/dL — AB (ref 0.3–1.2)
BUN: 21 mg/dL — ABNORMAL HIGH (ref 6–20)
CO2: 22 mmol/L (ref 22–32)
CREATININE: 1.37 mg/dL — AB (ref 0.61–1.24)
Calcium: 8.2 mg/dL — ABNORMAL LOW (ref 8.9–10.3)
Chloride: 107 mmol/L (ref 101–111)
GFR calc Af Amer: >60 mL/min (ref >60)
GFR, EST NON AFRICAN AMERICAN: 58 mL/min — AB (ref >60)
Glucose, Bld: 103 mg/dL — ABNORMAL HIGH (ref 65–99)
Potassium: 4.1 mmol/L (ref 3.5–5.1)
Sodium: 138 mmol/L (ref 135–145)
TOTAL PROTEIN: 6.4 g/dL — AB (ref 6.5–8.1)

## 2017-06-22 LAB — SEDIMENTATION RATE: Sed Rate: 31 mm/hr — ABNORMAL HIGH (ref 0–16)

## 2017-06-22 MED ORDER — SODIUM CHLORIDE 0.9 % IV BOLUS (SEPSIS)
1000.0000 mL | Freq: Once | INTRAVENOUS | Status: AC
  Administered 2017-06-22: 1000 mL via INTRAVENOUS

## 2017-06-22 MED ORDER — HYDROMORPHONE HCL 1 MG/ML IJ SOLN
1.0000 mg | Freq: Once | INTRAMUSCULAR | Status: AC
  Administered 2017-06-22: 1 mg via INTRAVENOUS
  Filled 2017-06-22: qty 1

## 2017-06-22 MED ORDER — HEPARIN SOD (PORK) LOCK FLUSH 100 UNIT/ML IV SOLN
INTRAVENOUS | Status: AC
  Administered 2017-06-23: 500 [IU]
  Filled 2017-06-22: qty 5

## 2017-06-22 MED ORDER — OXYCODONE-ACETAMINOPHEN 5-325 MG PO TABS: 1.0000 | ORAL_TABLET | ORAL | 0 refills | Status: DC | PRN

## 2017-06-22 MED ORDER — IOPAMIDOL (ISOVUE-300) INJECTION 61%
100.0000 mL | Freq: Once | INTRAVENOUS | Status: AC | PRN
  Administered 2017-06-22: 100 mL via INTRAVENOUS

## 2017-06-22 MED ORDER — ONDANSETRON HCL 4 MG PO TABS: 4.0000 mg | ORAL_TABLET | Freq: Three times a day (TID) | ORAL | 0 refills | Status: DC | PRN

## 2017-06-22 MED ORDER — PROMETHAZINE HCL 25 MG/ML IJ SOLN
12.5000 mg | Freq: Once | INTRAMUSCULAR | Status: AC
  Administered 2017-06-22: 12.5 mg via INTRAVENOUS
  Filled 2017-06-22: qty 1

## 2017-06-22 NOTE — ED Notes (Signed)
Pt states he has not been able to urinate in 3 days but has been having multiple liquid stools a day.

## 2017-06-22 NOTE — ED Triage Notes (Signed)
Patient has port in his L chest.

## 2017-06-22 NOTE — ED Notes (Signed)
Pt expressed concerns about his code status to the RN. Pt wishes to be a DNR. RN notified EDP Mesner, MD.

## 2017-06-22 NOTE — ED Notes (Signed)
Pt able to urinate for first time in 3 days. 249ml

## 2017-06-22 NOTE — ED Notes (Signed)
Pt states he has been trying to replenish fluids all week and feels water drunk from drinking so much water.

## 2017-06-22 NOTE — ED Triage Notes (Signed)
Patient reports Crohn's flare-up that started 4 days ago. Patient with cough x 1 week. History of MS.

## 2017-06-22 NOTE — ED Provider Notes (Signed)
Emergency Department Provider Note   I have reviewed the triage vital signs and the nursing notes.   HISTORY  Chief Complaint Crohn's Disease   HPI Walter Diaz is a 52 y.o. male with past medical history of Crohn's disease and Humira induced multiple sclerosis who presents to the emergency department today secondary to what he thinks is a Crohn's flare.  Patient states he is here from [place] to help his mother and stepfather and for the last 5-6 days of progressively worsening right lower quadrant abdominal pain similar to previous episodes of Crohn's flares.  He has had episodes of vomiting and some spotting blood diarrhea.  States this is about her normal flare form.  Usually needs to be hospitalized for these.  At home medications have not worked.  Is also has some cough and congestion and had consistently elevated temperature but no fever.  No rashes or trauma.  Decreased urination the last few days. No other associated or modifying symptoms.    Past Medical History:  Diagnosis Date  . Crohn disease ([place])   . Multiple sclerosis ([place])     There are no active problems to display for this patient.   Past Surgical History:  Procedure Laterality Date  . APPENDECTOMY    . HERNIA REPAIR    . SMALL BOWEL REPAIR      Current Outpatient Rx  . Order #: [id number] Class: Historical Med  . Order #: [id number] Class: Historical Med  . Order #: [id number] Class: Historical Med  . Order #: [id number] Class: Historical Med  . Order #: [id number] Class: Historical Med  . Order #: [id number] Class: Historical Med  . Order #: [id number] Class: Historical Med  . Order #: [id number] Class: Print  . Order #: [id number] Class: Print  . Order #: [id number] Class: Print  . Order #: [id number] Class: Print    Allergies Hctz [hydrochlorothiazide] and Morphine and related  Family History  Problem Relation Age of Onset  . Cancer Father     Social History Social History   Tobacco Use  . Smoking  status: Current Every Day Smoker  . Smokeless tobacco: Never Used  Substance Use Topics  . Alcohol use: No  . Drug use: No    Review of Systems  All other systems negative except as documented in the HPI. All pertinent positives and negatives as reviewed in the HPI. ____________________________________________   PHYSICAL EXAM:  VITAL SIGNS: ED Triage Vitals  Enc Vitals Group     BP 06/22/17 1927 (!) 153/82     Pulse Rate 06/22/17 1927 (!) 117     Resp 06/22/17 1927 19     Temp 06/22/17 1927 100.1 F (37.8 C)     Temp Source 06/22/17 1927 Oral     SpO2 06/22/17 1927 100 %     Weight 06/22/17 1927 180 lb (81.6 kg)     Height 06/22/17 1927 6' (1.829 m)     Head Circumference --      Peak Flow --      Pain Score 06/22/17 1929 8     Pain Loc --      Pain Edu? --      Excl. in [place]? --     Constitutional: Alert and oriented. Well appearing and in no acute distress. Eyes: Conjunctivae are normal. PERRL. EOMI. Head: Atraumatic. Nose: No congestion/rhinnorhea. Mouth/Throat: Mucous membranes are moist.  Oropharynx non-erythematous. Neck: No stridor.  No meningeal signs.   Cardiovascular: tachycardic rate, regular rhythm. Good peripheral circulation. Grossly normal heart sounds.   Respiratory: Normal  respiratory effort.  No retractions. Lungs CTAB. Gastrointestinal: Soft and ttp to RLQ. No distention.  Musculoskeletal: No lower extremity tenderness nor edema. No gross deformities of extremities. Neurologic:  Normal speech and language. No gross focal neurologic deficits are appreciated.  Skin:  Skin is warm, dry and intact. No rash noted.   ____________________________________________   LABS (all labs ordered are listed, but only abnormal results are displayed)  Labs Reviewed  CBC WITH DIFFERENTIAL/PLATELET - Abnormal; Notable for the following components:      Result Value   RBC 4.18 (*)    Hemoglobin 8.6 (*)    HCT 29.1 (*)    MCV 69.6 (*)    MCH 20.6 (*)    MCHC  29.6 (*)    RDW 17.9 (*)    Lymphs Abs 0.5 (*)    All other components within normal limits  COMPREHENSIVE METABOLIC PANEL - Abnormal; Notable for the following components:   Glucose, Bld 103 (*)    BUN 21 (*)    Creatinine, Ser 1.37 (*)    Calcium 8.2 (*)    Total Protein 6.4 (*)    Albumin 3.0 (*)    Alkaline Phosphatase 27 (*)    Total Bilirubin 0.1 (*)    GFR calc non Af Amer 58 (*)    All other components within normal limits  SEDIMENTATION RATE - Abnormal; Notable for the following components:   Sed Rate 31 (*)    All other components within normal limits  C-REACTIVE PROTEIN   ____________________________________________  RADIOLOGY  Dg Chest 2 View  Result Date: 06/22/2017 CLINICAL DATA:  Productive cough x1 week. EXAM: CHEST  2 VIEW COMPARISON:  05/06/2014 FINDINGS: The heart size and mediastinal contours are within normal limits. Port catheter tip is noted in the mid SVC. Both lungs are clear. The visualized skeletal structures are unremarkable. IMPRESSION: No active cardiopulmonary disease. Electronically Signed   By: Ashley Royalty M.D.   On: 06/22/2017 20:24   Ct Abdomen Pelvis W Contrast  Result Date: 06/22/2017 CLINICAL DATA:  History of Crohn's, unable to urinate for 3 days history of MS EXAM: CT ABDOMEN AND PELVIS WITH CONTRAST TECHNIQUE: Multidetector CT imaging of the abdomen and pelvis was performed using the standard protocol following bolus administration of intravenous contrast. CONTRAST:  132mL ISOVUE-300 IOPAMIDOL (ISOVUE-300) INJECTION 61% COMPARISON:  CT 09/15/2014, 07/30/2014, 05/26/2014, 05/06/2014, 02/20/2014 FINDINGS: Lower chest: Lung bases demonstrate no acute consolidation or effusion. Normal heart size. Hepatobiliary: No focal liver abnormality is seen. No gallstones, gallbladder wall thickening, or biliary dilatation. Pancreas: Unremarkable. No pancreatic ductal dilatation or surrounding inflammatory changes. Spleen: Normal in size without focal  abnormality. Adrenals/Urinary Tract: Adrenal glands are within normal limits. Multiple cysts in the right kidney. Subcentimeter hypodensity mid left kidney too small to further characterize. No hydronephrosis. The bladder is normal Stomach/Bowel: Right hemicolectomy changes with small bowel colon anastomosis in the upper abdomen, at the midline. Diffuse fluid within the residual colon. Focal area of narrowing within the rectosigmoid colon, series 2, image number 66. There is mild dilatation of the small bowel upstream of the anastomosis measuring up to 4.8 cm. No definite small bowel thickening. Remainder of the small bowel is collapsed. The stomach is within normal limits. Vascular/Lymphatic: Nonaneurysmal aorta. No significantly enlarged lymph nodes. Reproductive: Slightly enlarged prostate with coarse calcification Other: No free air or free fluid. Fat in the inguinal canals bilaterally. Musculoskeletal: Stable lucent lesion in L2. No acute or suspicious lesion. Mild retrolisthesis of L4 on L5. IMPRESSION:  1. Kidneys show no hydronephrosis or acute abnormality. 2. Status post right hemi colectomy with small bowel colon anastomosis noted within the anterior abdomen at the midline. The segment of small bowel immediately upstream to the anastomosis is slightly dilated and fluid-filled. The remainder of the small bowel is unremarkable. There is a focal area of narrowing and soft tissue thickening in the rectosigmoid colon which may be secondary to spasm, stricture, or focal area of acute disease although not much surrounding inflammation. Electronically Signed   By: Donavan Foil M.D.   On: 06/22/2017 21:42    ____________________________________________   PROCEDURES  Procedure(s) performed:   Procedures   ____________________________________________   INITIAL IMPRESSION / ASSESSMENT AND PLAN / ED COURSE  Pain meds/nausea meds/fluids/ct scan and labs.   Symptoms improved. No worrisome findings on  labs/ct. Stable for dc w/ outpatient treatment.     Pertinent labs & imaging results that were available during my care of the patient were reviewed by me and considered in my medical decision making (see chart for details).  ____________________________________________  FINAL CLINICAL IMPRESSION(S) / ED DIAGNOSES  Final diagnoses:  Abdominal pain, unspecified abdominal location     MEDICATIONS GIVEN DURING THIS VISIT:  Medications  sodium chloride 0.9 % bolus 1,000 mL (0 mLs Intravenous Stopped 06/23/17 0011)  HYDROmorphone (DILAUDID) injection 1 mg (1 mg Intravenous Given 06/22/17 2047)  promethazine (PHENERGAN) injection 12.5 mg (12.5 mg Intravenous Given 06/22/17 2048)  iopamidol (ISOVUE-300) 61 % injection 100 mL (100 mLs Intravenous Contrast Given 06/22/17 2118)  HYDROmorphone (DILAUDID) injection 1 mg (1 mg Intravenous Given 06/22/17 2212)  sodium chloride 0.9 % bolus 1,000 mL (0 mLs Intravenous Stopped 06/23/17 0010)  heparin lock flush 100 UNIT/ML injection (500 Units  Given 06/23/17 0010)     NEW OUTPATIENT MEDICATIONS STARTED DURING THIS VISIT:  Discharge Medication List as of 06/22/2017 11:05 PM    START taking these medications   Details  !! ondansetron (ZOFRAN) 4 MG tablet Take 1 tablet (4 mg total) by mouth every 8 (eight) hours as needed for nausea or vomiting., Starting Fri 06/22/2017, Print    !! ondansetron (ZOFRAN) 4 MG tablet Take 1 tablet (4 mg total) by mouth every 8 (eight) hours as needed for nausea or vomiting., Starting Fri 06/22/2017, Print    !! oxyCODONE-acetaminophen (PERCOCET) 5-325 MG tablet Take 1-2 tablets by mouth every 4 (four) hours as needed., Starting Fri 06/22/2017, Print    !! oxyCODONE-acetaminophen (PERCOCET/ROXICET) 5-325 MG tablet Take 1-2 tablets by mouth every 4 (four) hours as needed for severe pain., Starting Fri 06/22/2017, Print     !! - Potential duplicate medications found. Please discuss with provider.      Note:  This note was  prepared with assistance of Dragon voice recognition software. Occasional wrong-word or sound-a-like substitutions may have occurred due to the inherent limitations of voice recognition software.   Athene Schuhmacher, Corene Cornea, MD 06/23/17 [phone]

## 2017-06-23 LAB — C-REACTIVE PROTEIN: CRP: 6.4 mg/dL — ABNORMAL HIGH (ref <1.0)

## 2017-06-25 MED FILL — Ondansetron HCl Tab 4 MG: ORAL | Qty: 4 | Status: AC

## 2017-06-25 MED FILL — Oxycodone w/ Acetaminophen Tab 5-325 MG: ORAL | Qty: 6 | Status: AC

## 2017-07-01 ENCOUNTER — Emergency Department (HOSPITAL_COMMUNITY): Payer: Medicare Other

## 2017-07-01 ENCOUNTER — Inpatient Hospital Stay (HOSPITAL_COMMUNITY)
Admission: EM | Admit: 2017-07-01 | Discharge: 2017-07-03 | DRG: 386 | Disposition: A | Discharge status: Home / Self Care | Payer: Medicare Other | Attending: Internal Medicine | Admitting: Internal Medicine

## 2017-07-01 ENCOUNTER — Other Ambulatory Visit: Payer: Self-pay

## 2017-07-01 ENCOUNTER — Encounter (HOSPITAL_COMMUNITY): Payer: Self-pay | Admitting: Emergency Medicine

## 2017-07-01 DIAGNOSIS — G894 Chronic pain syndrome: Secondary | ICD-10-CM | POA: Diagnosis present

## 2017-07-01 DIAGNOSIS — F329 Major depressive disorder, single episode, unspecified: Secondary | ICD-10-CM | POA: Diagnosis present

## 2017-07-01 DIAGNOSIS — H532 Diplopia: Secondary | ICD-10-CM

## 2017-07-01 DIAGNOSIS — F17200 Nicotine dependence, unspecified, uncomplicated: Secondary | ICD-10-CM | POA: Diagnosis present

## 2017-07-01 DIAGNOSIS — K5000 Crohn's disease of small intestine without complications: Principal | ICD-10-CM | POA: Diagnosis present

## 2017-07-01 DIAGNOSIS — Z888 Allergy status to other drugs, medicaments and biological substances status: Secondary | ICD-10-CM

## 2017-07-01 DIAGNOSIS — K909 Intestinal malabsorption, unspecified: Secondary | ICD-10-CM | POA: Diagnosis present

## 2017-07-01 DIAGNOSIS — Z66 Do not resuscitate: Secondary | ICD-10-CM | POA: Diagnosis present

## 2017-07-01 DIAGNOSIS — K50119 Crohn's disease of large intestine with unspecified complications: Secondary | ICD-10-CM

## 2017-07-01 DIAGNOSIS — K50111 Crohn's disease of large intestine with rectal bleeding: Secondary | ICD-10-CM | POA: Diagnosis not present

## 2017-07-01 DIAGNOSIS — D649 Anemia, unspecified: Secondary | ICD-10-CM

## 2017-07-01 DIAGNOSIS — K50911 Crohn's disease, unspecified, with rectal bleeding: Secondary | ICD-10-CM

## 2017-07-01 DIAGNOSIS — Z79899 Other long term (current) drug therapy: Secondary | ICD-10-CM

## 2017-07-01 DIAGNOSIS — Z7982 Long term (current) use of aspirin: Secondary | ICD-10-CM

## 2017-07-01 DIAGNOSIS — K219 Gastro-esophageal reflux disease without esophagitis: Secondary | ICD-10-CM | POA: Diagnosis present

## 2017-07-01 DIAGNOSIS — K5010 Crohn's disease of large intestine without complications: Secondary | ICD-10-CM

## 2017-07-01 DIAGNOSIS — G35 Multiple sclerosis: Secondary | ICD-10-CM | POA: Diagnosis present

## 2017-07-01 DIAGNOSIS — Z23 Encounter for immunization: Secondary | ICD-10-CM

## 2017-07-01 DIAGNOSIS — D500 Iron deficiency anemia secondary to blood loss (chronic): Secondary | ICD-10-CM

## 2017-07-01 DIAGNOSIS — E860 Dehydration: Secondary | ICD-10-CM | POA: Diagnosis present

## 2017-07-01 DIAGNOSIS — Z885 Allergy status to narcotic agent status: Secondary | ICD-10-CM

## 2017-07-01 DIAGNOSIS — F419 Anxiety disorder, unspecified: Secondary | ICD-10-CM | POA: Diagnosis present

## 2017-07-01 DIAGNOSIS — K5090 Crohn's disease, unspecified, without complications: Secondary | ICD-10-CM | POA: Diagnosis present

## 2017-07-01 HISTORY — DX: Sleep apnea, unspecified: G47.30

## 2017-07-01 HISTORY — DX: Type 2 diabetes mellitus without complications: E11.9

## 2017-07-01 HISTORY — DX: Unspecified osteoarthritis, unspecified site: M19.90

## 2017-07-01 HISTORY — DX: Unspecified asthma, uncomplicated: J45.909

## 2017-07-01 HISTORY — DX: Malignant (primary) neoplasm, unspecified: C80.1

## 2017-07-01 HISTORY — DX: Essential (primary) hypertension: I10

## 2017-07-01 HISTORY — DX: Heart failure, unspecified: I50.9

## 2017-07-01 HISTORY — DX: Anemia, unspecified: D64.9

## 2017-07-01 HISTORY — DX: Chronic kidney disease, unspecified: N18.9

## 2017-07-01 HISTORY — DX: Rheumatoid arthritis, unspecified: M06.9

## 2017-07-01 LAB — HEMATOCRIT: HCT: 27.6 % — ABNORMAL LOW (ref 39.0–52.0)

## 2017-07-01 LAB — URINALYSIS, ROUTINE W REFLEX MICROSCOPIC
BILIRUBIN URINE: NEGATIVE
GLUCOSE, UA: NEGATIVE mg/dL
Hgb urine dipstick: NEGATIVE
KETONES UR: NEGATIVE mg/dL
LEUKOCYTES UA: NEGATIVE
Nitrite: NEGATIVE
PH: 5 (ref 5.0–8.0)
Protein, ur: NEGATIVE mg/dL
SPECIFIC GRAVITY, URINE: 1.032 — AB (ref 1.005–1.030)

## 2017-07-01 LAB — CBC WITH DIFFERENTIAL/PLATELET
BASOS ABS: 0 10*3/uL (ref 0.0–0.1)
Basophils Relative: 0 %
EOS ABS: 0.1 10*3/uL (ref 0.0–0.7)
Eosinophils Relative: 1 %
HCT: 28.6 % — ABNORMAL LOW (ref 39.0–52.0)
HEMOGLOBIN: 8.4 g/dL — AB (ref 13.0–17.0)
LYMPHS PCT: 12 %
Lymphs Abs: 1.2 10*3/uL (ref 0.7–4.0)
MCH: 20.3 pg — ABNORMAL LOW (ref 26.0–34.0)
MCHC: 29.4 g/dL — ABNORMAL LOW (ref 30.0–36.0)
MCV: 69.2 fL — ABNORMAL LOW (ref 78.0–100.0)
Monocytes Absolute: 0.8 10*3/uL (ref 0.1–1.0)
Monocytes Relative: 8 %
NEUTROS PCT: 79 %
Neutro Abs: 7.7 10*3/uL (ref 1.7–7.7)
Platelets: 479 10*3/uL — ABNORMAL HIGH (ref 150–400)
RBC: 4.13 MIL/uL — AB (ref 4.22–5.81)
RDW: 17.7 % — ABNORMAL HIGH (ref 11.5–15.5)
WBC: 9.8 10*3/uL (ref 4.0–10.5)

## 2017-07-01 LAB — COMPREHENSIVE METABOLIC PANEL
ALBUMIN: 3.3 g/dL — AB (ref 3.5–5.0)
ALK PHOS: 35 U/L — AB (ref 38–126)
ALT: 30 U/L (ref 17–63)
AST: 21 U/L (ref 15–41)
Anion gap: 9 (ref 5–15)
BUN: 31 mg/dL — ABNORMAL HIGH (ref 6–20)
CALCIUM: 9.1 mg/dL (ref 8.9–10.3)
CO2: 22 mmol/L (ref 22–32)
CREATININE: 1.28 mg/dL — AB (ref 0.61–1.24)
Chloride: 109 mmol/L (ref 101–111)
GFR calc Af Amer: >60 mL/min (ref >60)
GFR calc non Af Amer: >60 mL/min (ref >60)
GLUCOSE: 98 mg/dL (ref 65–99)
Potassium: 4.7 mmol/L (ref 3.5–5.1)
SODIUM: 140 mmol/L (ref 135–145)
Total Bilirubin: 0.3 mg/dL (ref 0.3–1.2)
Total Protein: 6.7 g/dL (ref 6.5–8.1)

## 2017-07-01 LAB — LIPASE, BLOOD: Lipase: 48 U/L (ref 11–51)

## 2017-07-01 LAB — POC OCCULT BLOOD, ED: FECAL OCCULT BLD: POSITIVE — AB

## 2017-07-01 LAB — HEMOGLOBIN: HEMOGLOBIN: 8 g/dL — AB (ref 13.0–17.0)

## 2017-07-01 MED ORDER — PANTOPRAZOLE SODIUM 40 MG IV SOLR
40.0000 mg | INTRAVENOUS | Status: DC
  Administered 2017-07-02 (×2): 40 mg via INTRAVENOUS
  Filled 2017-07-01 (×2): qty 40

## 2017-07-01 MED ORDER — ONDANSETRON HCL 4 MG PO TABS: 4.0000 mg | ORAL_TABLET | Freq: Four times a day (QID) | ORAL | Status: DC | PRN

## 2017-07-01 MED ORDER — LACTATED RINGERS IV SOLN
INTRAVENOUS | Status: DC
  Administered 2017-07-01 – 2017-07-02 (×2): via INTRAVENOUS

## 2017-07-01 MED ORDER — PAROXETINE HCL 20 MG PO TABS
20.0000 mg | ORAL_TABLET | Freq: Every day | ORAL | Status: DC
  Administered 2017-07-02 (×2): 20 mg via ORAL
  Filled 2017-07-01 (×2): qty 1

## 2017-07-01 MED ORDER — HYDROMORPHONE HCL 1 MG/ML IJ SOLN
1.0000 mg | Freq: Once | INTRAMUSCULAR | Status: AC
  Administered 2017-07-01: 1 mg via INTRAVENOUS
  Filled 2017-07-01: qty 1

## 2017-07-01 MED ORDER — METHYLPREDNISOLONE SODIUM SUCC 40 MG IJ SOLR
20.0000 mg | Freq: Three times a day (TID) | INTRAMUSCULAR | Status: DC
  Administered 2017-07-02 – 2017-07-03 (×5): 20 mg via INTRAVENOUS
  Filled 2017-07-01 (×5): qty 1

## 2017-07-01 MED ORDER — AMPHETAMINE-DEXTROAMPHETAMINE 10 MG PO TABS
10.0000 mg | ORAL_TABLET | Freq: Two times a day (BID) | ORAL | Status: DC
  Administered 2017-07-02 – 2017-07-03 (×4): 10 mg via ORAL
  Filled 2017-07-01 (×4): qty 1

## 2017-07-01 MED ORDER — ACETAMINOPHEN 325 MG PO TABS: 650.0000 mg | ORAL_TABLET | Freq: Four times a day (QID) | ORAL | Status: DC | PRN

## 2017-07-01 MED ORDER — ONDANSETRON HCL 4 MG/2ML IJ SOLN: 4.0000 mg | Freq: Four times a day (QID) | INTRAMUSCULAR | Status: DC | PRN

## 2017-07-01 MED ORDER — SODIUM CHLORIDE 0.9 % IV BOLUS (SEPSIS)
1000.0000 mL | Freq: Once | INTRAVENOUS | Status: AC
  Administered 2017-07-01: 1000 mL via INTRAVENOUS

## 2017-07-01 MED ORDER — IOPAMIDOL (ISOVUE-300) INJECTION 61%
100.0000 mL | Freq: Once | INTRAVENOUS | Status: AC | PRN
  Administered 2017-07-01: 100 mL via INTRAVENOUS

## 2017-07-01 MED ORDER — HYDROMORPHONE HCL 1 MG/ML IJ SOLN
1.0000 mg | INTRAMUSCULAR | Status: DC | PRN
  Administered 2017-07-02 – 2017-07-03 (×10): 1 mg via INTRAVENOUS
  Filled 2017-07-01 (×10): qty 1

## 2017-07-01 MED ORDER — PIPERACILLIN-TAZOBACTAM 3.375 G IVPB
3.3750 g | Freq: Three times a day (TID) | INTRAVENOUS | Status: DC
  Administered 2017-07-02 (×2): 3.375 g via INTRAVENOUS
  Filled 2017-07-01 (×2): qty 50

## 2017-07-01 MED ORDER — MORPHINE SULFATE (PF) 4 MG/ML IV SOLN
4.0000 mg | Freq: Once | INTRAVENOUS | Status: DC
  Filled 2017-07-01: qty 1

## 2017-07-01 MED ORDER — PREGABALIN 75 MG PO CAPS
75.0000 mg | ORAL_CAPSULE | Freq: Two times a day (BID) | ORAL | Status: DC
  Administered 2017-07-02 – 2017-07-03 (×4): 75 mg via ORAL
  Filled 2017-07-01 (×4): qty 1

## 2017-07-01 MED ORDER — INFLUENZA VAC SPLIT QUAD 0.5 ML IM SUSY
0.5000 mL | PREFILLED_SYRINGE | INTRAMUSCULAR | Status: AC
  Administered 2017-07-02: 0.5 mL via INTRAMUSCULAR
  Filled 2017-07-01: qty 0.5

## 2017-07-01 MED ORDER — ONDANSETRON HCL 4 MG/2ML IJ SOLN
4.0000 mg | Freq: Once | INTRAMUSCULAR | Status: AC
  Administered 2017-07-01: 4 mg via INTRAVENOUS
  Filled 2017-07-01: qty 2

## 2017-07-01 MED ORDER — ACETAMINOPHEN 650 MG RE SUPP: 650.0000 mg | Freq: Four times a day (QID) | RECTAL | Status: DC | PRN

## 2017-07-01 MED ORDER — FENTANYL CITRATE (PF) 100 MCG/2ML IJ SOLN
50.0000 ug | Freq: Once | INTRAMUSCULAR | Status: AC
  Administered 2017-07-01: 50 ug via INTRAVENOUS
  Filled 2017-07-01: qty 2

## 2017-07-01 NOTE — ED Triage Notes (Signed)
Blood in stool x 1 week.  Hx of chrons

## 2017-07-01 NOTE — Progress Notes (Signed)
Pharmacy Antibiotic Note  Walter Diaz is a 52 y.o. male admitted on 07/01/2017 with intra abdominal infection.  Pharmacy has been consulted for zosyn dosing.  Plan: Zosyn 3.375g IV q8h (4 hour infusion).  Height: 6' (182.9 cm) Weight: 180 lb (81.6 kg) IBW/kg (Calculated) : 77.6  No data recorded.  Recent Labs  Lab 07/01/17 1535  WBC 9.8  CREATININE 1.28*    Estimated Creatinine Clearance: 74.9 mL/min (A) (by C-G formula based on SCr of 1.28 mg/dL (H)).    Allergies  Allergen Reactions  . Hctz [Hydrochlorothiazide] Anaphylaxis  . Morphine And Related Itching     Thank you for allowing pharmacy to be a part of this patient's care.  Greycen Felter, [place] 07/01/2017 9:12 PM

## 2017-07-01 NOTE — H&P (Addendum)
History and Physical    Blayden Conwell STM:[id number] DOB: [birth year] DOA: 07/01/2017  PCP: Patient, No Pcp Per   Patient coming from: Home  Chief Complaint: Lower abdominal pain with rectal bleeding  HPI: Tranell Wojtkiewicz is a 52 y.o. male with medical history significant for Crohn's disease with prior right hemicolectomy with small bowel colon anastomosis, multiple sclerosis secondary to Humira use, depression, and GERD who presented to the emergency department today after recent visit on 06/22/2017 for ongoing rectal bleeding along with lower abdominal pain that is predominant in the left lower quadrant.  He states that the pain is dull, and aching in nature with no specific aggravating or alleviating factors noted.  He reports multiple bowel movements per day, but this is not unusual for him.  He has had some mild nausea and vomiting with diminished oral intake despite trying a soft diet.  As a result, he believes he has become dehydrated and his urine output has diminished.  He also notes worsening double vision over the last 2 days.  He is from [place] and is actually visiting his mother in town and trying to establish residence here.  He was last seen here in 2015 by physicians at [facility].  He is currently seeing a gastroenterologist in [place] who has put him on Stelara due to his previous complications with Humira.  He states that he has not recently received a dose of his medication that he gets every 8 weeks and states that his previous dose was prior to Christmas.  He has a left chest wall port for easy IV access since he has had trouble with intravenous access in the past. He denies any fever or chills. He does report some mild nausea and an episode of vomiting earlier today. He denies any chest pain, shortness of breath, rhinorrhea, or congestion.   ED Course: Vital signs are stable and laboratory data is remarkable for hemoglobin of 8.4 which appears stable from 8 days prior during his ED visit.   Creatinine is 1.28.  There is no leukocytosis noted.  He has been given 2 L of IV fluid along with some Zofran, morphine, Dilaudid, and fentanyl for pain control.  CT scan of the abdomen and pelvis demonstrates some small bowel thickening within loops of intestine in the left lower quadrant.  He has not been started on any antibiotics or steroids.  Review of Systems: All others reviewed and otherwise negative.  Past Medical History:  Diagnosis Date  . Crohn disease ([place])   . Multiple sclerosis ([place])     Past Surgical History:  Procedure Laterality Date  . APPENDECTOMY    . HERNIA REPAIR    . SMALL BOWEL REPAIR       reports that he has been smoking.  he has never used smokeless tobacco. He reports that he does not drink alcohol or use drugs.  Allergies  Allergen Reactions  . Hctz [Hydrochlorothiazide] Anaphylaxis  . Morphine And Related Itching    Family History  Problem Relation Age of Onset  . Cancer Father     Prior to Admission medications   Medication Sig Start Date End Date Taking? Authorizing Provider  amphetamine-dextroamphetamine (ADDERALL) 10 MG tablet Take 10 mg by mouth 2 (two) times daily.   Yes [provider]  aspirin 500 MG tablet Take 2,000 mg by mouth every 6 (six) hours as needed for mild pain or moderate pain.   Yes [provider]  cyanocobalamin (,VITAMIN B-12,) 1000 MCG/ML injection Inject into  the muscle every 30 (thirty) days. 05/25/14  Yes [provider]  Multiple Vitamin (MULTIVITAMIN WITH MINERALS) TABS tablet Take 1 tablet by mouth daily.   Yes [provider]  ondansetron (ZOFRAN-ODT) 4 MG disintegrating tablet DISSOLVE 1 tablet ON THE TONGUE every 8 hours as needed for Nausea/Vomiting. 05/07/17  Yes [provider]  pantoprazole (PROTONIX) 40 MG tablet Take 40 mg by mouth daily. 03/21/17  Yes [provider]  PARoxetine (PAXIL) 20 MG tablet Take 20 mg by mouth at bedtime. 05/22/17  Yes [provider]  pregabalin (LYRICA) 75 MG capsule Take 75 mg by mouth 2 (two) times daily.   Yes [provider]  ustekinumab (STELARA) 90 MG/ML SOSY injection Inject into the skin every 8 (eight) weeks. 11/28/16  Yes [provider]    Physical Exam: Vitals:   07/01/17 1514 07/01/17 1516 07/01/17 1517 07/01/17 1825  BP:  128/81  119/67  Pulse:  76  76  Resp:  18  18  SpO2:  97%  99%  Weight: 81.6 kg (180 lb)  81.6 kg (180 lb)   Height: 6' (1.829 m)  6' (1.829 m)     Constitutional: NAD, calm, comfortable Vitals:   07/01/17 1514 07/01/17 1516 07/01/17 1517 07/01/17 1825  BP:  128/81  119/67  Pulse:  76  76  Resp:  18  18  SpO2:  97%  99%  Weight: 81.6 kg (180 lb)  81.6 kg (180 lb)   Height: 6' (1.829 m)  6' (1.829 m)    Eyes: lids and conjunctivae normal ENMT: Mucous membranes are moist.  Neck: normal, supple Respiratory: clear to auscultation bilaterally. Normal respiratory effort. No accessory muscle use.  Cardiovascular: Regular rate and rhythm, no murmurs. No extremity edema. Abdomen: Mild tenderness to palpation, no guarding, rigidity, or rebound.  Bowel sounds are present.  Scars from previous surgery noted. Musculoskeletal:  No joint deformity upper and lower extremities.   Skin: no rashes, lesions, ulcers.  Psychiatric: Normal judgment and insight. Alert and oriented x 3. Normal mood.   Labs on Admission: I have personally reviewed following labs and imaging studies  CBC: Recent Labs  Lab 07/01/17 1535  WBC 9.8  NEUTROABS 7.7  HGB 8.4*  HCT 28.6*  MCV 69.2*  PLT 737*   Basic Metabolic Panel: Recent Labs  Lab 07/01/17 1535  NA 140  K 4.7  CL 109  CO2 22  GLUCOSE 98  BUN 31*  CREATININE 1.28*  CALCIUM 9.1   GFR: Estimated Creatinine Clearance: 74.9 mL/min (A) (by C-G formula based on SCr of 1.28 mg/dL (H)). Liver Function Tests: Recent Labs  Lab 07/01/17 1535  AST 21  ALT 30  ALKPHOS 35*  BILITOT 0.3  PROT 6.7  ALBUMIN  3.3*   Recent Labs  Lab 07/01/17 1613  LIPASE 48   No results for input(s): AMMONIA in the last 168 hours. Coagulation Profile: No results for input(s): INR, PROTIME in the last 168 hours. Cardiac Enzymes: No results for input(s): CKTOTAL, CKMB, CKMBINDEX, TROPONINI in the last 168 hours. BNP (last 3 results) No results for input(s): PROBNP in the last 8760 hours. HbA1C: No results for input(s): HGBA1C in the last 72 hours. CBG: No results for input(s): GLUCAP in the last 168 hours. Lipid Profile: No results for input(s): CHOL, HDL, LDLCALC, TRIG, CHOLHDL, LDLDIRECT in the last 72 hours. Thyroid Function Tests: No results for input(s): TSH, T4TOTAL, FREET4, T3FREE, THYROIDAB in the last 72 hours. Anemia Panel: No results  for input(s): VITAMINB12, FOLATE, FERRITIN, TIBC, IRON, RETICCTPCT in the last 72 hours. Urine analysis:    Component Value Date/Time   COLORURINE YELLOW 07/01/2017 1830   APPEARANCEUR CLEAR 07/01/2017 1830   APPEARANCEUR Clear 05/26/2014 2027   LABSPEC 1.032 (H) 07/01/2017 1830   LABSPEC 1.023 05/26/2014 2027   PHURINE 5.0 07/01/2017 1830   GLUCOSEU NEGATIVE 07/01/2017 1830   GLUCOSEU Negative 05/26/2014 2027   HGBUR NEGATIVE 07/01/2017 1830   BILIRUBINUR NEGATIVE 07/01/2017 1830   BILIRUBINUR Negative 05/26/2014 2027   KETONESUR NEGATIVE 07/01/2017 1830   PROTEINUR NEGATIVE 07/01/2017 1830   NITRITE NEGATIVE 07/01/2017 1830   LEUKOCYTESUR NEGATIVE 07/01/2017 1830   LEUKOCYTESUR Negative 05/26/2014 2027    Radiological Exams on Admission: Ct Abdomen Pelvis W Contrast  Result Date: 07/01/2017 CLINICAL DATA:  Bloody stool history of Crohn's EXAM: CT ABDOMEN AND PELVIS WITH CONTRAST TECHNIQUE: Multidetector CT imaging of the abdomen and pelvis was performed using the standard protocol following bolus administration of intravenous contrast. CONTRAST:  131mL ISOVUE-300 IOPAMIDOL (ISOVUE-300) INJECTION 61% COMPARISON:  07/30/2014, 05/26/2014, 05/06/2014,  02/20/2014, 06/22/2017 FINDINGS: Lower chest: Patchy dependent atelectasis at the lung bases. No consolidation pleural effusion. Heart size within normal limits. Hepatobiliary: No focal hepatic abnormality. Similar linear calcification or density along the diaphragmatic surface of the liver. Contracted gallbladder with possible mild wall thickening. No biliary dilatation. Pancreas: Unremarkable. No pancreatic ductal dilatation or surrounding inflammatory changes. Spleen: Normal in size without focal abnormality. Adrenals/Urinary Tract: Adrenal glands are within normal limits. Kidneys show no hydronephrosis. Cyst in the midpole of the right kidney. The bladder is unremarkable Stomach/Bowel: The stomach is nonenlarged. Status post right hemicolectomy changes with small bowel colon anastomosis in the anterior midline abdomen. Slightly dilated segment of small bowel up to the anastomosis but similar compared to prior and no bowel wall thickening in the region. Possible mild small bowel wall thickening in the left lower quadrant of the abdomen but lack of enteral contrast precludes further evaluation. Remainder of the colon shows no inflammatory changes. Vascular/Lymphatic: Nonaneurysmal aorta. Nonspecific subcentimeter periaortic lymph nodes. Reproductive: Prostate gland calcifications. Slightly enlarged prostate. Other: Negative for free air or free fluid. Bilateral fat filled inguinal hernias. No free air or free fluid. Surgical changes along the ventral abdominal wall. Musculoskeletal: Trace retrolisthesis of L5 on S1. No acute or suspicious lesion. Stable possible hemangioma in L2. IMPRESSION: 1. Possible mild bowel wall thickening/area of active disease within left lower quadrant small bowel loops but without much surrounding inflammation, further evaluation limited without enteral contrast. 2. Otherwise no definite CT evidence for acute intra-abdominal or pelvic abnormality. Postsurgical changes compatible with  prior right hemicolectomy and small bowel colon anastomosis within the anterior abdomen near the midline. 3. Contracted gallbladder with possible mild wall thickening. Correlation with ultrasound if symptomatology suggests gallbladder disease. Electronically Signed   By: Donavan Foil M.D.   On: 07/01/2017 18:24    Assessment/Plan Principal Problem:   Crohn's colitis ([place]) Active Problems:   Multiple sclerosis ([facility])   Dehydration   Anemia    1. Acute Crohn's colitis flare.  Initiate IV steroids with methylprednisolone 20 mg every 8 hours as his oral intake is currently unpredictable.  We will also initiate Zosyn for empiric coverage.  Check CRP level and stool GI panel.  Consultation to GI for further evaluation in a.m.  Maintain on IV fluid for dehydration.  Clear liquid diet for now which can be advanced as tolerated. 2. Anemia-chronic with component of acute blood loss. Positive FOBT in  ED.  Check hemoglobin and hematocrit every 6 hours and transfuse for hemoglobin less than 7.  SCDs for DVT prophylaxis. 3. Diplopia secondary to multiple sclerosis.  The patient has relapsing multiple sclerosis and does have these intermittent episodes on occasion.  Continue Lyrica.  Consider neurology evaluation should this condition worsen. 4. GERD.  Maintain on IV Protonix daily for now. 5. Depression.  Maintain on Paxil.   DVT prophylaxis: SCDs Code Status: DNR Family Communication: None at bedside Disposition Plan:Tx for Crohns with GI eval Consults called:GI in computer Admission status: Obs, med-surg   Altie Savard D [facility] Pager [phone]  If 7PM-7AM, please contact night-coverage [email or web address] Password [email or web address]  07/01/2017, 8:25 PM

## 2017-07-01 NOTE — ED Provider Notes (Addendum)
[facility] EMERGENCY DEPARTMENT Provider Note   CSN: [id number] Arrival date & time: 07/01/17  1513     History   Chief Complaint Chief Complaint  Patient presents with  . GI Problem    HPI Walter Diaz is a 52 y.o. male.  Patient with a known history of Crohn's disease and multiple sclerosis presents with left lower quadrant abdominal pain and double vision.  He has been unable to drink fluids and feels dehydrated.  He also has had blood in his stool.  Severity of symptoms is moderate.  Nothing makes symptoms better or worse.  He was seen in the emergency department with similar symptoms on 06/22/17 and discharged home.      Past Medical History:  Diagnosis Date  . Crohn disease ([place])   . Multiple sclerosis ([place])     There are no active problems to display for this patient.   Past Surgical History:  Procedure Laterality Date  . APPENDECTOMY    . HERNIA REPAIR    . SMALL BOWEL REPAIR         Home Medications    Prior to Admission medications   Medication Sig Start Date End Date Taking? Authorizing Provider  amphetamine-dextroamphetamine (ADDERALL) 10 MG tablet Take 10 mg by mouth 2 (two) times daily.   Yes [provider]  aspirin 500 MG tablet Take 2,000 mg by mouth every 6 (six) hours as needed for mild pain or moderate pain.   Yes [provider]  cyanocobalamin (,VITAMIN B-12,) 1000 MCG/ML injection Inject into the muscle every 30 (thirty) days. 05/25/14  Yes [provider]  Multiple Vitamin (MULTIVITAMIN WITH MINERALS) TABS tablet Take 1 tablet by mouth daily.   Yes [provider]  ondansetron (ZOFRAN-ODT) 4 MG disintegrating tablet DISSOLVE 1 tablet ON THE TONGUE every 8 hours as needed for Nausea/Vomiting. 05/07/17  Yes [provider]  pantoprazole (PROTONIX) 40 MG tablet Take 40 mg by mouth daily. 03/21/17  Yes [provider]  PARoxetine (PAXIL) 20 MG tablet Take 20 mg by mouth at bedtime. 05/22/17   Yes [provider]  pregabalin (LYRICA) 75 MG capsule Take 75 mg by mouth 2 (two) times daily.   Yes [provider]  ustekinumab (STELARA) 90 MG/ML SOSY injection Inject into the skin every 8 (eight) weeks. 11/28/16  Yes [provider]    Family History Family History  Problem Relation Age of Onset  . Cancer Father     Social History Social History   Tobacco Use  . Smoking status: Current Every Day Smoker  . Smokeless tobacco: Never Used  Substance Use Topics  . Alcohol use: No  . Drug use: No     Allergies   Hctz [hydrochlorothiazide] and Morphine and related   Review of Systems Review of Systems  All other systems reviewed and are negative.    Physical Exam Updated Vital Signs BP 119/67   Pulse 76   Resp 18   Ht 6' (1.829 m)   Wt 81.6 kg (180 lb)   SpO2 99%   BMI 24.41 kg/m   Physical Exam  Constitutional: He is oriented to person, place, and time.  Dehydrated, alert  HENT:  Head: Normocephalic and atraumatic.  Eyes: Conjunctivae are normal.  Neck: Neck supple.  Cardiovascular: Normal rate and regular rhythm.  Pulmonary/Chest: Effort normal and breath sounds normal.  Abdominal: Bowel sounds are normal.  Tender left lower quadrant.  Musculoskeletal: Normal range of motion.  Neurological: He is alert and  oriented to person, place, and time.  Skin: Skin is warm and dry.  Psychiatric: He has a normal mood and affect. His behavior is normal.  Nursing note and vitals reviewed.    ED Treatments / Results  Labs (all labs ordered are listed, but only abnormal results are displayed) Labs Reviewed  CBC WITH DIFFERENTIAL/PLATELET - Abnormal; Notable for the following components:      Result Value   RBC 4.13 (*)    Hemoglobin 8.4 (*)    HCT 28.6 (*)    MCV 69.2 (*)    MCH 20.3 (*)    MCHC 29.4 (*)    RDW 17.7 (*)    Platelets 479 (*)    All other components within normal limits  COMPREHENSIVE METABOLIC PANEL - Abnormal;  Notable for the following components:   BUN 31 (*)    Creatinine, Ser 1.28 (*)    Albumin 3.3 (*)    Alkaline Phosphatase 35 (*)    All other components within normal limits  URINALYSIS, ROUTINE W REFLEX MICROSCOPIC - Abnormal; Notable for the following components:   Specific Gravity, Urine 1.032 (*)    All other components within normal limits  POC OCCULT BLOOD, ED - Abnormal; Notable for the following components:   Fecal Occult Bld POSITIVE (*)    All other components within normal limits  C DIFFICILE QUICK SCREEN W PCR REFLEX  LIPASE, BLOOD    EKG  EKG Interpretation None       Radiology Ct Abdomen Pelvis W Contrast  Result Date: 07/01/2017 CLINICAL DATA:  Bloody stool history of Crohn's EXAM: CT ABDOMEN AND PELVIS WITH CONTRAST TECHNIQUE: Multidetector CT imaging of the abdomen and pelvis was performed using the standard protocol following bolus administration of intravenous contrast. CONTRAST:  146mL ISOVUE-300 IOPAMIDOL (ISOVUE-300) INJECTION 61% COMPARISON:  07/30/2014, 05/26/2014, 05/06/2014, 02/20/2014, 06/22/2017 FINDINGS: Lower chest: Patchy dependent atelectasis at the lung bases. No consolidation pleural effusion. Heart size within normal limits. Hepatobiliary: No focal hepatic abnormality. Similar linear calcification or density along the diaphragmatic surface of the liver. Contracted gallbladder with possible mild wall thickening. No biliary dilatation. Pancreas: Unremarkable. No pancreatic ductal dilatation or surrounding inflammatory changes. Spleen: Normal in size without focal abnormality. Adrenals/Urinary Tract: Adrenal glands are within normal limits. Kidneys show no hydronephrosis. Cyst in the midpole of the right kidney. The bladder is unremarkable Stomach/Bowel: The stomach is nonenlarged. Status post right hemicolectomy changes with small bowel colon anastomosis in the anterior midline abdomen. Slightly dilated segment of small bowel up to the anastomosis but  similar compared to prior and no bowel wall thickening in the region. Possible mild small bowel wall thickening in the left lower quadrant of the abdomen but lack of enteral contrast precludes further evaluation. Remainder of the colon shows no inflammatory changes. Vascular/Lymphatic: Nonaneurysmal aorta. Nonspecific subcentimeter periaortic lymph nodes. Reproductive: Prostate gland calcifications. Slightly enlarged prostate. Other: Negative for free air or free fluid. Bilateral fat filled inguinal hernias. No free air or free fluid. Surgical changes along the ventral abdominal wall. Musculoskeletal: Trace retrolisthesis of L5 on S1. No acute or suspicious lesion. Stable possible hemangioma in L2. IMPRESSION: 1. Possible mild bowel wall thickening/area of active disease within left lower quadrant small bowel loops but without much surrounding inflammation, further evaluation limited without enteral contrast. 2. Otherwise no definite CT evidence for acute intra-abdominal or pelvic abnormality. Postsurgical changes compatible with prior right hemicolectomy and small bowel colon anastomosis within the anterior abdomen near the midline. 3. Contracted gallbladder with possible  mild wall thickening. Correlation with ultrasound if symptomatology suggests gallbladder disease. Electronically Signed   By: Donavan Foil M.D.   On: 07/01/2017 18:24    Procedures Procedures (including critical care time)  Medications Ordered in ED Medications  morphine 4 MG/ML injection 4 mg (4 mg Intravenous Not Given 07/01/17 1815)  ondansetron (ZOFRAN) injection 4 mg (4 mg Intravenous Given 07/01/17 1636)  sodium chloride 0.9 % bolus 1,000 mL (0 mLs Intravenous Stopped 07/01/17 1841)  fentaNYL (SUBLIMAZE) injection 50 mcg (50 mcg Intravenous Given 07/01/17 1636)  sodium chloride 0.9 % bolus 1,000 mL (0 mLs Intravenous Stopped 07/01/17 1841)  iopamidol (ISOVUE-300) 61 % injection 100 mL (100 mLs Intravenous Contrast Given 07/01/17 1738)    HYDROmorphone (DILAUDID) injection 1 mg (1 mg Intravenous Given 07/01/17 1845)     Initial Impression / Assessment and Plan / ED Course  I have reviewed the triage vital signs and the nursing notes.  Pertinent labs & imaging results that were available during my care of the patient were reviewed by me and considered in my medical decision making (see chart for details).     Patient presents with left lower quadrant pain and diplopia.  He thinks his diplopia is secondary to multiple sclerosis.  CT scan shows probable inflammation of the small bowel in the left lower quadrant.  Will hydrate, treat pain, admit to general medicine.  Final Clinical Impressions(s) / ED Diagnoses   Final diagnoses:  Crohn's disease of colon with complication ([place])  Diplopia  Anemia, unspecified type    ED Discharge Orders    None       Nat Christen, MD 07/01/17 [place]    Nat Christen, MD 07/01/17 2000

## 2017-07-02 ENCOUNTER — Observation Stay (HOSPITAL_COMMUNITY): Payer: Medicare Other

## 2017-07-02 ENCOUNTER — Encounter (HOSPITAL_COMMUNITY): Payer: Self-pay | Admitting: Gastroenterology

## 2017-07-02 DIAGNOSIS — Z23 Encounter for immunization: Secondary | ICD-10-CM | POA: Diagnosis not present

## 2017-07-02 DIAGNOSIS — K50911 Crohn's disease, unspecified, with rectal bleeding: Secondary | ICD-10-CM

## 2017-07-02 DIAGNOSIS — K50111 Crohn's disease of large intestine with rectal bleeding: Secondary | ICD-10-CM | POA: Diagnosis not present

## 2017-07-02 DIAGNOSIS — F17200 Nicotine dependence, unspecified, uncomplicated: Secondary | ICD-10-CM | POA: Diagnosis present

## 2017-07-02 DIAGNOSIS — F329 Major depressive disorder, single episode, unspecified: Secondary | ICD-10-CM | POA: Diagnosis present

## 2017-07-02 DIAGNOSIS — Z7982 Long term (current) use of aspirin: Secondary | ICD-10-CM | POA: Diagnosis not present

## 2017-07-02 DIAGNOSIS — K219 Gastro-esophageal reflux disease without esophagitis: Secondary | ICD-10-CM | POA: Diagnosis present

## 2017-07-02 DIAGNOSIS — D500 Iron deficiency anemia secondary to blood loss (chronic): Secondary | ICD-10-CM | POA: Diagnosis present

## 2017-07-02 DIAGNOSIS — K50918 Crohn's disease, unspecified, with other complication: Secondary | ICD-10-CM | POA: Diagnosis not present

## 2017-07-02 DIAGNOSIS — E860 Dehydration: Secondary | ICD-10-CM | POA: Diagnosis present

## 2017-07-02 DIAGNOSIS — Z79899 Other long term (current) drug therapy: Secondary | ICD-10-CM | POA: Diagnosis not present

## 2017-07-02 DIAGNOSIS — K5000 Crohn's disease of small intestine without complications: Secondary | ICD-10-CM | POA: Diagnosis present

## 2017-07-02 DIAGNOSIS — K50119 Crohn's disease of large intestine with unspecified complications: Secondary | ICD-10-CM

## 2017-07-02 DIAGNOSIS — Z888 Allergy status to other drugs, medicaments and biological substances status: Secondary | ICD-10-CM | POA: Diagnosis not present

## 2017-07-02 DIAGNOSIS — K909 Intestinal malabsorption, unspecified: Secondary | ICD-10-CM | POA: Diagnosis present

## 2017-07-02 DIAGNOSIS — H532 Diplopia: Secondary | ICD-10-CM

## 2017-07-02 DIAGNOSIS — Z885 Allergy status to narcotic agent status: Secondary | ICD-10-CM | POA: Diagnosis not present

## 2017-07-02 DIAGNOSIS — D649 Anemia, unspecified: Secondary | ICD-10-CM | POA: Diagnosis not present

## 2017-07-02 DIAGNOSIS — G35 Multiple sclerosis: Secondary | ICD-10-CM

## 2017-07-02 DIAGNOSIS — Z66 Do not resuscitate: Secondary | ICD-10-CM | POA: Diagnosis present

## 2017-07-02 DIAGNOSIS — F419 Anxiety disorder, unspecified: Secondary | ICD-10-CM | POA: Diagnosis present

## 2017-07-02 DIAGNOSIS — K5090 Crohn's disease, unspecified, without complications: Secondary | ICD-10-CM | POA: Diagnosis present

## 2017-07-02 DIAGNOSIS — G894 Chronic pain syndrome: Secondary | ICD-10-CM | POA: Diagnosis present

## 2017-07-02 LAB — COMPREHENSIVE METABOLIC PANEL
ALK PHOS: 34 U/L — AB (ref 38–126)
ALT: 27 U/L (ref 17–63)
AST: 18 U/L (ref 15–41)
Albumin: 3.2 g/dL — ABNORMAL LOW (ref 3.5–5.0)
Anion gap: 8 (ref 5–15)
BUN: 25 mg/dL — AB (ref 6–20)
CALCIUM: 8.7 mg/dL — AB (ref 8.9–10.3)
CHLORIDE: 106 mmol/L (ref 101–111)
CO2: 21 mmol/L — AB (ref 22–32)
CREATININE: 1.24 mg/dL (ref 0.61–1.24)
Glucose, Bld: 103 mg/dL — ABNORMAL HIGH (ref 65–99)
Potassium: 4.9 mmol/L (ref 3.5–5.1)
Sodium: 135 mmol/L (ref 135–145)
Total Bilirubin: 0.1 mg/dL — ABNORMAL LOW (ref 0.3–1.2)
Total Protein: 6.5 g/dL (ref 6.5–8.1)

## 2017-07-02 LAB — IRON AND TIBC
Iron: 17 ug/dL — ABNORMAL LOW (ref 45–182)
SATURATION RATIOS: 3 % — AB (ref 17.9–39.5)
TIBC: 519 ug/dL — AB (ref 250–450)
UIBC: 502 ug/dL

## 2017-07-02 LAB — C DIFFICILE QUICK SCREEN W PCR REFLEX
C DIFFICILE (CDIFF) INTERP: NOT DETECTED
C Diff antigen: NEGATIVE
C Diff toxin: NEGATIVE

## 2017-07-02 LAB — SEDIMENTATION RATE: SED RATE: 30 mm/h — AB (ref 0–16)

## 2017-07-02 LAB — GASTROINTESTINAL PANEL BY PCR, STOOL (REPLACES STOOL CULTURE)
ASTROVIRUS: NOT DETECTED
Adenovirus F40/41: NOT DETECTED
Campylobacter species: NOT DETECTED
Cryptosporidium: NOT DETECTED
Cyclospora cayetanensis: NOT DETECTED
ENTAMOEBA HISTOLYTICA: NOT DETECTED
ENTEROAGGREGATIVE E COLI (EAEC): NOT DETECTED
Enteropathogenic E coli (EPEC): NOT DETECTED
Enterotoxigenic E coli (ETEC): NOT DETECTED
GIARDIA LAMBLIA: NOT DETECTED
NOROVIRUS GI/GII: NOT DETECTED
Plesimonas shigelloides: NOT DETECTED
ROTAVIRUS A: NOT DETECTED
SHIGELLA/ENTEROINVASIVE E COLI (EIEC): NOT DETECTED
Salmonella species: NOT DETECTED
Sapovirus (I, II, IV, and V): NOT DETECTED
Shiga like toxin producing E coli (STEC): NOT DETECTED
VIBRIO CHOLERAE: NOT DETECTED
Vibrio species: NOT DETECTED
Yersinia enterocolitica: NOT DETECTED

## 2017-07-02 LAB — HEMOGLOBIN
Hemoglobin: 8.9 g/dL — ABNORMAL LOW (ref 13.0–17.0)
Hemoglobin: 8.6 g/dL — ABNORMAL LOW (ref 13.0–17.0)

## 2017-07-02 LAB — HEMATOCRIT
HCT: 30.6 % — ABNORMAL LOW (ref 39.0–52.0)
HCT: 29.3 % — ABNORMAL LOW (ref 39.0–52.0)

## 2017-07-02 LAB — CBC
HCT: 28.7 % — ABNORMAL LOW (ref 39.0–52.0)
Hemoglobin: 8.4 g/dL — ABNORMAL LOW (ref 13.0–17.0)
MCH: 20.3 pg — AB (ref 26.0–34.0)
MCHC: 29.3 g/dL — ABNORMAL LOW (ref 30.0–36.0)
MCV: 69.5 fL — AB (ref 78.0–100.0)
PLATELETS: 460 10*3/uL — AB (ref 150–400)
RBC: 4.13 MIL/uL — AB (ref 4.22–5.81)
RDW: 17.7 % — AB (ref 11.5–15.5)
WBC: 6.6 10*3/uL (ref 4.0–10.5)

## 2017-07-02 LAB — FERRITIN: Ferritin: 7 ng/mL — ABNORMAL LOW (ref 24–336)

## 2017-07-02 LAB — C-REACTIVE PROTEIN

## 2017-07-02 LAB — GLUCOSE, CAPILLARY: Glucose-Capillary: 109 mg/dL — ABNORMAL HIGH (ref 65–99)

## 2017-07-02 MED ORDER — SODIUM CHLORIDE 0.9 % IV SOLN
INTRAVENOUS | Status: AC
Start: 2017-07-02 — End: 2017-07-03
  Administered 2017-07-02 – 2017-07-03 (×2): via INTRAVENOUS

## 2017-07-02 MED ORDER — LORAZEPAM 2 MG/ML IJ SOLN
1.0000 mg | Freq: Once | INTRAMUSCULAR | Status: AC
  Administered 2017-07-02: 1 mg via INTRAVENOUS
  Filled 2017-07-02: qty 1

## 2017-07-02 MED ORDER — GADOBENATE DIMEGLUMINE 529 MG/ML IV SOLN
15.0000 mL | Freq: Once | INTRAVENOUS | Status: AC | PRN
  Administered 2017-07-02: 17 mL via INTRAVENOUS

## 2017-07-02 NOTE — Plan of Care (Signed)
Patient has had an uneventful night. He has remained alert and oriented x4. His vitals remain stable. He tolerated all medications well with no adverse reactions. All safety measures remain in place. No signs and symptoms of distress noted. Will continue to monitor patient.

## 2017-07-02 NOTE — Progress Notes (Signed)
Pt resting, coloring and listening to music. States pain is better rating 4 to abd.

## 2017-07-02 NOTE — Clinical Social Work Note (Signed)
Received CSW consult stating pt with transportation needs at dc. In progression today, MD stated pt not yet stable for dc. CSW will follow and assist if needed when pt medically stable for dc.

## 2017-07-02 NOTE — Progress Notes (Signed)
PROGRESS NOTE  Walter Diaz FGH:[id number] DOB: 03-17-1966 DOA: 07/01/2017 PCP: Patient, No Pcp Per  Brief History:  52 year old male with a history of multiple sclerosis, Crohn's colitis status post right hemicolectomy with small bowel anastomosis, depression, GERD presenting with 10-day history of abdominal pain and increasing hematochezia.  The patient  has recently moved from [place] to [place] to be with his family.  He last saw his gastroenterologist before Thanksgiving 2018.  He is currently on Stelara for his Crohn's  enterocolitis.  He states that his last dose was 04/19/2017.  Because he has moved to [facility], he has been lost to follow-up with his GI physician.  He presented to emergency department on 06/22/2017 with abdominal pain.  He was discharged with Percocet and Zofran in stable condition.  He has not established with any physicians in [place].  Because of persistent pain and increasing loose stools and hematochezia, he presented to the emergency department.  He normally has 5-6 bowel movements daily, but in the past week he has had increasing loose stools, 10-15/day.  He has had some subjective fevers and chills.  He had one episode of emesis on 07/01/2017.  He denies any headache, chest pain, shortness of breath, hemoptysis.  Notably, the patient states that he has been taking up to 12 aspirin at least 4 days out of each week.  Regarding his multiple sclerosis, the patient complains of diplopia times 3 days.  He was diagnosed with multiple sclerosis back in April 2012.  He has not followed up with a neurologist since May 2018.  He was previously taking Copaxone for his multiple sclerosis, but has not been on any immunomodulators.  He denies any new extremity weakness, headaches, but he complains of chronic radicular type pain in his left  upper extremity which has not changed.    Assessment/Plan: Hematochezia/abdominal pain/diarrhea--?Crohn's Colitis  Exacerbation -07/02/2017 CT abdomen--status post right hemicolectomy with small bowel anastomosis.  Possible small bowel wall thickening at LLQ without significant inflammatory changes -GI consult -Continue IV Solu-Medrol pending GI eval -Discontinue Zosyn -C. difficile negative -follow-up GI pathogen panel -Check CRP and ESR  Diplopia -Concerned about multiple sclerosis flare -Neurology consult -MRI brain with and without gadolinium  Microcytic anemia -Secondary to chronic blood loss -Baseline hemoglobin~9 upon review of medical records -check iron/tibc, ferritin  Depression/anxiety -Continue Paxil -Continue home dose of Adderall  Chronic pain syndrome -Continue Lyrica -Judicious opioids dehydration  Dehydration -Continue IV fluids    Disposition Plan:   Home when cleared by GI/Neurology Family Communication:  No Family at bedside  Consultants:  GI, neurology  Code Status:  FULL  DVT Prophylaxis:  SCDs   Procedures: As Listed in Progress Note Above  Antibiotics: Zosyn 3/3>>>3/4    Subjective: Patient continues to have loose stool and hematochezia.  He states that his abdominal pain has not worsened, but is not better.  He denies any fevers, chills, headache, chest pain, shortness breath, hemoptysis.  Continues to have diplopia without any focal extremity weakness.  He states that his abdominal pain is in the left lower quadrant moderate in nature and sharp.  Objective: Vitals:   07/01/17 2030 07/01/17 2105 07/01/17 2150 07/02/17 0500  BP: (!) 131/99 124/86 (!) 153/83 (!) 128/91  Pulse: 88 97 81 83  Resp:  18 18 18   Temp:   98.8 F (37.1 C) 98.3 F (36.8 C)  TempSrc:   Oral Oral  SpO2: 95% 96% 97%  98%  Weight:      Height:   6' (1.829 m)     Intake/Output Summary (Last 24 hours) at 07/02/2017 0753 Last data filed at 07/02/2017 0003 Gross per 24 hour  Intake 2240 ml  Output -  Net 2240 ml   Weight change:  Exam:   General:  Pt is alert, follows  commands appropriately, not in acute distress  HEENT: No icterus, No thrush, No neck mass, [place]/AT  Cardiovascular: RRR, S1/S2, no rubs, no gallops  Respiratory: CTA bilaterally, no wheezing, no crackles, no rhonchi  Abdomen: Soft/+BS, nLLQ tender, non distended, no guarding  Extremities: No edema, No lymphangitis, No petechiae, No rashes, no synovitis   Data Reviewed: I have personally reviewed following labs and imaging studies Basic Metabolic Panel: Recent Labs  Lab 07/01/17 1535 07/02/17 0328  NA 140 135  K 4.7 4.9  CL 109 106  CO2 22 21*  GLUCOSE 98 103*  BUN 31* 25*  CREATININE 1.28* 1.24  CALCIUM 9.1 8.7*   Liver Function Tests: Recent Labs  Lab 07/01/17 1535 07/02/17 0328  AST 21 18  ALT 30 27  ALKPHOS 35* 34*  BILITOT 0.3 0.1*  PROT 6.7 6.5  ALBUMIN 3.3* 3.2*   Recent Labs  Lab 07/01/17 1613  LIPASE 48   No results for input(s): AMMONIA in the last 168 hours. Coagulation Profile: No results for input(s): INR, PROTIME in the last 168 hours. CBC: Recent Labs  Lab 07/01/17 1535 07/01/17 2045 07/02/17 0328  WBC 9.8  --  6.6  NEUTROABS 7.7  --   --   HGB 8.4* 8.0* 8.4*  HCT 28.6* 27.6* 28.7*  MCV 69.2*  --  69.5*  PLT 479*  --  460*   Cardiac Enzymes: No results for input(s): CKTOTAL, CKMB, CKMBINDEX, TROPONINI in the last 168 hours. BNP: Invalid input(s): POCBNP CBG: Recent Labs  Lab 07/02/17 0748  GLUCAP 109*   HbA1C: No results for input(s): HGBA1C in the last 72 hours. Urine analysis:    Component Value Date/Time   COLORURINE YELLOW 07/01/2017 1830   APPEARANCEUR CLEAR 07/01/2017 1830   APPEARANCEUR Clear 05/26/2014 2027   LABSPEC 1.032 (H) 07/01/2017 1830   LABSPEC 1.023 05/26/2014 2027   PHURINE 5.0 07/01/2017 1830   GLUCOSEU NEGATIVE 07/01/2017 1830   GLUCOSEU Negative 05/26/2014 2027   HGBUR NEGATIVE 07/01/2017 1830   BILIRUBINUR NEGATIVE 07/01/2017 1830   BILIRUBINUR Negative 05/26/2014 2027   KETONESUR NEGATIVE  07/01/2017 1830   PROTEINUR NEGATIVE 07/01/2017 1830   NITRITE NEGATIVE 07/01/2017 1830   LEUKOCYTESUR NEGATIVE 07/01/2017 1830   LEUKOCYTESUR Negative 05/26/2014 2027   Sepsis Labs: @LABRCNTIP (procalcitonin:4,lacticidven:4) ) Recent Results (from the past 240 hour(s))  C difficile quick scan w PCR reflex     Status: None   Collection Time: 07/02/17  2:50 AM  Result Value Ref Range Status   C Diff antigen NEGATIVE NEGATIVE Final   C Diff toxin NEGATIVE NEGATIVE Final   C Diff interpretation No C. difficile detected.  Final    Comment: VALID Performed at [facility], [address], [place], [place] [address]      Scheduled Meds: . amphetamine-dextroamphetamine  10 mg Oral BID  . Influenza vac split quadrivalent PF  0.5 mL Intramuscular Tomorrow-1000  . methylPREDNISolone (SOLU-MEDROL) injection  20 mg Intravenous Q8H  .  morphine injection  4 mg Intravenous Once  . pantoprazole (PROTONIX) IV  40 mg Intravenous Q24H  . PARoxetine  20 mg Oral QHS  . pregabalin  75  mg Oral BID   Continuous Infusions: . lactated ringers 100 mL/hr at 07/01/17 2209  . piperacillin-tazobactam (ZOSYN)  IV 3.375 g (07/02/17 0003)    Procedures/Studies: Dg Chest 2 View  Result Date: 06/22/2017 CLINICAL DATA:  Productive cough x1 week. EXAM: CHEST  2 VIEW COMPARISON:  05/06/2014 FINDINGS: The heart size and mediastinal contours are within normal limits. Port catheter tip is noted in the mid SVC. Both lungs are clear. The visualized skeletal structures are unremarkable. IMPRESSION: No active cardiopulmonary disease. Electronically Signed   By: Ashley Royalty M.D.   On: 06/22/2017 20:24   Ct Abdomen Pelvis W Contrast  Result Date: 07/01/2017 CLINICAL DATA:  Bloody stool history of Crohn's EXAM: CT ABDOMEN AND PELVIS WITH CONTRAST TECHNIQUE: Multidetector CT imaging of the abdomen and pelvis was performed using the standard protocol following bolus administration of intravenous contrast. CONTRAST:  168m  ISOVUE-300 IOPAMIDOL (ISOVUE-300) INJECTION 61% COMPARISON:  07/30/2014, 05/26/2014, 05/06/2014, 02/20/2014, 06/22/2017 FINDINGS: Lower chest: Patchy dependent atelectasis at the lung bases. No consolidation pleural effusion. Heart size within normal limits. Hepatobiliary: No focal hepatic abnormality. Similar linear calcification or density along the diaphragmatic surface of the liver. Contracted gallbladder with possible mild wall thickening. No biliary dilatation. Pancreas: Unremarkable. No pancreatic ductal dilatation or surrounding inflammatory changes. Spleen: Normal in size without focal abnormality. Adrenals/Urinary Tract: Adrenal glands are within normal limits. Kidneys show no hydronephrosis. Cyst in the midpole of the right kidney. The bladder is unremarkable Stomach/Bowel: The stomach is nonenlarged. Status post right hemicolectomy changes with small bowel colon anastomosis in the anterior midline abdomen. Slightly dilated segment of small bowel up to the anastomosis but similar compared to prior and no bowel wall thickening in the region. Possible mild small bowel wall thickening in the left lower quadrant of the abdomen but lack of enteral contrast precludes further evaluation. Remainder of the colon shows no inflammatory changes. Vascular/Lymphatic: Nonaneurysmal aorta. Nonspecific subcentimeter periaortic lymph nodes. Reproductive: Prostate gland calcifications. Slightly enlarged prostate. Other: Negative for free air or free fluid. Bilateral fat filled inguinal hernias. No free air or free fluid. Surgical changes along the ventral abdominal wall. Musculoskeletal: Trace retrolisthesis of L5 on S1. No acute or suspicious lesion. Stable possible hemangioma in L2. IMPRESSION: 1. Possible mild bowel wall thickening/area of active disease within left lower quadrant small bowel loops but without much surrounding inflammation, further evaluation limited without enteral contrast. 2. Otherwise no definite CT  evidence for acute intra-abdominal or pelvic abnormality. Postsurgical changes compatible with prior right hemicolectomy and small bowel colon anastomosis within the anterior abdomen near the midline. 3. Contracted gallbladder with possible mild wall thickening. Correlation with ultrasound if symptomatology suggests gallbladder disease. Electronically Signed   By: KDonavan FoilM.D.   On: 07/01/2017 18:24   Ct Abdomen Pelvis W Contrast  Result Date: 06/22/2017 CLINICAL DATA:  History of Crohn's, unable to urinate for 3 days history of MS EXAM: CT ABDOMEN AND PELVIS WITH CONTRAST TECHNIQUE: Multidetector CT imaging of the abdomen and pelvis was performed using the standard protocol following bolus administration of intravenous contrast. CONTRAST:  1057mISOVUE-300 IOPAMIDOL (ISOVUE-300) INJECTION 61% COMPARISON:  CT 09/15/2014, 07/30/2014, 05/26/2014, 05/06/2014, 02/20/2014 FINDINGS: Lower chest: Lung bases demonstrate no acute consolidation or effusion. Normal heart size. Hepatobiliary: No focal liver abnormality is seen. No gallstones, gallbladder wall thickening, or biliary dilatation. Pancreas: Unremarkable. No pancreatic ductal dilatation or surrounding inflammatory changes. Spleen: Normal in size without focal abnormality. Adrenals/Urinary Tract: Adrenal glands are within normal limits. Multiple cysts in the  right kidney. Subcentimeter hypodensity mid left kidney too small to further characterize. No hydronephrosis. The bladder is normal Stomach/Bowel: Right hemicolectomy changes with small bowel colon anastomosis in the upper abdomen, at the midline. Diffuse fluid within the residual colon. Focal area of narrowing within the rectosigmoid colon, series 2, image number 66. There is mild dilatation of the small bowel upstream of the anastomosis measuring up to 4.8 cm. No definite small bowel thickening. Remainder of the small bowel is collapsed. The stomach is within normal limits. Vascular/Lymphatic:  Nonaneurysmal aorta. No significantly enlarged lymph nodes. Reproductive: Slightly enlarged prostate with coarse calcification Other: No free air or free fluid. Fat in the inguinal canals bilaterally. Musculoskeletal: Stable lucent lesion in L2. No acute or suspicious lesion. Mild retrolisthesis of L4 on L5. IMPRESSION: 1. Kidneys show no hydronephrosis or acute abnormality. 2. Status post right hemi colectomy with small bowel colon anastomosis noted within the anterior abdomen at the midline. The segment of small bowel immediately upstream to the anastomosis is slightly dilated and fluid-filled. The remainder of the small bowel is unremarkable. There is a focal area of narrowing and soft tissue thickening in the rectosigmoid colon which may be secondary to spasm, stricture, or focal area of acute disease although not much surrounding inflammation. Electronically Signed   By: Donavan Foil M.D.   On: 06/22/2017 21:42    Orson Eva, DO  [facility] Pager [phone]  If 7PM-7AM, please contact night-coverage [email or web address] Password [email or web address] 07/02/2017, 7:53 AM   LOS: 0 days

## 2017-07-02 NOTE — Consult Note (Signed)
Referring Provider: Dr. Manuella Ghazi  Primary Care Physician:  Patient, No Pcp Per Primary Gastroenterologist:  Previously seen at the [facility]   Date of Admission: 07/01/17 Date of Consultation: 07/02/17  Reason for Consultation:  Crohn's Flare   HPI:  Walter Diaz is a 52 y.o. year old male with a history of Crohn's diagnosed in 1988, s/p multiple small bowel resections [facility]) and exploratory laparotomy with lysis of adhesions and ileostomy takedown with ileocolonic resection and primary anastomosis in Aug 2017. In remote past had fistulas, last in 2002. 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 Had port placed for Entyvio and blood draws in 2018. States everything works great for 18 months and then starts going downhill. Last admission to hospital in [place] was Nov 22-Mar 26, 2017 for melena, anemia. Has required IV iron infusion and blood transfusions for chronic anemia. He has had improvement historically with prednisone tapers, but compliance has remained an issue.    History of chronic abdominal pain. Has been followed by local pain management in [place]. First Stelara infusion Apr 06, 2017, first dose. Initial induction dose of 520 mg IV infusion, with recommendations for  90 mg subcutaneous every 8 weeks, beginning 8 weeks after IV induction dose.  He was to see GI (Dr. Crisoforo Oxford) in [place] but left the office before being seen. He onlny had the induction dose and nothing since that time.   CT in [place] Nov 2018: wall thickening along 15 cm segment of distal small bowel adjancent to anastomosis, enlarged mesenteric lymph nodes likely reactive. Colonoscopy in [place] Nov 2018: moderately active Crohn's disease of distal ileum and ileocolonic anastomosis s/p biopsy, internal hemorrhoids. EGD  with 1 cm hiatal hernia, irregular Z-line s/p biopsy, mild erosive gastritis, s/p biopsy. Path not listed in care everywhere.   July 2018 colonoscopy: Crohn's disease with ileitis, healthy appearing ileo-colonic mucosa.   Feb 2018 colonoscopy: few erosions in TI, otherwise normal.   SEd rate Sept 2018: 39  Aug 2018: Hep B core antibody total negative, Hep C antibody negaitve, Hep B surface antigen negative, Hep B surface antibody equivocal   Last Hgb 9.3 in Dec 2018. Hgb range 8-10. Prior need for transfusion with Hgb in 6 range last fall.   Moved back here less than 30 days ago. In 2015 states he was seen at [facility] for Crohn's disease. Presented to ED with abdominal pain, loose stool, rectal bleeding on the 2/22, then presented again 07/01/17 with up to 14 loose stools. Baseline is 5-6 loose stool. Associated abdominal pain. When having bowel movements, all his energy goes with it.   18 months ago was 287, now 180. Aspirin 500 mg multiple times a day. States he has no pain meds to offer relief.   Past Medical History:  Diagnosis Date  . Anemia   . Arthritis   . Asthma   . Cancer ([place])   . CHF (congestive heart failure) ([place])   . Chronic kidney disease   . Crohn disease ([place])   . Diabetes mellitus without complication ([place])   . Hypertension   . Multiple sclerosis ([place])   . RA (rheumatoid arthritis) ([place])   . Sleep apnea    Past Surgical History:  Procedure Laterality Date  . APPENDECTOMY    . COLONOSCOPY  03/2017   [place]: Moderately active Crohn's  disease of distal ileum and ileocolonic anastomosis s/p biopsy, internal hemorrhoids  . ESOPHAGOGASTRODUODENOSCOPY  03/2017   [facility]: 1 cm hiatal hernia, irregular Z-line s/p biopsy, mild erosive gastritis, s/p biopsy. Path unavailable  . EXPLORATORY LAPAROTOMY  11/2015   [facility]: with lysis of adhesions and ileostomy takedown with ileocolonic resection and primary anastomosis   . HERNIA REPAIR    . HERNIA REPAIR   2009   [facility]   . right knee surgery    . SMALL BOWEL REPAIR    . small bowel resections     multiple     Prior to Admission medications   Medication Sig Start Date End Date Taking? Authorizing Provider  amphetamine-dextroamphetamine (ADDERALL) 10 MG tablet Take 10 mg by mouth 2 (two) times daily.   Yes [provider]  aspirin 500 MG tablet Take 2,000 mg by mouth every 6 (six) hours as needed for mild pain or moderate pain.   Yes [provider]  cyanocobalamin (,VITAMIN B-12,) 1000 MCG/ML injection Inject into the muscle every 30 (thirty) days. 05/25/14  Yes [provider]  Multiple Vitamin (MULTIVITAMIN WITH MINERALS) TABS tablet Take 1 tablet by mouth daily.   Yes [provider]  ondansetron (ZOFRAN-ODT) 4 MG disintegrating tablet DISSOLVE 1 tablet ON THE TONGUE every 8 hours as needed for Nausea/Vomiting. 05/07/17  Yes [provider]  pantoprazole (PROTONIX) 40 MG tablet Take 40 mg by mouth daily. 03/21/17  Yes [provider]  PARoxetine (PAXIL) 20 MG tablet Take 20 mg by mouth at bedtime. 05/22/17  Yes [provider]  pregabalin (LYRICA) 75 MG capsule Take 75 mg by mouth 2 (two) times daily.   Yes [provider]  ustekinumab (STELARA) 90 MG/ML SOSY injection Inject into the skin every 8 (eight) weeks. 11/28/16  Yes [provider]    Current Facility-Administered Medications  Medication Dose Route Frequency Provider Last Rate Last Dose  . 0.9 %  sodium chloride infusion   Intravenous Continuous Tat, Shanon Brow, MD 75 mL/hr at 07/02/17 1346    . acetaminophen (TYLENOL) tablet 650 mg  650 mg Oral Q6H PRN Manuella Ghazi, Pratik D, DO       Or  . acetaminophen (TYLENOL) suppository 650 mg  650 mg Rectal Q6H PRN Manuella Ghazi, Pratik D, DO      . amphetamine-dextroamphetamine (ADDERALL) tablet 10 mg  10 mg Oral BID Manuella Ghazi, Pratik D, DO   10 mg at 07/02/17 0850  . HYDROmorphone (DILAUDID) injection 1 mg  1 mg Intravenous Q2H PRN Manuella Ghazi,  Pratik D, DO   1 mg at 07/02/17 1439  . methylPREDNISolone sodium succinate (SOLU-MEDROL) 40 mg/mL injection 20 mg  20 mg Intravenous Q8H Shah, Pratik D, DO   20 mg at 07/02/17 1548  . morphine 4 MG/ML injection 4 mg  4 mg Intravenous Once Nat Christen, MD      . ondansetron [facility]) tablet 4 mg  4 mg Oral Q6H PRN Manuella Ghazi, Pratik D, DO       Or  . ondansetron (ZOFRAN) injection 4 mg  4 mg Intravenous Q6H PRN Manuella Ghazi, Pratik D, DO      . pantoprazole (PROTONIX) injection 40 mg  40 mg Intravenous Q24H Shah, Pratik D, DO   40 mg at 07/02/17 0003  . PARoxetine (PAXIL) tablet 20 mg  20 mg Oral QHS Shah, Pratik D, DO   20 mg at 07/02/17 0002  . pregabalin (LYRICA) capsule 75 mg  75 mg Oral BID Manuella Ghazi, Pratik D, DO  75 mg at 07/02/17 0849    Allergies as of 07/01/2017 - Review Complete 07/01/2017  Allergen Reaction Noted  . Hctz [hydrochlorothiazide] Anaphylaxis 06/22/2017  . Morphine and related Itching 09/14/2014    Family History  Problem Relation Age of Onset  . Crohn's disease Mother   . Colon cancer Father        unknown    Social History   Socioeconomic History  . Marital status: Single    Spouse name: Not on file  . Number of children: Not on file  . Years of education: Not on file  . Highest education level: Not on file  Social Needs  . Financial resource strain: Not on file  . Food insecurity - worry: Not on file  . Food insecurity - inability: Not on file  . Transportation needs - medical: Not on file  . Transportation needs - non-medical: Not on file  Occupational History  . Not on file  Tobacco Use  . Smoking status: Former Smoker    Last attempt to quit: 06/30/2015    Years since quitting: 2.0  . Smokeless tobacco: Current User    Types: Chew  . Tobacco comment: 1 can every 2 months   Substance and Sexual Activity  . Alcohol use: No    Comment: reformed alcohol X 12 years   . Drug use: Yes    Types: Marijuana    Comment: in remote past, "everything"   . Sexual  activity: Yes  Other Topics Concern  . Not on file  Social History Narrative  . Not on file    Review of Systems: Gen: see HPI  CV: Denies chest pain, heart palpitations, syncope, edema  Resp: Denies shortness of breath with rest, cough, wheezing GI: see HPI  GU : Denies urinary burning, urinary frequency, urinary incontinence.  MS: see HPI  Derm: Denies rash, itching, dry skin Psych: +anxiety Heme: see HPI   Physical Exam: Vital signs in last 24 hours: Temp:  [98.1 F (36.7 C)-98.8 F (37.1 C)] 98.1 F (36.7 C) (03/04 1400) Pulse Rate:  [76-97] 88 (03/04 1400) Resp:  [18] 18 (03/04 1400) BP: (119-153)/(67-99) 119/86 (03/04 1400) SpO2:  [95 %-99 %] 98 % (03/04 1400) Last BM Date: 07/02/17 General:   Alert,  Well-developed, well-nourished, pleasant and cooperative in NAD Head:  Normocephalic and atraumatic. Eyes:  Sclera clear, no icterus.   Conjunctiva pink. Ears:  Normal auditory acuity. Nose:  No deformity, discharge,  or lesions. Lungs:  Clear throughout to auscultation.   No wheezes, crackles, or rhonchi. No acute distress. Heart:  S1 S2 present without murmurs  Abdomen:  Soft, TTP RLQ/LLQ and nondistended. No masses, hepatosplenomegaly or hernias noted. Normal bowel sounds, without guarding, and without rebound. Multiple well-healed surgical scars  Rectal:  Deferred  Msk:  Symmetrical without gross deformities. Normal posture. Extremities:  Without  edema. Neurologic:  Alert and  oriented x4 Psych:  Alert and cooperative. Normal mood and affect.  Intake/Output from previous day: 03/03 0701 - 03/04 0700 In: 2240 [P.O.:240; IV Piggyback:2000] Out: -  Intake/Output this shift: Total I/O In: 720 [P.O.:720] Out: 1350 [Urine:1350]  Lab Results: Recent Labs    07/01/17 1535  07/02/17 0328 07/02/17 0839 07/02/17 1456  WBC 9.8  --  6.6  --   --   HGB 8.4*   < > 8.4* 8.9* 8.6*  HCT 28.6*   < > 28.7* 30.6* 29.3*  PLT 479*  --  460*  --   --    < > =  values in  this interval not displayed.   BMET Recent Labs    07/01/17 1535 07/02/17 0328  NA 140 135  K 4.7 4.9  CL 109 106  CO2 22 21*  GLUCOSE 98 103*  BUN 31* 25*  CREATININE 1.28* 1.24  CALCIUM 9.1 8.7*   LFT Recent Labs    07/01/17 1535 07/02/17 0328  PROT 6.7 6.5  ALBUMIN 3.3* 3.2*  AST 21 18  ALT 30 27  ALKPHOS 35* 34*  BILITOT 0.3 0.1*   C-Diff Recent Labs    07/02/17 0250  CDIFFTOX NEGATIVE    Studies/Results: Mr Jeri Cos [place] Contrast  Result Date: 07/02/2017 CLINICAL DATA:  Multiple sclerosis EXAM: MRI HEAD WITHOUT AND WITH CONTRAST TECHNIQUE: Multiplanar, multiecho pulse sequences of the brain and surrounding structures were obtained without and with intravenous contrast. CONTRAST:  52mL MULTIHANCE GADOBENATE DIMEGLUMINE 529 MG/ML IV SOLN COMPARISON:  CT 05/06/2014, MRI 05/07/2013 FINDINGS: Brain: Image quality degraded by motion. The patient was claustrophobic and motion was progressive throughout the study. Ventricle size normal. Cerebral volume normal. Punctate hyperintensities in the subcortical white matter bilaterally best seen on the prior study which is of better quality. These are obscured by motion artifact today. No new white matter lesions. Brainstem and cerebellum and basal ganglia normal. Negative for acute infarct. Negative for hemorrhage or mass. Cerebellar tonsils 4 mm below the foramen magnum, this is the upper limits of normal. Normal enhancement following contrast infusion. Vascular: Normal arterial flow voids Skull and upper cervical spine: Negative Sinuses/Orbits: Extensive mucosal edema throughout the paranasal sinuses with progression from the prior study. Normal orbit. Mastoid sinus clear bilaterally. Other: None IMPRESSION: Image quality degraded by motion. This obscures the small white matter lesion seen previously. These are not typical for multiple sclerosis. No new lesions and no acute abnormality. Extensive mucosal edema paranasal sinuses with  progression from the prior study Cerebellar tonsils low lying but without definite Chiari malformation. Electronically Signed   By: Franchot Gallo M.D.   On: 07/02/2017 12:09   Ct Abdomen Pelvis W Contrast  Result Date: 07/01/2017 CLINICAL DATA:  Bloody stool history of Crohn's EXAM: CT ABDOMEN AND PELVIS WITH CONTRAST TECHNIQUE: Multidetector CT imaging of the abdomen and pelvis was performed using the standard protocol following bolus administration of intravenous contrast. CONTRAST:  16mL ISOVUE-300 IOPAMIDOL (ISOVUE-300) INJECTION 61% COMPARISON:  07/30/2014, 05/26/2014, 05/06/2014, 02/20/2014, 06/22/2017 FINDINGS: Lower chest: Patchy dependent atelectasis at the lung bases. No consolidation pleural effusion. Heart size within normal limits. Hepatobiliary: No focal hepatic abnormality. Similar linear calcification or density along the diaphragmatic surface of the liver. Contracted gallbladder with possible mild wall thickening. No biliary dilatation. Pancreas: Unremarkable. No pancreatic ductal dilatation or surrounding inflammatory changes. Spleen: Normal in size without focal abnormality. Adrenals/Urinary Tract: Adrenal glands are within normal limits. Kidneys show no hydronephrosis. Cyst in the midpole of the right kidney. The bladder is unremarkable Stomach/Bowel: The stomach is nonenlarged. Status post right hemicolectomy changes with small bowel colon anastomosis in the anterior midline abdomen. Slightly dilated segment of small bowel up to the anastomosis but similar compared to prior and no bowel wall thickening in the region. Possible mild small bowel wall thickening in the left lower quadrant of the abdomen but lack of enteral contrast precludes further evaluation. Remainder of the colon shows no inflammatory changes. Vascular/Lymphatic: Nonaneurysmal aorta. Nonspecific subcentimeter periaortic lymph nodes. Reproductive: Prostate gland calcifications. Slightly enlarged prostate. Other: Negative for  free air or free fluid. Bilateral fat filled inguinal hernias. No free  air or free fluid. Surgical changes along the ventral abdominal wall. Musculoskeletal: Trace retrolisthesis of L5 on S1. No acute or suspicious lesion. Stable possible hemangioma in L2. IMPRESSION: 1. Possible mild bowel wall thickening/area of active disease within left lower quadrant small bowel loops but without much surrounding inflammation, further evaluation limited without enteral contrast. 2. Otherwise no definite CT evidence for acute intra-abdominal or pelvic abnormality. Postsurgical changes compatible with prior right hemicolectomy and small bowel colon anastomosis within the anterior abdomen near the midline. 3. Contracted gallbladder with possible mild wall thickening. Correlation with ultrasound if symptomatology suggests gallbladder disease. Electronically Signed   By: Donavan Foil M.D.   On: 07/01/2017 18:24    Impression: 52 year old male with history of small bowel Crohn's disease originally diagnosed in 1988 and undergoing multiple small bowel resections, prior exploratory laparotomy with lysis of adhesions and ileostomy takedown with ileocolonic resection and primary anastomosis in Aug 2017. Has failed multiple agents as noted in HPI, and unfortunately he only received one induction dose of Stelara in [facility] Apr 06, 2017. Prior to this he was on Enytvio. Recent colonoscopy on file with moderately active Crohn's disease of distal ileum, path unknown. Clinically, he does not appear toxic and is in no distress. Discussed that due to his complicated past history, he would be best served at a tertiary facility at discharge as an outpatient. He was established at [facility] in 2015 prior to [facility] clinic. Concern remains for compliance with outpatient medication regimens.   Agree with steroid therapy currently: will advance diet to soft foods. Follow-up on pending GI pathogen panel. Will transition to prednisone when  tolerating diet.   Anemia: chronic. EGD/colonoscopy on file. History of multiple small bowel resections. Will need outpatient follow-up with Hematology due to likely need for iron infusions serially.  Discussed avoidance of high doses of aspirin: he is taking multiple doses of this daily.    Plan: Advance to soft diet Will change to oral prednisone when tolerate soft foods Will need evaluation at [facility] Follow-up on GI pathogen panel Avoidance of NSAIDs PPI BID Would benefit from Pain Management as outpatient as well   Annitta Needs, PhD, ANP-BC [facility]      LOS: 0 days    07/02/2017, 4:00 PM

## 2017-07-02 NOTE — Consult Note (Signed)
Byng A. Merlene Laughter, MD     [email or web address]          Walter Diaz is an 52 y.o. male.   ASSESSMENT/PLAN: 1. This appears to be a case of a single isolated demyelinating event which occurred a few years ago. The single event appears to have involved the brainstem. It is possible that he may have had a second lesion involving the optic nerve but this is not documented. Even this is the case, I would not recommend treatment for this patient. Even if he does meet criteria for a multiple sclerosis, it is obvious that he has very mild disease. Furthermore, he is being treated with immunosuppressive/immunomodulating therapy for his Crohn disease which would be expected to also treat his MS if indeed he does have this condition. The patient can follow-up in the office with [place] about 2-3 months after discharge.  2. 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 No additional workup or treatment is recommended at this time as his symptoms not debilitating.  3. Chronic low back pain     The patient is a 52 year old white male who apparently was diagnosed with multiple sclerosis pretends that he was worked up extensively at the [facility]. He had extensive imaging, labs and also spinal tap which apparently was done 2 to 3 times. Patient tells that he presented with what appears to be ophthalmoplegia involving the right eye with diplopia and right upper lower extremity numbness. He was told that he had a lesion involving the brainstem. One of his neurologist which he saw the [facility] reported to him that she was not comfortable giving him the diagnosis of MS because he only had a single lesion in the brainstem which makes perfect sense. However, there was some question of possible lesion involving what appears to me discussing with the patient the eye. He may have had visual evoked responses and possibly  brainstem auditory evoked responses. We don't have the workup and this is just in talking to the patient. The patient has never had any recurrent symptoms involving the right sided numbness and tingling pre-tablet he had a prolonged recovery with the right-sided sensory symptoms resolved after about 3 months. The ophthalmic problem last for about 8 months and he still has some residual problems with episodic binocular diplopia. He tells that he still has episodic etropia of the right eye. The patient does have some shooting lancinating pain involving the neck which radiates into the little finger on the left side. Symptoms occur with the elevation of the shoulder or turning his head and neck in certain direction. Sometimes he has similar symptoms involving the right shoulder although it has not radiated into the arm. He does not report having episodes of weakness involving the arms or legs. There are no bandlike sensory symptoms involving the abdomen or chest. The patient was on Copaxone but the medication was discontinued due to complications of the injection. He has not been on Copaxone since 2015. He has been on medications for Crohn disease for many years and still remains on this medication. The patient tells me that he has chronic pain issues involving the low back region which radiates to the right hip region. He reports seeing a Psychologist, sport and exercise who did not give him a good chance of having spine surgery and therefore the patient opted not to have spine procedure. The patient tells me that he has had abdominal abscess from injection. These  injections were medication use for his Crohn's disease. The review of systems is otherwise negative.   GENERAL: This a pleasant male who is in no acute distress.  HEENT: Normal  ABDOMEN: soft; multiple large scars from surgery and abscess.  EXTREMITIES: No edema   BACK: Normal alignment and no significant pain on palpation.  SKIN: Normal by inspection.    MENTAL  STATUS: Alert and oriented. Speech, language and cognition are generally intact. Judgment and insight normal.   CRANIAL NERVES: Pupils are equal, round and reactive to light and accomodation; extra ocular movements are full, there is no significant nystagmus; visual fields are full; upper and lower facial muscles are normal in strength and symmetric, there is no flattening of the nasolabial folds; tongue is midline; uvula is midline; shoulder elevation is normal.  MOTOR: Normal tone, bulk and strength; no pronator drift.  COORDINATION: Left finger to nose is normal, right finger to nose is normal, No rest tremor; no intention tremor; no postural tremor; no bradykinesia.  REFLEXES: Deep tendon reflexes are symmetrical but brisk throughout 3+. Plantar reflexes are flexor bilaterally.   SENSATION: Slightly reduced to temperature left leg but normal to light touch.      Blood pressure 119/86, pulse 88, temperature 98.1 F (36.7 C), resp. rate 18, height 6' (1.829 m), weight 180 lb (81.6 kg), SpO2 98 %.  Past Medical History:  Diagnosis Date  . Anemia   . Arthritis   . Asthma   . Cancer ([place])   . CHF (congestive heart failure) ([place])   . Chronic kidney disease   . Crohn disease ([place])   . Diabetes mellitus without complication ([place])   . Hypertension   . Multiple sclerosis ([place])   . RA (rheumatoid arthritis) ([place])   . Sleep apnea     Past Surgical History:  Procedure Laterality Date  . APPENDECTOMY    . COLONOSCOPY  03/2017   [place]: Moderately active Crohn's disease of distal ileum and ileocolonic anastomosis s/p biopsy, internal hemorrhoids  . ESOPHAGOGASTRODUODENOSCOPY  03/2017   [facility]: 1 cm hiatal hernia, irregular Z-line s/p biopsy, mild erosive gastritis, s/p biopsy. Path unavailable  . EXPLORATORY LAPAROTOMY  11/2015   [facility]: with lysis of adhesions and ileostomy takedown with ileocolonic resection and primary anastomosis   . HERNIA REPAIR    .  HERNIA REPAIR  2009   [facility]   . right knee surgery    . SMALL BOWEL REPAIR    . small bowel resections     multiple    Family History  Problem Relation Age of Onset  . Crohn's disease Mother   . Colon cancer Father        unknown    Social History:  reports that he quit smoking about 2 years ago. His smokeless tobacco use includes chew. He reports that he uses drugs. Drug: Marijuana. He reports that he does not drink alcohol.  Allergies:  Allergies  Allergen Reactions  . Hctz [Hydrochlorothiazide] Anaphylaxis  . Morphine And Related Itching    Medications: Prior to Admission medications   Medication Sig Start Date End Date Taking? Authorizing Provider  amphetamine-dextroamphetamine (ADDERALL) 10 MG tablet Take 10 mg by mouth 2 (two) times daily.   Yes [provider]  aspirin 500 MG tablet Take 2,000 mg by mouth every 6 (six) hours as needed for mild pain or moderate pain.   Yes [provider]  cyanocobalamin (,VITAMIN B-12,) 1000 MCG/ML injection Inject into the muscle every 30 (thirty)  days. 05/25/14  Yes [provider]  Multiple Vitamin (MULTIVITAMIN WITH MINERALS) TABS tablet Take 1 tablet by mouth daily.   Yes [provider]  ondansetron (ZOFRAN-ODT) 4 MG disintegrating tablet DISSOLVE 1 tablet ON THE TONGUE every 8 hours as needed for Nausea/Vomiting. 05/07/17  Yes [provider]  pantoprazole (PROTONIX) 40 MG tablet Take 40 mg by mouth daily. 03/21/17  Yes [provider]  PARoxetine (PAXIL) 20 MG tablet Take 20 mg by mouth at bedtime. 05/22/17  Yes [provider]  pregabalin (LYRICA) 75 MG capsule Take 75 mg by mouth 2 (two) times daily.   Yes [provider]  ustekinumab (STELARA) 90 MG/ML SOSY injection Inject into the skin every 8 (eight) weeks. 11/28/16  Yes [provider]    Scheduled Meds: . amphetamine-dextroamphetamine  10 mg Oral BID  . methylPREDNISolone (SOLU-MEDROL) injection   20 mg Intravenous Q8H  .  morphine injection  4 mg Intravenous Once  . pantoprazole (PROTONIX) IV  40 mg Intravenous Q24H  . PARoxetine  20 mg Oral QHS  . pregabalin  75 mg Oral BID   Continuous Infusions: . sodium chloride 75 mL/hr at 07/02/17 1346   PRN Meds:.acetaminophen **OR** acetaminophen, HYDROmorphone (DILAUDID) injection, ondansetron **OR** ondansetron (ZOFRAN) IV     Results for orders placed or performed during the hospital encounter of 07/01/17 (from the past 48 hour(s))  CBC with Differential     Status: Abnormal   Collection Time: 07/01/17  3:35 PM  Result Value Ref Range   WBC 9.8 4.0 - 10.5 K/uL   RBC 4.13 (L) 4.22 - 5.81 MIL/uL   Hemoglobin 8.4 (L) 13.0 - 17.0 g/dL   HCT 28.6 (L) 39.0 - 52.0 %   MCV 69.2 (L) 78.0 - 100.0 fL   MCH 20.3 (L) 26.0 - 34.0 pg   MCHC 29.4 (L) 30.0 - 36.0 g/dL   RDW 17.7 (H) 11.5 - 15.5 %   Platelets 479 (H) 150 - 400 K/uL   Neutrophils Relative % 79 %   Lymphocytes Relative 12 %   Monocytes Relative 8 %   Eosinophils Relative 1 %   Basophils Relative 0 %   Neutro Abs 7.7 1.7 - 7.7 K/uL   Lymphs Abs 1.2 0.7 - 4.0 K/uL   Monocytes Absolute 0.8 0.1 - 1.0 K/uL   Eosinophils Absolute 0.1 0.0 - 0.7 K/uL   Basophils Absolute 0.0 0.0 - 0.1 K/uL   RBC Morphology ANISOCYTES     Comment: Performed at [facility], [address] [place], [place] [address]  Comprehensive metabolic panel     Status: Abnormal   Collection Time: 07/01/17  3:35 PM  Result Value Ref Range   Sodium 140 135 - 145 mmol/L   Potassium 4.7 3.5 - 5.1 mmol/L   Chloride 109 101 - 111 mmol/L   CO2 22 22 - 32 mmol/L   Glucose, Bld 98 65 - 99 mg/dL   BUN 31 (H) 6 - 20 mg/dL   Creatinine, Ser 1.28 (H) 0.61 - 1.24 mg/dL   Calcium 9.1 8.9 - 10.3 mg/dL   Total Protein 6.7 6.5 - 8.1 g/dL   Albumin 3.3 (L) 3.5 - 5.0 g/dL   AST 21 15 - 41 U/L   ALT 30 17 - 63 U/L   Alkaline Phosphatase 35 (L) 38 - 126 U/L   Total Bilirubin 0.3 0.3 - 1.2 mg/dL   GFR calc non Af Amer >60  >60 mL/min   GFR calc Af Amer >60 >  60 mL/min    Comment: (NOTE) The eGFR has been calculated using the CKD EPI equation. This calculation has not been validated in all clinical situations. eGFR's persistently <60 mL/min signify possible Chronic Kidney Disease.    Anion gap 9 5 - 15    Comment: Performed at [facility], [address] [place], [place] [address]  POC occult blood, ED     Status: Abnormal   Collection Time: 07/01/17  4:02 PM  Result Value Ref Range   Fecal Occult Bld POSITIVE (A) NEGATIVE  Lipase, blood     Status: None   Collection Time: 07/01/17  4:13 PM  Result Value Ref Range   Lipase 48 11 - 51 U/L    Comment: Performed at [facility], [address] [place], [place] [address]  Urinalysis, Routine w reflex microscopic     Status: Abnormal   Collection Time: 07/01/17  6:30 PM  Result Value Ref Range   Color, Urine YELLOW YELLOW   APPearance CLEAR CLEAR   Specific Gravity, Urine 1.032 (H) 1.005 - 1.030   pH 5.0 5.0 - 8.0   Glucose, UA NEGATIVE NEGATIVE mg/dL   Hgb urine dipstick NEGATIVE NEGATIVE   Bilirubin Urine NEGATIVE NEGATIVE   Ketones, ur NEGATIVE NEGATIVE mg/dL   Protein, ur NEGATIVE NEGATIVE mg/dL   Nitrite NEGATIVE NEGATIVE   Leukocytes, UA NEGATIVE NEGATIVE    Comment: Performed at [facility], [address] [place], [place] [address]  Hemoglobin     Status: Abnormal   Collection Time: 07/01/17  8:45 PM  Result Value Ref Range   Hemoglobin 8.0 (L) 13.0 - 17.0 g/dL    Comment: Performed at [facility], [address] [place], [place] [address]  Hematocrit     Status: Abnormal   Collection Time: 07/01/17  8:45 PM  Result Value Ref Range   HCT 27.6 (L) 39.0 - 52.0 %    Comment: Performed at [facility], [address], [place], [place] [address]  C-reactive protein     Status: None   Collection Time: 07/01/17  8:48 PM  Result Value Ref Range   CRP <0.8 <1.0 mg/dL    Comment: Performed at [facility] [address]  [place], [place] [address]  C difficile quick scan w PCR reflex     Status: None   Collection Time: 07/02/17  2:50 AM  Result Value Ref Range   C Diff antigen NEGATIVE NEGATIVE   C Diff toxin NEGATIVE NEGATIVE   C Diff interpretation No C. difficile detected.     Comment: VALID Performed at [facility], [address], [place],  [address]   Gastrointestinal Panel by PCR , Stool     Status: None   Collection Time: 07/02/17  2:50 AM  Result Value Ref Range   Campylobacter species NOT DETECTED NOT DETECTED   Plesimonas shigelloides NOT DETECTED NOT DETECTED   Salmonella species NOT DETECTED NOT DETECTED   Yersinia enterocolitica NOT DETECTED NOT DETECTED   Vibrio species NOT DETECTED NOT DETECTED   Vibrio cholerae NOT DETECTED NOT DETECTED   Enteroaggregative E coli (EAEC) NOT DETECTED NOT DETECTED   Enteropathogenic E coli (EPEC) NOT DETECTED NOT DETECTED   Enterotoxigenic E coli (ETEC) NOT DETECTED NOT DETECTED   Shiga like toxin producing E coli (STEC) NOT DETECTED NOT DETECTED   Shigella/Enteroinvasive E coli (EIEC) NOT DETECTED NOT DETECTED   Cryptosporidium NOT DETECTED NOT DETECTED   Cyclospora cayetanensis NOT DETECTED NOT DETECTED   Entamoeba histolytica  NOT DETECTED NOT DETECTED   Giardia lamblia NOT DETECTED NOT DETECTED   Adenovirus F40/41 NOT DETECTED NOT DETECTED   Astrovirus NOT DETECTED NOT DETECTED   Norovirus GI/GII NOT DETECTED NOT DETECTED   Rotavirus A NOT DETECTED NOT DETECTED   Sapovirus (I, II, IV, and V) NOT DETECTED NOT DETECTED    Comment: Performed at [facility], [address] [place], [place] [address]  Comprehensive metabolic panel     Status: Abnormal   Collection Time: 07/02/17  3:28 AM  Result Value Ref Range   Sodium 135 135 - 145 mmol/L   Potassium 4.9 3.5 - 5.1 mmol/L   Chloride 106 101 - 111 mmol/L   CO2 21 (L) 22 - 32 mmol/L   Glucose, Bld 103 (H) 65 - 99 mg/dL   BUN 25 (H) 6 - 20 mg/dL   Creatinine, Ser 1.24 0.61 - 1.24  mg/dL   Calcium 8.7 (L) 8.9 - 10.3 mg/dL   Total Protein 6.5 6.5 - 8.1 g/dL   Albumin 3.2 (L) 3.5 - 5.0 g/dL   AST 18 15 - 41 U/L   ALT 27 17 - 63 U/L   Alkaline Phosphatase 34 (L) 38 - 126 U/L   Total Bilirubin 0.1 (L) 0.3 - 1.2 mg/dL   GFR calc non Af Amer >60 >60 mL/min   GFR calc Af Amer >60 >60 mL/min    Comment: (NOTE) The eGFR has been calculated using the CKD EPI equation. This calculation has not been validated in all clinical situations. eGFR's persistently <60 mL/min signify possible Chronic Kidney Disease.    Anion gap 8 5 - 15    Comment: Performed at [facility], [address] [place], [place] [address]  CBC     Status: Abnormal   Collection Time: 07/02/17  3:28 AM  Result Value Ref Range   WBC 6.6 4.0 - 10.5 K/uL   RBC 4.13 (L) 4.22 - 5.81 MIL/uL   Hemoglobin 8.4 (L) 13.0 - 17.0 g/dL   HCT 28.7 (L) 39.0 - 52.0 %   MCV 69.5 (L) 78.0 - 100.0 fL   MCH 20.3 (L) 26.0 - 34.0 pg   MCHC 29.3 (L) 30.0 - 36.0 g/dL   RDW 17.7 (H) 11.5 - 15.5 %   Platelets 460 (H) 150 - 400 K/uL    Comment: Performed at [facility], [address] [place], [place] [address]  Glucose, capillary     Status: Abnormal   Collection Time: 07/02/17  7:48 AM  Result Value Ref Range   Glucose-Capillary 109 (H) 65 - 99 mg/dL  Iron and TIBC     Status: Abnormal   Collection Time: 07/02/17  8:34 AM  Result Value Ref Range   Iron 17 (L) 45 - 182 ug/dL   TIBC 519 (H) 250 - 450 ug/dL   Saturation Ratios 3 (L) 17.9 - 39.5 %   UIBC 502 ug/dL    Comment: Performed at [facility], [place] [address] [place], [place] [address]  Ferritin     Status: Abnormal   Collection Time: 07/02/17  8:34 AM  Result Value Ref Range   Ferritin 7 (L) 24 - 336 ng/mL    Comment: Performed at [facility], [place] [address], [place], [place] [address]  Hemoglobin     Status: Abnormal   Collection Time: 07/02/17  8:39 AM  Result Value Ref Range   Hemoglobin 8.9 (L) 13.0 - 17.0 g/dL    Comment: Performed at  [facility], [address], [place], [place] [address]  Hematocrit     Status: Abnormal   Collection Time: 07/02/17  8:39 AM  Result Value Ref Range   HCT 30.6 (L) 39.0 - 52.0 %    Comment: Performed at [facility], [address], [place], [place] [address]  Sedimentation rate     Status: Abnormal   Collection Time: 07/02/17  8:39 AM  Result Value Ref Range   Sed Rate 30 (H) 0 - 16 mm/hr    Comment: Performed at [facility], [address] [place], [place] [address]  Hemoglobin     Status: Abnormal   Collection Time: 07/02/17  2:56 PM  Result Value Ref Range   Hemoglobin 8.6 (L) 13.0 - 17.0 g/dL    Comment: Performed at [facility], [address] [place], [place] [address]  Hematocrit     Status: Abnormal   Collection Time: 07/02/17  2:56 PM  Result Value Ref Range   HCT 29.3 (L) 39.0 - 52.0 %    Comment: Performed at [facility], [address] [place], [place] [address]    Studies/Results:      BRAIN MRI W/WO FINDINGS: Brain: Image quality degraded by motion. The patient was claustrophobic and motion was progressive throughout the study.  Ventricle size normal. Cerebral volume normal. Punctate hyperintensities in the subcortical white matter bilaterally best seen on the prior study which is of better quality. These are obscured by motion artifact today. No new white matter lesions. Brainstem and cerebellum and basal ganglia normal. Negative for acute infarct. Negative for hemorrhage or mass. Cerebellar tonsils 4 mm below the foramen magnum, this is the upper limits of normal.  Normal enhancement following contrast infusion.  Vascular: Normal arterial flow voids  Skull and upper cervical spine: Negative  Sinuses/Orbits: Extensive mucosal edema throughout the paranasal sinuses with progression from the prior study. Normal orbit. Mastoid sinus clear bilaterally.  Other: None  IMPRESSION: Image quality degraded by motion. This obscures the small  white matter lesion seen previously. These are not typical for multiple sclerosis. No new lesions and no acute abnormality.  Extensive mucosal edema paranasal sinuses with progression from the prior study  Cerebellar tonsils low lying but without definite Chiari malformation.       The brain MRI is reviewed in person. I do not appreciate any significant white matter lesions. There may be at 1 or 2 tiny lesions but these are clearly not typical or concerning for multiple sclerosis. No hemorrhages appreciated. No acute findings are seen. The cerebellum is low lying although not meeting the criteria for a Chiari malformation.     C SPINE W/WO MRI  2015 FINDINGS:  Cerebellar tonsils minimally low lying but within the range normal  limits.   No focal cervical cord signal abnormality or enhancement.   Visualized paravertebral structures unremarkable.   C2-3: Negative.   C3-4:  Negative.   C4-5:  Negative.   C5-6: Bulge greater to the right. Mild spinal stenosis greater on  right. Minimal cord deflection greater on the right. Minimal right  foraminal narrowing.   C6-7: Bulge. Mild narrowing of the ventral aspect of the thecal sac.  No cord compression. Mild right uncinate hypertrophy with mild to  slightly moderate right foraminal narrowing.   C7-T1: Bulge. Mild narrowing of the ventral aspect of thecal sac. No  cord compression. Minimal foraminal narrowing greater on the right.   IMPRESSION:  No focal cervical cord signal abnormality or enhancement.  C5-6 bulge greater to the right. Mild spinal stenosis greater on  right. Minimal cord deflection greater on the right. Minimal right  foraminal narrowing.   C6-7 bulge. Mild narrowing of the ventral aspect of the thecal sac.  No cord compression. Mild right uncinate hypertrophy with mild to  slightly moderate right foraminal narrowing.   C7-T1 bulge. Mild narrowing of the ventral aspect of thecal sac. No   cord compression. Minimal foraminal narrowing greater on the right.                [place] A. Merlene Laughter, M.D.  Diplomate, Tax adviser of Psychiatry and Neurology ( Neurology). 07/02/2017, 6:42 PM

## 2017-07-03 ENCOUNTER — Telehealth: Payer: Self-pay | Admitting: Gastroenterology

## 2017-07-03 ENCOUNTER — Other Ambulatory Visit: Payer: Self-pay

## 2017-07-03 DIAGNOSIS — D500 Iron deficiency anemia secondary to blood loss (chronic): Secondary | ICD-10-CM

## 2017-07-03 DIAGNOSIS — K50918 Crohn's disease, unspecified, with other complication: Secondary | ICD-10-CM

## 2017-07-03 DIAGNOSIS — D649 Anemia, unspecified: Secondary | ICD-10-CM

## 2017-07-03 DIAGNOSIS — K50911 Crohn's disease, unspecified, with rectal bleeding: Secondary | ICD-10-CM

## 2017-07-03 LAB — BASIC METABOLIC PANEL
Anion gap: 8 (ref 5–15)
BUN: 16 mg/dL (ref 6–20)
CALCIUM: 8.9 mg/dL (ref 8.9–10.3)
CO2: 23 mmol/L (ref 22–32)
CREATININE: 1.06 mg/dL (ref 0.61–1.24)
Chloride: 106 mmol/L (ref 101–111)
Glucose, Bld: 125 mg/dL — ABNORMAL HIGH (ref 65–99)
Potassium: 4.6 mmol/L (ref 3.5–5.1)
SODIUM: 137 mmol/L (ref 135–145)

## 2017-07-03 LAB — CBC
HCT: 28.1 % — ABNORMAL LOW (ref 39.0–52.0)
Hemoglobin: 8.1 g/dL — ABNORMAL LOW (ref 13.0–17.0)
MCH: 19.9 pg — AB (ref 26.0–34.0)
MCHC: 28.8 g/dL — AB (ref 30.0–36.0)
MCV: 68.9 fL — ABNORMAL LOW (ref 78.0–100.0)
PLATELETS: 492 10*3/uL — AB (ref 150–400)
RBC: 4.08 MIL/uL — ABNORMAL LOW (ref 4.22–5.81)
RDW: 17.8 % — ABNORMAL HIGH (ref 11.5–15.5)
WBC: 7.7 10*3/uL (ref 4.0–10.5)

## 2017-07-03 LAB — GLUCOSE, CAPILLARY: GLUCOSE-CAPILLARY: 102 mg/dL — AB (ref 65–99)

## 2017-07-03 LAB — HIV ANTIBODY (ROUTINE TESTING W REFLEX): HIV SCREEN 4TH GENERATION: NONREACTIVE

## 2017-07-03 LAB — MAGNESIUM: MAGNESIUM: 1.8 mg/dL (ref 1.7–2.4)

## 2017-07-03 MED ORDER — FERROUS SULFATE 325 (65 FE) MG PO TABS: 325.0000 mg | ORAL_TABLET | Freq: Two times a day (BID) | ORAL | Status: DC

## 2017-07-03 MED ORDER — FERROUS SULFATE 325 (65 FE) MG PO TABS: 325.0000 mg | ORAL_TABLET | Freq: Two times a day (BID) | ORAL | 3 refills | Status: AC

## 2017-07-03 MED ORDER — PANTOPRAZOLE SODIUM 40 MG PO TBEC
40.0000 mg | DELAYED_RELEASE_TABLET | Freq: Every day | ORAL | Status: DC
  Administered 2017-07-03: 40 mg via ORAL
  Filled 2017-07-03: qty 1

## 2017-07-03 MED ORDER — HEPARIN SOD (PORK) LOCK FLUSH 100 UNIT/ML IV SOLN
500.0000 [IU] | INTRAVENOUS | Status: AC | PRN
  Administered 2017-07-03: 500 [IU]
  Filled 2017-07-03: qty 5

## 2017-07-03 MED ORDER — OXYCODONE-ACETAMINOPHEN 5-325 MG PO TABS
1.0000 | ORAL_TABLET | ORAL | 0 refills | Status: AC | PRN
Start: 2017-07-03 — End: ?

## 2017-07-03 MED ORDER — PREDNISONE 10 MG PO TABS: 40.0000 mg | ORAL_TABLET | Freq: Every day | ORAL | 0 refills | Status: AC

## 2017-07-03 MED ORDER — PREDNISONE 20 MG PO TABS: 40.0000 mg | ORAL_TABLET | Freq: Every day | ORAL | Status: DC

## 2017-07-03 NOTE — Discharge Summary (Signed)
Physician Discharge Summary  Kordel Leavy JSH:[id number] DOB: [birth year] DOA: 07/01/2017  PCP: Patient, No Pcp Per  Admit date: 07/01/2017 Discharge date: 07/03/2017  Admitted From: Home Disposition:  Home  Recommendations for Outpatient Follow-up:  1. Follow up with PCP in 1-2 weeks 2. Please obtain BMP/CBC in one week    Discharge Condition: Stable CODE STATUS:FULL Diet recommendation: soft   Brief/Interim Summary: 52 year old male with a history of multiple sclerosis, Crohn's colitis status post right hemicolectomy with small bowel anastomosis, depression, GERD presenting with 10-day history of abdominal pain and increasing hematochezia.  The patient  has recently moved from [place] to [place] to be with his family.  He last saw his gastroenterologist before Thanksgiving 2018.  He is currently on Stelara for his Crohn's  enterocolitis.  He states that his last dose was 04/19/2017.  Because he has moved to [facility], he has been lost to follow-up with his GI physician.  He presented to emergency department on 06/22/2017 with abdominal pain.  He was discharged with Percocet and Zofran in stable condition.  He has not established with any physicians in [place].  Because of persistent pain and increasing loose stools and hematochezia, he presented to the emergency department.  He normally has 5-6 bowel movements daily, but in the past week he has had increasing loose stools, 10-15/day.  He has had some subjective fevers and chills.  He had one episode of emesis on 07/01/2017.  He denies any headache, chest pain, shortness of breath, hemoptysis.  Notably, the patient states that he has been taking up to 12 aspirin at least 4 days out of each week.  Regarding his multiple sclerosis, the patient complains of diplopia times 3 days.  He was diagnosed with multiple sclerosis back in April 2012.  He has not followed up with a neurologist since May 2018.  He was previously taking  Copaxone for his multiple sclerosis, but has not been on any immunomodulators.  He denies any new extremity weakness, headaches, but he complains of chronic radicular type pain in his left  upper extremity which has not changed.    GI and neurology were consulted to assist.  After 24 hours of steroids, IV fluids, and opioids, the patient significantly improved.  His diet was advanced which he tolerated.  The patient was cleared for discharge by GI with a prednisone taper.  He will need outpatient referral to [facility] gastroenterology which GI will arrange.     Discharge Diagnoses:  Hematochezia/abdominal pain/diarrhea--?Crohn's Colitis Exacerbation -07/02/2017 CT abdomen--status post right hemicolectomy with small bowel anastomosis.  Possible small bowel wall thickening at LLQ without significant inflammatory changes -GI consult appreciated-->home with prednisone taper-->prednisone 40 mg daily x 2 weeks, then 30 mg daily x 1 week, then 20 mg daily x 1 week, then 10 mg daily x 1 week -outpt referral to [facility] -Continue IV Solu-Medrol>>wean to po prednisone -Discontinue Zosyn -C. difficile negative -follow-up GI pathogen panel--neg -Check CRP--<0.8 and ESR--30 -07/03/17--significant clinical improvement--tolerating diet  Diplopia -Concerned about multiple sclerosis flare -Neurology consult--did not feel pt had clinical syndrome consistent with MS-->no treatment at this time -MRI brain with and without gadolinium--no new lesions.  No typical lesions of MS.  Paranasal sinus mucosal edema.  Microcytic anemia -Secondary to chronic blood loss -Baseline hemoglobin~9 upon review of medical records - iron saturation 3%.  Ferritin 7--> patient did not want to stay for IV iron.  Start oral ferrous sulfate twice daily. -Hgb stable through the hospitalization  Depression/anxiety -  Continue Paxil -Continue home dose of Adderall  Chronic pain syndrome -Continue Lyrica -Judicious opioids--home with  po percocet 5/325, #10, no RF  Dehydration -Continue IV fluids-->improved      Discharge Instructions  Discharge Instructions    Diet - low sodium heart healthy   Complete by:  As directed    Increase activity slowly   Complete by:  As directed      Allergies as of 07/03/2017      Reactions   Hctz [hydrochlorothiazide] Anaphylaxis   Morphine And Related Itching      Medication List    STOP taking these medications   aspirin 500 MG tablet     TAKE these medications   amphetamine-dextroamphetamine 10 MG tablet Commonly known as:  ADDERALL Take 10 mg by mouth 2 (two) times daily.   cyanocobalamin 1000 MCG/ML injection Commonly known as:  (VITAMIN B-12) Inject into the muscle every 30 (thirty) days.   ferrous sulfate 325 (65 FE) MG tablet Take 1 tablet (325 mg total) by mouth 2 (two) times daily with a meal.   multivitamin with minerals Tabs tablet Take 1 tablet by mouth daily.   ondansetron 4 MG disintegrating tablet Commonly known as:  ZOFRAN-ODT DISSOLVE 1 tablet ON THE TONGUE every 8 hours as needed for Nausea/Vomiting.   oxyCODONE-acetaminophen 5-325 MG tablet Commonly known as:  PERCOCET Take 1-2 tablets by mouth every 4 (four) hours as needed for moderate pain or severe pain.   pantoprazole 40 MG tablet Commonly known as:  PROTONIX Take 40 mg by mouth daily.   PARoxetine 20 MG tablet Commonly known as:  PAXIL Take 20 mg by mouth at bedtime.   predniSONE 10 MG tablet Commonly known as:  DELTASONE Take 4 tablets (40 mg total) by mouth daily with breakfast. X 2 weeks.  Then 3 tablets (30 mg) daily x 7 days.  Then 2 tablets (20 mg) daily x 7 days.  Then 1 tablet (10 mg) daily x 7 days Start taking on:  07/04/2017   pregabalin 75 MG capsule Commonly known as:  LYRICA Take 75 mg by mouth 2 (two) times daily.   ustekinumab 90 MG/ML Sosy injection Commonly known as:  STELARA Inject into the skin every 8 (eight) weeks.       Allergies  Allergen  Reactions  . Hctz [Hydrochlorothiazide] Anaphylaxis  . Morphine And Related Itching    Consultations:  GI--[facility]   Procedures/Studies: Dg Chest 2 View  Result Date: 06/22/2017 CLINICAL DATA:  Productive cough x1 week. EXAM: CHEST  2 VIEW COMPARISON:  05/06/2014 FINDINGS: The heart size and mediastinal contours are within normal limits. Port catheter tip is noted in the mid SVC. Both lungs are clear. The visualized skeletal structures are unremarkable. IMPRESSION: No active cardiopulmonary disease. Electronically Signed   By: Ashley Royalty M.D.   On: 06/22/2017 20:24   Mr Jeri Cos PY Contrast  Result Date: 07/02/2017 CLINICAL DATA:  Multiple sclerosis EXAM: MRI HEAD WITHOUT AND WITH CONTRAST TECHNIQUE: Multiplanar, multiecho pulse sequences of the brain and surrounding structures were obtained without and with intravenous contrast. CONTRAST:  35m MULTIHANCE GADOBENATE DIMEGLUMINE 529 MG/ML IV SOLN COMPARISON:  CT 05/06/2014, MRI 05/07/2013 FINDINGS: Brain: Image quality degraded by motion. The patient was claustrophobic and motion was progressive throughout the study. Ventricle size normal. Cerebral volume normal. Punctate hyperintensities in the subcortical white matter bilaterally best seen on the prior study which is of better quality. These are obscured by motion artifact today. No new white matter lesions.  Brainstem and cerebellum and basal ganglia normal. Negative for acute infarct. Negative for hemorrhage or mass. Cerebellar tonsils 4 mm below the foramen magnum, this is the upper limits of normal. Normal enhancement following contrast infusion. Vascular: Normal arterial flow voids Skull and upper cervical spine: Negative Sinuses/Orbits: Extensive mucosal edema throughout the paranasal sinuses with progression from the prior study. Normal orbit. Mastoid sinus clear bilaterally. Other: None IMPRESSION: Image quality degraded by motion. This obscures the small white matter lesion seen previously.  These are not typical for multiple sclerosis. No new lesions and no acute abnormality. Extensive mucosal edema paranasal sinuses with progression from the prior study Cerebellar tonsils low lying but without definite Chiari malformation. Electronically Signed   By: Franchot Gallo M.D.   On: 07/02/2017 12:09   Ct Abdomen Pelvis W Contrast  Result Date: 07/01/2017 CLINICAL DATA:  Bloody stool history of Crohn's EXAM: CT ABDOMEN AND PELVIS WITH CONTRAST TECHNIQUE: Multidetector CT imaging of the abdomen and pelvis was performed using the standard protocol following bolus administration of intravenous contrast. CONTRAST:  19m ISOVUE-300 IOPAMIDOL (ISOVUE-300) INJECTION 61% COMPARISON:  07/30/2014, 05/26/2014, 05/06/2014, 02/20/2014, 06/22/2017 FINDINGS: Lower chest: Patchy dependent atelectasis at the lung bases. No consolidation pleural effusion. Heart size within normal limits. Hepatobiliary: No focal hepatic abnormality. Similar linear calcification or density along the diaphragmatic surface of the liver. Contracted gallbladder with possible mild wall thickening. No biliary dilatation. Pancreas: Unremarkable. No pancreatic ductal dilatation or surrounding inflammatory changes. Spleen: Normal in size without focal abnormality. Adrenals/Urinary Tract: Adrenal glands are within normal limits. Kidneys show no hydronephrosis. Cyst in the midpole of the right kidney. The bladder is unremarkable Stomach/Bowel: The stomach is nonenlarged. Status post right hemicolectomy changes with small bowel colon anastomosis in the anterior midline abdomen. Slightly dilated segment of small bowel up to the anastomosis but similar compared to prior and no bowel wall thickening in the region. Possible mild small bowel wall thickening in the left lower quadrant of the abdomen but lack of enteral contrast precludes further evaluation. Remainder of the colon shows no inflammatory changes. Vascular/Lymphatic: Nonaneurysmal aorta.  Nonspecific subcentimeter periaortic lymph nodes. Reproductive: Prostate gland calcifications. Slightly enlarged prostate. Other: Negative for free air or free fluid. Bilateral fat filled inguinal hernias. No free air or free fluid. Surgical changes along the ventral abdominal wall. Musculoskeletal: Trace retrolisthesis of L5 on S1. No acute or suspicious lesion. Stable possible hemangioma in L2. IMPRESSION: 1. Possible mild bowel wall thickening/area of active disease within left lower quadrant small bowel loops but without much surrounding inflammation, further evaluation limited without enteral contrast. 2. Otherwise no definite CT evidence for acute intra-abdominal or pelvic abnormality. Postsurgical changes compatible with prior right hemicolectomy and small bowel colon anastomosis within the anterior abdomen near the midline. 3. Contracted gallbladder with possible mild wall thickening. Correlation with ultrasound if symptomatology suggests gallbladder disease. Electronically Signed   By: KDonavan FoilM.D.   On: 07/01/2017 18:24   Ct Abdomen Pelvis W Contrast  Result Date: 06/22/2017 CLINICAL DATA:  History of Crohn's, unable to urinate for 3 days history of MS EXAM: CT ABDOMEN AND PELVIS WITH CONTRAST TECHNIQUE: Multidetector CT imaging of the abdomen and pelvis was performed using the standard protocol following bolus administration of intravenous contrast. CONTRAST:  1039mISOVUE-300 IOPAMIDOL (ISOVUE-300) INJECTION 61% COMPARISON:  CT 09/15/2014, 07/30/2014, 05/26/2014, 05/06/2014, 02/20/2014 FINDINGS: Lower chest: Lung bases demonstrate no acute consolidation or effusion. Normal heart size. Hepatobiliary: No focal liver abnormality is seen. No gallstones, gallbladder wall thickening, or  biliary dilatation. Pancreas: Unremarkable. No pancreatic ductal dilatation or surrounding inflammatory changes. Spleen: Normal in size without focal abnormality. Adrenals/Urinary Tract: Adrenal glands are within  normal limits. Multiple cysts in the right kidney. Subcentimeter hypodensity mid left kidney too small to further characterize. No hydronephrosis. The bladder is normal Stomach/Bowel: Right hemicolectomy changes with small bowel colon anastomosis in the upper abdomen, at the midline. Diffuse fluid within the residual colon. Focal area of narrowing within the rectosigmoid colon, series 2, image number 66. There is mild dilatation of the small bowel upstream of the anastomosis measuring up to 4.8 cm. No definite small bowel thickening. Remainder of the small bowel is collapsed. The stomach is within normal limits. Vascular/Lymphatic: Nonaneurysmal aorta. No significantly enlarged lymph nodes. Reproductive: Slightly enlarged prostate with coarse calcification Other: No free air or free fluid. Fat in the inguinal canals bilaterally. Musculoskeletal: Stable lucent lesion in L2. No acute or suspicious lesion. Mild retrolisthesis of L4 on L5. IMPRESSION: 1. Kidneys show no hydronephrosis or acute abnormality. 2. Status post right hemi colectomy with small bowel colon anastomosis noted within the anterior abdomen at the midline. The segment of small bowel immediately upstream to the anastomosis is slightly dilated and fluid-filled. The remainder of the small bowel is unremarkable. There is a focal area of narrowing and soft tissue thickening in the rectosigmoid colon which may be secondary to spasm, stricture, or focal area of acute disease although not much surrounding inflammation. Electronically Signed   By: Donavan Foil M.D.   On: 06/22/2017 21:42        Discharge Exam: Vitals:   07/03/17 0453 07/03/17 1300  BP: 130/89 (!) 143/82  Pulse: 64 84  Resp:  20  Temp: 98.1 F (36.7 C)   SpO2: 100% 98%   Vitals:   07/02/17 1400 07/02/17 2027 07/03/17 0453 07/03/17 1300  BP: 119/86 (!) 160/89 130/89 (!) 143/82  Pulse: 88 100 64 84  Resp: 18 18  20   Temp: 98.1 F (36.7 C) 98.7 F (37.1 C) 98.1 F (36.7  C)   TempSrc:  Oral Oral   SpO2: 98% 98% 100% 98%  Weight:   81.6 kg (179 lb 14.3 oz)   Height:        General: Pt is alert, awake, not in acute distress Cardiovascular: RRR, S1/S2 +, no rubs, no gallops Respiratory: CTA bilaterally, no wheezing, no rhonchi Abdominal: Soft, NT, ND, bowel sounds + Extremities: no edema, no cyanosis   The results of significant diagnostics from this hospitalization (including imaging, microbiology, ancillary and laboratory) are listed below for reference.    Significant Diagnostic Studies: Dg Chest 2 View  Result Date: 06/22/2017 CLINICAL DATA:  Productive cough x1 week. EXAM: CHEST  2 VIEW COMPARISON:  05/06/2014 FINDINGS: The heart size and mediastinal contours are within normal limits. Port catheter tip is noted in the mid SVC. Both lungs are clear. The visualized skeletal structures are unremarkable. IMPRESSION: No active cardiopulmonary disease. Electronically Signed   By: Ashley Royalty M.D.   On: 06/22/2017 20:24   Mr Jeri Cos ML Contrast  Result Date: 07/02/2017 CLINICAL DATA:  Multiple sclerosis EXAM: MRI HEAD WITHOUT AND WITH CONTRAST TECHNIQUE: Multiplanar, multiecho pulse sequences of the brain and surrounding structures were obtained without and with intravenous contrast. CONTRAST:  55m MULTIHANCE GADOBENATE DIMEGLUMINE 529 MG/ML IV SOLN COMPARISON:  CT 05/06/2014, MRI 05/07/2013 FINDINGS: Brain: Image quality degraded by motion. The patient was claustrophobic and motion was progressive throughout the study. Ventricle size normal. Cerebral volume normal.  Punctate hyperintensities in the subcortical white matter bilaterally best seen on the prior study which is of better quality. These are obscured by motion artifact today. No new white matter lesions. Brainstem and cerebellum and basal ganglia normal. Negative for acute infarct. Negative for hemorrhage or mass. Cerebellar tonsils 4 mm below the foramen magnum, this is the upper limits of normal. Normal  enhancement following contrast infusion. Vascular: Normal arterial flow voids Skull and upper cervical spine: Negative Sinuses/Orbits: Extensive mucosal edema throughout the paranasal sinuses with progression from the prior study. Normal orbit. Mastoid sinus clear bilaterally. Other: None IMPRESSION: Image quality degraded by motion. This obscures the small white matter lesion seen previously. These are not typical for multiple sclerosis. No new lesions and no acute abnormality. Extensive mucosal edema paranasal sinuses with progression from the prior study Cerebellar tonsils low lying but without definite Chiari malformation. Electronically Signed   By: Franchot Gallo M.D.   On: 07/02/2017 12:09   Ct Abdomen Pelvis W Contrast  Result Date: 07/01/2017 CLINICAL DATA:  Bloody stool history of Crohn's EXAM: CT ABDOMEN AND PELVIS WITH CONTRAST TECHNIQUE: Multidetector CT imaging of the abdomen and pelvis was performed using the standard protocol following bolus administration of intravenous contrast. CONTRAST:  142m ISOVUE-300 IOPAMIDOL (ISOVUE-300) INJECTION 61% COMPARISON:  07/30/2014, 05/26/2014, 05/06/2014, 02/20/2014, 06/22/2017 FINDINGS: Lower chest: Patchy dependent atelectasis at the lung bases. No consolidation pleural effusion. Heart size within normal limits. Hepatobiliary: No focal hepatic abnormality. Similar linear calcification or density along the diaphragmatic surface of the liver. Contracted gallbladder with possible mild wall thickening. No biliary dilatation. Pancreas: Unremarkable. No pancreatic ductal dilatation or surrounding inflammatory changes. Spleen: Normal in size without focal abnormality. Adrenals/Urinary Tract: Adrenal glands are within normal limits. Kidneys show no hydronephrosis. Cyst in the midpole of the right kidney. The bladder is unremarkable Stomach/Bowel: The stomach is nonenlarged. Status post right hemicolectomy changes with small bowel colon anastomosis in the anterior  midline abdomen. Slightly dilated segment of small bowel up to the anastomosis but similar compared to prior and no bowel wall thickening in the region. Possible mild small bowel wall thickening in the left lower quadrant of the abdomen but lack of enteral contrast precludes further evaluation. Remainder of the colon shows no inflammatory changes. Vascular/Lymphatic: Nonaneurysmal aorta. Nonspecific subcentimeter periaortic lymph nodes. Reproductive: Prostate gland calcifications. Slightly enlarged prostate. Other: Negative for free air or free fluid. Bilateral fat filled inguinal hernias. No free air or free fluid. Surgical changes along the ventral abdominal wall. Musculoskeletal: Trace retrolisthesis of L5 on S1. No acute or suspicious lesion. Stable possible hemangioma in L2. IMPRESSION: 1. Possible mild bowel wall thickening/area of active disease within left lower quadrant small bowel loops but without much surrounding inflammation, further evaluation limited without enteral contrast. 2. Otherwise no definite CT evidence for acute intra-abdominal or pelvic abnormality. Postsurgical changes compatible with prior right hemicolectomy and small bowel colon anastomosis within the anterior abdomen near the midline. 3. Contracted gallbladder with possible mild wall thickening. Correlation with ultrasound if symptomatology suggests gallbladder disease. Electronically Signed   By: KDonavan FoilM.D.   On: 07/01/2017 18:24   Ct Abdomen Pelvis W Contrast  Result Date: 06/22/2017 CLINICAL DATA:  History of Crohn's, unable to urinate for 3 days history of MS EXAM: CT ABDOMEN AND PELVIS WITH CONTRAST TECHNIQUE: Multidetector CT imaging of the abdomen and pelvis was performed using the standard protocol following bolus administration of intravenous contrast. CONTRAST:  1075mISOVUE-300 IOPAMIDOL (ISOVUE-300) INJECTION 61% COMPARISON:  CT 09/15/2014,  07/30/2014, 05/26/2014, 05/06/2014, 02/20/2014 FINDINGS: Lower chest:  Lung bases demonstrate no acute consolidation or effusion. Normal heart size. Hepatobiliary: No focal liver abnormality is seen. No gallstones, gallbladder wall thickening, or biliary dilatation. Pancreas: Unremarkable. No pancreatic ductal dilatation or surrounding inflammatory changes. Spleen: Normal in size without focal abnormality. Adrenals/Urinary Tract: Adrenal glands are within normal limits. Multiple cysts in the right kidney. Subcentimeter hypodensity mid left kidney too small to further characterize. No hydronephrosis. The bladder is normal Stomach/Bowel: Right hemicolectomy changes with small bowel colon anastomosis in the upper abdomen, at the midline. Diffuse fluid within the residual colon. Focal area of narrowing within the rectosigmoid colon, series 2, image number 66. There is mild dilatation of the small bowel upstream of the anastomosis measuring up to 4.8 cm. No definite small bowel thickening. Remainder of the small bowel is collapsed. The stomach is within normal limits. Vascular/Lymphatic: Nonaneurysmal aorta. No significantly enlarged lymph nodes. Reproductive: Slightly enlarged prostate with coarse calcification Other: No free air or free fluid. Fat in the inguinal canals bilaterally. Musculoskeletal: Stable lucent lesion in L2. No acute or suspicious lesion. Mild retrolisthesis of L4 on L5. IMPRESSION: 1. Kidneys show no hydronephrosis or acute abnormality. 2. Status post right hemi colectomy with small bowel colon anastomosis noted within the anterior abdomen at the midline. The segment of small bowel immediately upstream to the anastomosis is slightly dilated and fluid-filled. The remainder of the small bowel is unremarkable. There is a focal area of narrowing and soft tissue thickening in the rectosigmoid colon which may be secondary to spasm, stricture, or focal area of acute disease although not much surrounding inflammation. Electronically Signed   By: Donavan Foil M.D.   On:  06/22/2017 21:42     Microbiology: Recent Results (from the past 240 hour(s))  C difficile quick scan w PCR reflex     Status: None   Collection Time: 07/02/17  2:50 AM  Result Value Ref Range Status   C Diff antigen NEGATIVE NEGATIVE Final   C Diff toxin NEGATIVE NEGATIVE Final   C Diff interpretation No C. difficile detected.  Final    Comment: VALID Performed at [facility], [address] [place], [place] [address]   Gastrointestinal Panel by PCR , Stool     Status: None   Collection Time: 07/02/17  2:50 AM  Result Value Ref Range Status   Campylobacter species NOT DETECTED NOT DETECTED Final   Plesimonas shigelloides NOT DETECTED NOT DETECTED Final   Salmonella species NOT DETECTED NOT DETECTED Final   Yersinia enterocolitica NOT DETECTED NOT DETECTED Final   Vibrio species NOT DETECTED NOT DETECTED Final   Vibrio cholerae NOT DETECTED NOT DETECTED Final   Enteroaggregative E coli (EAEC) NOT DETECTED NOT DETECTED Final   Enteropathogenic E coli (EPEC) NOT DETECTED NOT DETECTED Final   Enterotoxigenic E coli (ETEC) NOT DETECTED NOT DETECTED Final   Shiga like toxin producing E coli (STEC) NOT DETECTED NOT DETECTED Final   Shigella/Enteroinvasive E coli (EIEC) NOT DETECTED NOT DETECTED Final   Cryptosporidium NOT DETECTED NOT DETECTED Final   Cyclospora cayetanensis NOT DETECTED NOT DETECTED Final   Entamoeba histolytica NOT DETECTED NOT DETECTED Final   Giardia lamblia NOT DETECTED NOT DETECTED Final   Adenovirus F40/41 NOT DETECTED NOT DETECTED Final   Astrovirus NOT DETECTED NOT DETECTED Final   Norovirus GI/GII NOT DETECTED NOT DETECTED Final   Rotavirus A NOT DETECTED NOT DETECTED Final   Sapovirus (I, II, IV, and V) NOT DETECTED NOT DETECTED  Final    Comment: Performed at [facility], [address] [place], [place] [address]     Labs: Basic Metabolic Panel: Recent Labs  Lab 07/01/17 1535 07/02/17 0328 07/03/17 0429  NA 140 135 137  K 4.7 4.9 4.6   CL 109 106 106  CO2 22 21* 23  GLUCOSE 98 103* 125*  BUN 31* 25* 16  CREATININE 1.28* 1.24 1.06  CALCIUM 9.1 8.7* 8.9  MG  --   --  1.8   Liver Function Tests: Recent Labs  Lab 07/01/17 1535 07/02/17 0328  AST 21 18  ALT 30 27  ALKPHOS 35* 34*  BILITOT 0.3 0.1*  PROT 6.7 6.5  ALBUMIN 3.3* 3.2*   Recent Labs  Lab 07/01/17 1613  LIPASE 48   No results for input(s): AMMONIA in the last 168 hours. CBC: Recent Labs  Lab 07/01/17 1535 07/01/17 2045 07/02/17 0328 07/02/17 0839 07/02/17 1456 07/03/17 0429  WBC 9.8  --  6.6  --   --  7.7  NEUTROABS 7.7  --   --   --   --   --   HGB 8.4* 8.0* 8.4* 8.9* 8.6* 8.1*  HCT 28.6* 27.6* 28.7* 30.6* 29.3* 28.1*  MCV 69.2*  --  69.5*  --   --  68.9*  PLT 479*  --  460*  --   --  492*   Cardiac Enzymes: No results for input(s): CKTOTAL, CKMB, CKMBINDEX, TROPONINI in the last 168 hours. BNP: Invalid input(s): POCBNP CBG: Recent Labs  Lab 07/02/17 0748  GLUCAP 109*    Time coordinating discharge:  Greater than 30 minutes  Signed:  Orson Eva, DO [facility] Pager: [phone] 07/03/2017, 3:57 PM

## 2017-07-03 NOTE — Progress Notes (Signed)
Patient states understanding of discharge instructions, prescriptions given 

## 2017-07-03 NOTE — Telephone Encounter (Signed)
To [facility] clinical pool: Currently inpatient at [facility] but will be discharged soon. Please refer to [facility] for evaluation, known to them from 2015. Complicated past medical history with small bowel Crohn's disease undergoing multiple resections and failing multiple agents. Needs expedited appt if at all possible.

## 2017-07-03 NOTE — Care Management Note (Signed)
Case Management Note  Patient Details  Name: Walter Diaz MRN: [id number] Date of Birth: [birth year]  Subjective/Objective:                 Admitted with crohns flare, from [facility], has insurance but no PCP. Of note. Pt was under the impression he had Medicaid, however when admissions attempted to verify they determined it was inactive. Pt says he does have drug coverage for Rx's at DC. Requesing information about financial assistance with paying bill.    Action/Plan: DC home today. CM provided list of PCP accepting new pt's. Pt will be referred to [facility] by GI and neuro will f/u after DC. CM has made referral to [facility] per pt's request. No other CM needs noted at this time.   Expected Discharge Date:      07/04/17            Expected Discharge Plan:  Home/Self Care  In-House Referral:  NA  Discharge planning Services  CM Consult  Post Acute Care Choice:  NA Choice offered to:  NA  Status of Service:  Completed, signed off  Sherald Barge, RN 07/03/2017, 10:53 AM

## 2017-07-03 NOTE — Telephone Encounter (Signed)
ASAP referral faxed to [place].

## 2017-07-03 NOTE — Progress Notes (Signed)
Subjective: Small yellow loose  BM this morning "could fill a dixie cup". Feels much better with diet advanced to soft foods. Tolerated breakfast. Coloring pictures and no distress. Interested in going home. Still receiving IV medication through port.   Objective: Vital signs in last 24 hours: Temp:  [98.1 F (36.7 C)-98.7 F (37.1 C)] 98.1 F (36.7 C) (03/05 0453) Pulse Rate:  [64-100] 64 (03/05 0453) Resp:  [18] 18 (03/04 2027) BP: (119-160)/(86-89) 130/89 (03/05 0453) SpO2:  [98 %-100 %] 100 % (03/05 0453) Weight:  [179 lb 14.3 oz (81.6 kg)] 179 lb 14.3 oz (81.6 kg) (03/05 0453) Last BM Date: 07/02/17 General:   Alert and oriented, pleasant Head:  Normocephalic and atraumatic. Eyes:  No icterus, sclera clear. Conjuctiva pink.  Abdomen:  Bowel sounds present, soft, non-tender, non-distended. No HSM or hernias noted. No rebound or guarding. No masses appreciated  Msk:  Symmetrical without gross deformities. Normal posture. Extremities:  Without edema. Neurologic:  Alert and  oriented x4 Psych:  Alert and cooperative. Normal mood and affect.  Intake/Output from previous day: 03/04 0701 - 03/05 0700 In: 2072.5 [P.O.:1080; I.V.:992.5] Out: 1550 [Urine:1550] Intake/Output this shift: No intake/output data recorded.  Lab Results: Recent Labs    07/01/17 1535  07/02/17 0328 07/02/17 0839 07/02/17 1456 07/03/17 0429  WBC 9.8  --  6.6  --   --  7.7  HGB 8.4*   < > 8.4* 8.9* 8.6* 8.1*  HCT 28.6*   < > 28.7* 30.6* 29.3* 28.1*  PLT 479*  --  460*  --   --  492*   < > = values in this interval not displayed.   Lab Results  Component Value Date   IRON 17 (L) 07/02/2017   TIBC 519 (H) 07/02/2017   FERRITIN 7 (L) 07/02/2017      BMET Recent Labs    07/01/17 1535 07/02/17 0328 07/03/17 0429  NA 140 135 137  K 4.7 4.9 4.6  CL 109 106 106  CO2 22 21* 23  GLUCOSE 98 103* 125*  BUN 31* 25* 16  CREATININE 1.28* 1.24 1.06  CALCIUM 9.1 8.7* 8.9   LFT Recent Labs     07/01/17 1535 07/02/17 0328  PROT 6.7 6.5  ALBUMIN 3.3* 3.2*  AST 21 18  ALT 30 27  ALKPHOS 35* 34*  BILITOT 0.3 0.1*     Studies/Results: Mr Jeri Cos Wo Contrast  Result Date: 07/02/2017 CLINICAL DATA:  Multiple sclerosis EXAM: MRI HEAD WITHOUT AND WITH CONTRAST TECHNIQUE: Multiplanar, multiecho pulse sequences of the brain and surrounding structures were obtained without and with intravenous contrast. CONTRAST:  35mL MULTIHANCE GADOBENATE DIMEGLUMINE 529 MG/ML IV SOLN COMPARISON:  CT 05/06/2014, MRI 05/07/2013 FINDINGS: Brain: Image quality degraded by motion. The patient was claustrophobic and motion was progressive throughout the study. Ventricle size normal. Cerebral volume normal. Punctate hyperintensities in the subcortical white matter bilaterally best seen on the prior study which is of better quality. These are obscured by motion artifact today. No new white matter lesions. Brainstem and cerebellum and basal ganglia normal. Negative for acute infarct. Negative for hemorrhage or mass. Cerebellar tonsils 4 mm below the foramen magnum, this is the upper limits of normal. Normal enhancement following contrast infusion. Vascular: Normal arterial flow voids Skull and upper cervical spine: Negative Sinuses/Orbits: Extensive mucosal edema throughout the paranasal sinuses with progression from the prior study. Normal orbit. Mastoid sinus clear bilaterally. Other: None IMPRESSION: Image quality degraded by motion. This obscures the small  white matter lesion seen previously. These are not typical for multiple sclerosis. No new lesions and no acute abnormality. Extensive mucosal edema paranasal sinuses with progression from the prior study Cerebellar tonsils low lying but without definite Chiari malformation. Electronically Signed   By: Franchot Gallo M.D.   On: 07/02/2017 12:09   Ct Abdomen Pelvis W Contrast  Result Date: 07/01/2017 CLINICAL DATA:  Bloody stool history of Crohn's EXAM: CT ABDOMEN  AND PELVIS WITH CONTRAST TECHNIQUE: Multidetector CT imaging of the abdomen and pelvis was performed using the standard protocol following bolus administration of intravenous contrast. CONTRAST:  179mL ISOVUE-300 IOPAMIDOL (ISOVUE-300) INJECTION 61% COMPARISON:  07/30/2014, 05/26/2014, 05/06/2014, 02/20/2014, 06/22/2017 FINDINGS: Lower chest: Patchy dependent atelectasis at the lung bases. No consolidation pleural effusion. Heart size within normal limits. Hepatobiliary: No focal hepatic abnormality. Similar linear calcification or density along the diaphragmatic surface of the liver. Contracted gallbladder with possible mild wall thickening. No biliary dilatation. Pancreas: Unremarkable. No pancreatic ductal dilatation or surrounding inflammatory changes. Spleen: Normal in size without focal abnormality. Adrenals/Urinary Tract: Adrenal glands are within normal limits. Kidneys show no hydronephrosis. Cyst in the midpole of the right kidney. The bladder is unremarkable Stomach/Bowel: The stomach is nonenlarged. Status post right hemicolectomy changes with small bowel colon anastomosis in the anterior midline abdomen. Slightly dilated segment of small bowel up to the anastomosis but similar compared to prior and no bowel wall thickening in the region. Possible mild small bowel wall thickening in the left lower quadrant of the abdomen but lack of enteral contrast precludes further evaluation. Remainder of the colon shows no inflammatory changes. Vascular/Lymphatic: Nonaneurysmal aorta. Nonspecific subcentimeter periaortic lymph nodes. Reproductive: Prostate gland calcifications. Slightly enlarged prostate. Other: Negative for free air or free fluid. Bilateral fat filled inguinal hernias. No free air or free fluid. Surgical changes along the ventral abdominal wall. Musculoskeletal: Trace retrolisthesis of L5 on S1. No acute or suspicious lesion. Stable possible hemangioma in L2. IMPRESSION: 1. Possible mild bowel wall  thickening/area of active disease within left lower quadrant small bowel loops but without much surrounding inflammation, further evaluation limited without enteral contrast. 2. Otherwise no definite CT evidence for acute intra-abdominal or pelvic abnormality. Postsurgical changes compatible with prior right hemicolectomy and small bowel colon anastomosis within the anterior abdomen near the midline. 3. Contracted gallbladder with possible mild wall thickening. Correlation with ultrasound if symptomatology suggests gallbladder disease. Electronically Signed   By: Donavan Foil M.D.   On: 07/01/2017 18:24    Assessment: 52 year old male with history of small bowel Crohn's disease originally diagnosed in 1988 and undergoing multiple small bowel resections, prior exploratory laparotomy with lysis of adhesions and ileostomy takedown with ileocolonic resection and primary anastomosis in Aug 2017. Has failed multiple agents as noted in consult note dated 07/02/17, and unfortunately he only received one induction dose of Stelara in [facility] Apr 06, 2017. Prior to this he was on Enytvio. Recent colonoscopy on file with moderately active Crohn's disease of distal ileum, path unknown. He is clinically improved since admission. CDI and GI pathogen panel negative. Will be best served at a tertiary care facility for management, and he was established at [address] in 2015 prior to moving to [place]. Will expedite an outpatient appointment for him.    Anemia: chronic. EGD/colonoscopy on file. History of multiple small bowel resections. Will need outpatient follow-up with Hematology due to likely need for iron infusions serially. Notably with IDA. Multifactorial in setting of Crohn's disease, malabsorption.   Discussed avoidance of high  doses of aspirin: he is taking multiple doses of this daily.   Plan: Stop Solu-medrol and change to prednisone 40 mg daily. Will need 2 weeks prednisone followed by 30 mg X 1 week, 20 mg X  1 week, then 10 mg X 1 week. Unknown if entocort would be covered by his insurance Change PPI IV to oral Recommend discontinuing IV narcotics and changing to oral Avoidance of NSAIDs Will request expedited outpatient appointment at [facility] for further evaluation Hopeful discharge within 24 hours   Annitta Needs, PhD, ANP-BC [facility]       LOS: 1 day    07/03/2017, 8:18 AM

## 2017-07-09 ENCOUNTER — Other Ambulatory Visit: Payer: Self-pay

## 2017-07-09 ENCOUNTER — Emergency Department (HOSPITAL_COMMUNITY)
Admission: EM | Admit: 2017-07-09 | Discharge: 2017-07-09 | Disposition: A | Discharge status: Home / Self Care | Payer: Medicare Other | Attending: Emergency Medicine | Admitting: Emergency Medicine

## 2017-07-09 ENCOUNTER — Emergency Department (HOSPITAL_COMMUNITY): Payer: Medicare Other

## 2017-07-09 ENCOUNTER — Encounter (HOSPITAL_COMMUNITY): Payer: Self-pay | Admitting: Emergency Medicine

## 2017-07-09 DIAGNOSIS — Y929 Unspecified place or not applicable: Secondary | ICD-10-CM | POA: Diagnosis not present

## 2017-07-09 DIAGNOSIS — S20212A Contusion of left front wall of thorax, initial encounter: Secondary | ICD-10-CM | POA: Diagnosis not present

## 2017-07-09 DIAGNOSIS — Y939 Activity, unspecified: Secondary | ICD-10-CM | POA: Diagnosis not present

## 2017-07-09 DIAGNOSIS — J45909 Unspecified asthma, uncomplicated: Secondary | ICD-10-CM | POA: Diagnosis not present

## 2017-07-09 DIAGNOSIS — I130 Hypertensive heart and chronic kidney disease with heart failure and stage 1 through stage 4 chronic kidney disease, or unspecified chronic kidney disease: Secondary | ICD-10-CM | POA: Insufficient documentation

## 2017-07-09 DIAGNOSIS — W11XXXA Fall on and from ladder, initial encounter: Secondary | ICD-10-CM | POA: Insufficient documentation

## 2017-07-09 DIAGNOSIS — S4992XA Unspecified injury of left shoulder and upper arm, initial encounter: Secondary | ICD-10-CM | POA: Diagnosis present

## 2017-07-09 DIAGNOSIS — E1122 Type 2 diabetes mellitus with diabetic chronic kidney disease: Secondary | ICD-10-CM | POA: Diagnosis not present

## 2017-07-09 DIAGNOSIS — Z79899 Other long term (current) drug therapy: Secondary | ICD-10-CM | POA: Insufficient documentation

## 2017-07-09 DIAGNOSIS — I509 Heart failure, unspecified: Secondary | ICD-10-CM | POA: Insufficient documentation

## 2017-07-09 DIAGNOSIS — S40012A Contusion of left shoulder, initial encounter: Secondary | ICD-10-CM | POA: Diagnosis not present

## 2017-07-09 DIAGNOSIS — F17220 Nicotine dependence, chewing tobacco, uncomplicated: Secondary | ICD-10-CM | POA: Insufficient documentation

## 2017-07-09 DIAGNOSIS — Y999 Unspecified external cause status: Secondary | ICD-10-CM | POA: Insufficient documentation

## 2017-07-09 DIAGNOSIS — N189 Chronic kidney disease, unspecified: Secondary | ICD-10-CM | POA: Insufficient documentation

## 2017-07-09 LAB — BASIC METABOLIC PANEL
ANION GAP: 9 (ref 5–15)
BUN: 32 mg/dL — AB (ref 6–20)
CALCIUM: 8.6 mg/dL — AB (ref 8.9–10.3)
CO2: 20 mmol/L — ABNORMAL LOW (ref 22–32)
Chloride: 107 mmol/L (ref 101–111)
Creatinine, Ser: 1.42 mg/dL — ABNORMAL HIGH (ref 0.61–1.24)
GFR calc Af Amer: >60 mL/min (ref >60)
GFR, EST NON AFRICAN AMERICAN: 56 mL/min — AB (ref >60)
Glucose, Bld: 87 mg/dL (ref 65–99)
Potassium: 3.9 mmol/L (ref 3.5–5.1)
SODIUM: 136 mmol/L (ref 135–145)

## 2017-07-09 LAB — CBC WITH DIFFERENTIAL/PLATELET
BASOS PCT: 0 %
Basophils Absolute: 0 10*3/uL (ref 0.0–0.1)
EOS ABS: 0 10*3/uL (ref 0.0–0.7)
Eosinophils Relative: 0 %
HCT: 28 % — ABNORMAL LOW (ref 39.0–52.0)
Hemoglobin: 8 g/dL — ABNORMAL LOW (ref 13.0–17.0)
Lymphocytes Relative: 13 %
Lymphs Abs: 1.6 10*3/uL (ref 0.7–4.0)
MCH: 20 pg — AB (ref 26.0–34.0)
MCHC: 28.6 g/dL — ABNORMAL LOW (ref 30.0–36.0)
MCV: 70 fL — ABNORMAL LOW (ref 78.0–100.0)
Monocytes Absolute: 0.9 10*3/uL (ref 0.1–1.0)
Monocytes Relative: 7 %
Neutro Abs: 9.9 10*3/uL — ABNORMAL HIGH (ref 1.7–7.7)
Neutrophils Relative %: 80 %
PLATELETS: 501 10*3/uL — AB (ref 150–400)
RBC: 4 MIL/uL — AB (ref 4.22–5.81)
RDW: 19.4 % — ABNORMAL HIGH (ref 11.5–15.5)
WBC: 12.4 10*3/uL — AB (ref 4.0–10.5)

## 2017-07-09 MED ORDER — HYDROMORPHONE HCL 1 MG/ML IJ SOLN
1.0000 mg | Freq: Once | INTRAMUSCULAR | Status: AC
  Administered 2017-07-09: 1 mg via INTRAVENOUS
  Filled 2017-07-09: qty 1

## 2017-07-09 MED ORDER — HEPARIN SOD (PORK) LOCK FLUSH 100 UNIT/ML IV SOLN
INTRAVENOUS | Status: AC
  Filled 2017-07-09: qty 5

## 2017-07-09 NOTE — ED Provider Notes (Signed)
[facility] EMERGENCY DEPARTMENT Provider Note   CSN: [id number] Arrival date & time: 07/09/17  1715     History   Chief Complaint Chief Complaint  Patient presents with  . Fall    HPI Debbie Bellucci is a 52 y.o. male.  He has a history of multiple sclerosis and Crohn's disease.  He states a few hours ago he was on a ladder that slipped out and he struck his left side of his anterior chest and shoulder.  There was no LOC.  He feels like he actually hit straight on his Port-A-Cath site.  There is pain throughout the trapezius anterior and posterior shoulder.  It is not associated with any shortness of breath.  He does complain of chest pain but he feels it is more muscular.  There is no head neck abdominal pain he was not incontinent of urine or stool.  He has been ambulatory since the fall.  The history is provided by the patient.  Fall  This is a new problem. The current episode started 1 to 2 hours ago. The problem occurs constantly. The problem has not changed since onset.Associated symptoms include chest pain. Pertinent negatives include no abdominal pain, no headaches and no shortness of breath. The symptoms are aggravated by bending and twisting. The symptoms are relieved by rest. He has tried nothing for the symptoms. The treatment provided mild relief.    Past Medical History:  Diagnosis Date  . Anemia   . Arthritis   . Asthma   . Cancer ([place])   . CHF (congestive heart failure) ([place])   . Chronic kidney disease   . Crohn disease ([place])   . Diabetes mellitus without complication ([place])   . Hypertension   . Multiple sclerosis ([place])   . RA (rheumatoid arthritis) ([place])   . Sleep apnea     Patient Active Problem List   Diagnosis Date Noted  . Iron deficiency anemia due to chronic blood loss 07/03/2017  . Exacerbation of Crohn's disease ([place]) 07/02/2017  . Diplopia   . Crohn's disease of intestine with rectal bleeding ([place])   . Crohn's colitis ([place]) 07/01/2017  .  Multiple sclerosis ([place]) 07/01/2017  . Dehydration 07/01/2017  . Anemia 07/01/2017    Past Surgical History:  Procedure Laterality Date  . APPENDECTOMY    . COLONOSCOPY  03/2017   [place]: Moderately active Crohn's disease of distal ileum and ileocolonic anastomosis s/p biopsy, internal hemorrhoids  . ESOPHAGOGASTRODUODENOSCOPY  03/2017   [facility]: 1 cm hiatal hernia, irregular Z-line s/p biopsy, mild erosive gastritis, s/p biopsy. Path unavailable  . EXPLORATORY LAPAROTOMY  11/2015   [facility]: with lysis of adhesions and ileostomy takedown with ileocolonic resection and primary anastomosis   . HERNIA REPAIR    . HERNIA REPAIR  2009   [facility]   . right knee surgery    . SMALL BOWEL REPAIR    . small bowel resections     multiple       Home Medications    Prior to Admission medications   Medication Sig Start Date End Date Taking? Authorizing Provider  amphetamine-dextroamphetamine (ADDERALL) 10 MG tablet Take 10 mg by mouth 2 (two) times daily.    [provider]  cyanocobalamin (,VITAMIN B-12,) 1000 MCG/ML injection Inject into the muscle every 30 (thirty) days. 05/25/14   [provider]  ferrous sulfate 325 (65 FE) MG tablet Take 1 tablet (325 mg total) by mouth 2 (two) times daily with a meal. 07/03/17  Orson Eva, MD  Multiple Vitamin (MULTIVITAMIN WITH MINERALS) TABS tablet Take 1 tablet by mouth daily.    [provider]  ondansetron (ZOFRAN-ODT) 4 MG disintegrating tablet DISSOLVE 1 tablet ON THE TONGUE every 8 hours as needed for Nausea/Vomiting. 05/07/17   [provider]  oxyCODONE-acetaminophen (PERCOCET) 5-325 MG tablet Take 1-2 tablets by mouth every 4 (four) hours as needed for moderate pain or severe pain. 07/03/17   Orson Eva, MD  pantoprazole (PROTONIX) 40 MG tablet Take 40 mg by mouth daily. 03/21/17   [provider]  PARoxetine (PAXIL) 20 MG tablet Take 20 mg by mouth at bedtime. 05/22/17   [provider]  predniSONE (DELTASONE) 10 MG tablet Take 4 tablets (40 mg total) by mouth daily with breakfast. X 2 weeks.  Then 3 tablets (30 mg) daily x 7 days.  Then 2 tablets (20 mg) daily x 7 days.  Then 1 tablet (10 mg) daily x 7 days 07/04/17   Tat, Shanon Brow, MD  pregabalin (LYRICA) 75 MG capsule Take 75 mg by mouth 2 (two) times daily.    [provider]  ustekinumab (STELARA) 90 MG/ML SOSY injection Inject into the skin every 8 (eight) weeks. 11/28/16   [provider]    Family History Family History  Problem Relation Age of Onset  . Crohn's disease Mother   . Colon cancer Father        unknown    Social History Social History   Tobacco Use  . Smoking status: Former Smoker    Last attempt to quit: 06/30/2015    Years since quitting: 2.0  . Smokeless tobacco: Current User    Types: Chew  . Tobacco comment: 1 can every 2 months   Substance Use Topics  . Alcohol use: No    Comment: reformed alcohol X 12 years   . Drug use: Yes    Types: Marijuana    Comment: in remote past, "everything"      Allergies   Hctz [hydrochlorothiazide] and Morphine and related   Review of Systems Review of Systems  Constitutional: Negative for chills and fever.  HENT: Negative for ear pain and sore throat.   Eyes: Negative for pain and visual disturbance.  Respiratory: Negative for cough and shortness of breath.   Cardiovascular: Positive for chest pain. Negative for palpitations.  Gastrointestinal: Negative for abdominal pain and vomiting.  Genitourinary: Negative for dysuria and hematuria.  Musculoskeletal: Positive for arthralgias (l shoulder). Negative for back pain and neck pain.  Skin: Negative for color change and rash.  Neurological: Negative for seizures, syncope and headaches.  All other systems reviewed and are negative.    Physical Exam Updated Vital Signs BP 127/82 (BP Location: Left Arm)   Pulse 89   Temp 99 F (37.2 C) (Oral)   Resp 10   SpO2 100%     Physical Exam  Constitutional: He appears well-developed and well-nourished.  HENT:  Head: Normocephalic and atraumatic.  Eyes: Conjunctivae are normal.  Neck: Neck supple.  Cardiovascular: Normal rate and regular rhythm.  No murmur heard. Pulmonary/Chest: Effort normal and breath sounds normal. No respiratory distress.    Abdominal: Soft. There is no tenderness.  Musculoskeletal: He exhibits tenderness. He exhibits no edema.       Right shoulder: Normal.       Left shoulder: He exhibits tenderness, bony tenderness and pain. He exhibits no deformity, no laceration, no spasm and normal pulse.       Right elbow:  Normal.      Left elbow: Normal.       Right wrist: Normal.       Left wrist: Normal.       Right hip: Normal.       Left hip: Normal.       Right knee: Normal.       Left knee: Normal.       Right ankle: Normal.       Left ankle: Normal.  Neurological: He is alert.  Skin: Skin is warm and dry.  Psychiatric: He has a normal mood and affect.  Nursing note and vitals reviewed.    ED Treatments / Results  Labs (all labs ordered are listed, but only abnormal results are displayed) Labs Reviewed  CBC WITH DIFFERENTIAL/PLATELET - Abnormal; Notable for the following components:      Result Value   WBC 12.4 (*)    RBC 4.00 (*)    Hemoglobin 8.0 (*)    HCT 28.0 (*)    MCV 70.0 (*)    MCH 20.0 (*)    MCHC 28.6 (*)    RDW 19.4 (*)    Platelets 501 (*)    Neutro Abs 9.9 (*)    All other components within normal limits  BASIC METABOLIC PANEL - Abnormal; Notable for the following components:   CO2 20 (*)    BUN 32 (*)    Creatinine, Ser 1.42 (*)    Calcium 8.6 (*)    GFR calc non Af Amer 56 (*)    All other components within normal limits    EKG  EKG Interpretation  Date/Time:  Monday July 09 2017 17:20:46 EDT Ventricular Rate:  91 PR Interval:    QRS Duration: 83 QT Interval:  364 QTC Calculation: 448 R Axis:   65 Text Interpretation:  Sinus rhythm no  signnificant change from prior 4/05 Confirmed by Aletta Edouard [phone]) on 07/09/2017 5:48:23 PM       Radiology Dg Chest 2 View  Result Date: 07/09/2017 CLINICAL DATA:  Fall from 8 foot ladder with chest pain and shoulder pain, initial encounter EXAM: CHEST - 2 VIEW COMPARISON:  06/22/2017 FINDINGS: Left-sided chest wall port is noted and intact. Cardiac shadow is within normal limits. The lungs are clear. No sizable effusion or pneumothorax is noted. No acute bony abnormality is seen. IMPRESSION: No acute abnormality noted. Electronically Signed   By: Inez Catalina M.D.   On: 07/09/2017 18:17   Dg Shoulder Left  Result Date: 07/09/2017 CLINICAL DATA:  Fall from 8 foot ladder with shoulder pain, initial encounter EXAM: LEFT SHOULDER - 2+ VIEW COMPARISON:  05/06/2014 FINDINGS: There is no evidence of fracture or dislocation. There is no evidence of arthropathy or other focal bone abnormality. Soft tissues are unremarkable. Old rib fractures are noted on the left. IMPRESSION: No acute abnormality noted. Electronically Signed   By: Inez Catalina M.D.   On: 07/09/2017 18:23    Procedures Procedures (including critical care time)  Medications Ordered in ED Medications  HYDROmorphone (DILAUDID) injection 1 mg (not administered)     Initial Impression / Assessment and Plan / ED Course  I have reviewed the triage vital signs and the nursing notes.  Pertinent labs & imaging results that were available during my care of the patient were reviewed by me and considered in my medical decision making (see chart for details).  Clinical Course as of Jul 11 2130  Mon Jul 09, 2017  5053 Nurses were able  to access his port and got blood off it.  He got some pain medicine and feeling better.  His imaging was unremarkable.  I did send some lab work in case he needed a CT so I will check on that but likely he will be discharged.  [MB]    Clinical Course User Index [MB] Hayden Rasmussen, MD    Final  Clinical Impressions(s) / ED Diagnoses   Final diagnoses:  Contusion of left shoulder, initial encounter  Chest wall contusion, left, initial encounter    ED Discharge Orders    None       Hayden Rasmussen, MD 07/10/17 2133

## 2017-07-09 NOTE — ED Triage Notes (Signed)
Pt was working on ladder, slide down ladder hitting port a cath access device. Pt complaining of Chest pain and SOB. Vitals WNL per ems.

## 2017-07-09 NOTE — Telephone Encounter (Signed)
Called [facility] to f/u on referral. They contacted pt x 2 and mailed letter 07/06/17.

## 2017-07-09 NOTE — Discharge Instructions (Signed)
Your evaluated in the emergency department for a fall where he struck your left chest and shoulder.  Your x-rays did not show any obvious fracture and your lab work was about your baseline.  You should follow-up with your regular doctor and return to the emergency department if any worsening symptoms.

## 2017-07-13 ENCOUNTER — Emergency Department (HOSPITAL_COMMUNITY)
Admission: EM | Admit: 2017-07-13 | Discharge: 2017-07-14 | Disposition: A | Discharge status: Home / Self Care | Payer: Medicare Other | Attending: Emergency Medicine | Admitting: Emergency Medicine

## 2017-07-13 ENCOUNTER — Emergency Department (HOSPITAL_COMMUNITY): Payer: Medicare Other

## 2017-07-13 ENCOUNTER — Other Ambulatory Visit: Payer: Self-pay

## 2017-07-13 ENCOUNTER — Encounter (HOSPITAL_COMMUNITY): Payer: Self-pay | Admitting: Emergency Medicine

## 2017-07-13 DIAGNOSIS — Z87891 Personal history of nicotine dependence: Secondary | ICD-10-CM | POA: Insufficient documentation

## 2017-07-13 DIAGNOSIS — R1084 Generalized abdominal pain: Secondary | ICD-10-CM | POA: Diagnosis present

## 2017-07-13 DIAGNOSIS — D649 Anemia, unspecified: Secondary | ICD-10-CM | POA: Diagnosis not present

## 2017-07-13 DIAGNOSIS — I509 Heart failure, unspecified: Secondary | ICD-10-CM | POA: Insufficient documentation

## 2017-07-13 DIAGNOSIS — Z79899 Other long term (current) drug therapy: Secondary | ICD-10-CM | POA: Diagnosis not present

## 2017-07-13 DIAGNOSIS — Z859 Personal history of malignant neoplasm, unspecified: Secondary | ICD-10-CM | POA: Insufficient documentation

## 2017-07-13 DIAGNOSIS — N189 Chronic kidney disease, unspecified: Secondary | ICD-10-CM | POA: Diagnosis not present

## 2017-07-13 DIAGNOSIS — I130 Hypertensive heart and chronic kidney disease with heart failure and stage 1 through stage 4 chronic kidney disease, or unspecified chronic kidney disease: Secondary | ICD-10-CM | POA: Insufficient documentation

## 2017-07-13 DIAGNOSIS — K50111 Crohn's disease of large intestine with rectal bleeding: Secondary | ICD-10-CM | POA: Diagnosis not present

## 2017-07-13 DIAGNOSIS — J45909 Unspecified asthma, uncomplicated: Secondary | ICD-10-CM | POA: Insufficient documentation

## 2017-07-13 DIAGNOSIS — E1122 Type 2 diabetes mellitus with diabetic chronic kidney disease: Secondary | ICD-10-CM | POA: Diagnosis not present

## 2017-07-13 DIAGNOSIS — R197 Diarrhea, unspecified: Secondary | ICD-10-CM | POA: Insufficient documentation

## 2017-07-13 DIAGNOSIS — R109 Unspecified abdominal pain: Secondary | ICD-10-CM

## 2017-07-13 LAB — URINALYSIS, ROUTINE W REFLEX MICROSCOPIC
BILIRUBIN URINE: NEGATIVE
Glucose, UA: NEGATIVE mg/dL
HGB URINE DIPSTICK: NEGATIVE
KETONES UR: NEGATIVE mg/dL
LEUKOCYTES UA: NEGATIVE
Nitrite: NEGATIVE
PH: 5 (ref 5.0–8.0)
PROTEIN: NEGATIVE mg/dL
Specific Gravity, Urine: 1.023 (ref 1.005–1.030)

## 2017-07-13 LAB — COMPREHENSIVE METABOLIC PANEL
ALBUMIN: 2.8 g/dL — AB (ref 3.5–5.0)
ALK PHOS: 26 U/L — AB (ref 38–126)
ALT: 24 U/L (ref 17–63)
ANION GAP: 7 (ref 5–15)
AST: 21 U/L (ref 15–41)
BILIRUBIN TOTAL: 0.4 mg/dL (ref 0.3–1.2)
BUN: 23 mg/dL — ABNORMAL HIGH (ref 6–20)
CALCIUM: 8.2 mg/dL — AB (ref 8.9–10.3)
CO2: 21 mmol/L — ABNORMAL LOW (ref 22–32)
CREATININE: 1.15 mg/dL (ref 0.61–1.24)
Chloride: 109 mmol/L (ref 101–111)
GFR calc Af Amer: >60 mL/min (ref >60)
GFR calc non Af Amer: >60 mL/min (ref >60)
Glucose, Bld: 93 mg/dL (ref 65–99)
Potassium: 4.2 mmol/L (ref 3.5–5.1)
Sodium: 137 mmol/L (ref 135–145)
TOTAL PROTEIN: 5.2 g/dL — AB (ref 6.5–8.1)

## 2017-07-13 LAB — CBC WITH DIFFERENTIAL/PLATELET
BASOS ABS: 0 10*3/uL (ref 0.0–0.1)
Basophils Relative: 0 %
EOS PCT: 3 %
Eosinophils Absolute: 0.2 10*3/uL (ref 0.0–0.7)
HCT: 26.3 % — ABNORMAL LOW (ref 39.0–52.0)
HEMOGLOBIN: 7.5 g/dL — AB (ref 13.0–17.0)
LYMPHS ABS: 1.3 10*3/uL (ref 0.7–4.0)
Lymphocytes Relative: 20 %
MCH: 20.1 pg — ABNORMAL LOW (ref 26.0–34.0)
MCHC: 28.5 g/dL — ABNORMAL LOW (ref 30.0–36.0)
MCV: 70.3 fL — ABNORMAL LOW (ref 78.0–100.0)
Monocytes Absolute: 1.1 10*3/uL — ABNORMAL HIGH (ref 0.1–1.0)
Monocytes Relative: 17 %
NEUTROS ABS: 3.9 10*3/uL (ref 1.7–7.7)
NEUTROS PCT: 60 %
PLATELETS: 281 10*3/uL (ref 150–400)
RBC: 3.74 MIL/uL — AB (ref 4.22–5.81)
RDW: 19.8 % — ABNORMAL HIGH (ref 11.5–15.5)
WBC: 6.4 10*3/uL (ref 4.0–10.5)

## 2017-07-13 LAB — RAPID URINE DRUG SCREEN, HOSP PERFORMED
Amphetamines: POSITIVE — AB
BARBITURATES: NOT DETECTED
Benzodiazepines: NOT DETECTED
Cocaine: NOT DETECTED
Opiates: NOT DETECTED
Tetrahydrocannabinol: NOT DETECTED

## 2017-07-13 LAB — LIPASE, BLOOD: Lipase: 41 U/L (ref 11–51)

## 2017-07-13 MED ORDER — ONDANSETRON HCL 4 MG/2ML IJ SOLN
4.0000 mg | INTRAMUSCULAR | Status: DC | PRN
  Administered 2017-07-13: 4 mg via INTRAVENOUS

## 2017-07-13 MED ORDER — SODIUM CHLORIDE 0.9 % IV BOLUS (SEPSIS)
1000.0000 mL | Freq: Once | INTRAVENOUS | Status: AC
  Administered 2017-07-13: 1000 mL via INTRAVENOUS

## 2017-07-13 MED ORDER — FENTANYL CITRATE (PF) 100 MCG/2ML IJ SOLN
50.0000 ug | INTRAMUSCULAR | Status: AC | PRN
  Administered 2017-07-13 – 2017-07-14 (×2): 50 ug via INTRAVENOUS
  Filled 2017-07-13 (×2): qty 2

## 2017-07-13 MED ORDER — ONDANSETRON HCL 4 MG/2ML IJ SOLN
4.0000 mg | INTRAMUSCULAR | Status: DC | PRN
  Administered 2017-07-13: 4 mg via INTRAVENOUS
  Filled 2017-07-13 (×2): qty 2

## 2017-07-13 MED ORDER — SODIUM CHLORIDE 0.9 % IV SOLN
INTRAVENOUS | Status: DC
  Administered 2017-07-13 – 2017-07-14 (×2): via INTRAVENOUS

## 2017-07-13 MED ORDER — HYDROMORPHONE HCL 1 MG/ML IJ SOLN
1.0000 mg | INTRAMUSCULAR | Status: AC | PRN
  Administered 2017-07-13 – 2017-07-14 (×2): 1 mg via INTRAVENOUS
  Filled 2017-07-13 (×2): qty 1

## 2017-07-13 NOTE — ED Notes (Signed)
Not in [facility] when called 

## 2017-07-13 NOTE — ED Provider Notes (Signed)
[facility] EMERGENCY DEPARTMENT Provider Note   CSN: [id number] Arrival date & time: 07/13/17  1427     History   Chief Complaint Chief Complaint  Patient presents with  . Abdominal Pain    HPI Walter Diaz is a 52 y.o. male.   Abdominal Pain    Pt was seen at 1820.  Per pt, c/o gradual onset and persistence of constant generalized abd "pain" for the past 1 month.  Has been associated with nausea, multiple intermittent episodes of diarrhea/bloody stools, generalized weakness/fatigue and "no urine output for the past 2 days." Describes his symptoms as per his usual "Crohn's flair." Pt was evaluated in the ED earlier this month, tx tapering prednisone. Pt states he continues to take the prednisone "but it's not helping." Pt was evaluated by his GI MD at [facility] today, then sent to the ED for IVF and labs (see previous EDP note). Denies vomiting, no fevers, no back pain, no rash, no CP/SOB, no black stools, no hematuria, no testicular pain/swelling.        Past Medical History:  Diagnosis Date  . Anemia   . Arthritis   . Asthma   . Cancer ([place])   . CHF (congestive heart failure) ([place])   . Chronic kidney disease   . Crohn disease ([place])   . Diabetes mellitus without complication ([place])   . Hypertension   . Multiple sclerosis ([place])   . RA (rheumatoid arthritis) ([place])   . Sleep apnea     Patient Active Problem List   Diagnosis Date Noted  . Iron deficiency anemia due to chronic blood loss 07/03/2017  . Exacerbation of Crohn's disease ([place]) 07/02/2017  . Diplopia   . Crohn's disease of intestine with rectal bleeding ([place])   . Crohn's colitis ([place]) 07/01/2017  . Multiple sclerosis ([place]) 07/01/2017  . Dehydration 07/01/2017  . Anemia 07/01/2017    Past Surgical History:  Procedure Laterality Date  . APPENDECTOMY    . COLONOSCOPY  03/2017   [place]: Moderately active Crohn's disease of distal ileum and ileocolonic anastomosis s/p biopsy, internal hemorrhoids  .  ESOPHAGOGASTRODUODENOSCOPY  03/2017   [facility]: 1 cm hiatal hernia, irregular Z-line s/p biopsy, mild erosive gastritis, s/p biopsy. Path unavailable  . EXPLORATORY LAPAROTOMY  11/2015   [facility]: with lysis of adhesions and ileostomy takedown with ileocolonic resection and primary anastomosis   . HERNIA REPAIR    . HERNIA REPAIR  2009   [facility]   . right knee surgery    . SMALL BOWEL REPAIR    . small bowel resections     multiple       Home Medications    Prior to Admission medications   Medication Sig Start Date End Date Taking? Authorizing Provider  amphetamine-dextroamphetamine (ADDERALL) 10 MG tablet Take 10 mg by mouth 2 (two) times daily.    [provider]  cyanocobalamin (,VITAMIN B-12,) 1000 MCG/ML injection Inject into the muscle every 30 (thirty) days. 05/25/14   [provider]  ferrous sulfate 325 (65 FE) MG tablet Take 1 tablet (325 mg total) by mouth 2 (two) times daily with a meal. 07/03/17   Tat, Shanon Brow, MD  Multiple Vitamin (MULTIVITAMIN WITH MINERALS) TABS tablet Take 1 tablet by mouth daily.    [provider]  ondansetron (ZOFRAN-ODT) 4 MG disintegrating tablet DISSOLVE 1 tablet ON THE TONGUE every 8 hours as needed for Nausea/Vomiting. 05/07/17   [provider]  oxyCODONE-acetaminophen (PERCOCET) 5-325 MG tablet Take 1-2 tablets by  mouth every 4 (four) hours as needed for moderate pain or severe pain. Patient not taking: Reported on 07/09/2017 07/03/17   Orson Eva, MD  pantoprazole (PROTONIX) 40 MG tablet Take 40 mg by mouth daily. 03/21/17   [provider]  PARoxetine (PAXIL) 20 MG tablet Take 20 mg by mouth at bedtime. 05/22/17   [provider]  predniSONE (DELTASONE) 10 MG tablet Take 4 tablets (40 mg total) by mouth daily with breakfast. X 2 weeks.  Then 3 tablets (30 mg) daily x 7 days.  Then 2 tablets (20 mg) daily x 7 days.  Then 1 tablet (10 mg) daily x 7 days Patient taking differently: Take  10-40 mg by mouth See admin instructions. 40mg  dailyX 2 weeks.  Then 3 tablets (30 mg) daily x 7 days.  Then 2 tablets (20 mg) daily x 7 days.  Then 1 tablet (10 mg) daily x 7 days 07/04/17   Tat, Shanon Brow, MD  pregabalin (LYRICA) 75 MG capsule Take 75 mg by mouth 2 (two) times daily.    [provider]  ustekinumab (STELARA) 90 MG/ML SOSY injection Inject into the skin every 8 (eight) weeks. 11/28/16   [provider]    Family History Family History  Problem Relation Age of Onset  . Crohn's disease Mother   . Colon cancer Father        unknown    Social History Social History   Tobacco Use  . Smoking status: Former Smoker    Last attempt to quit: 06/30/2015    Years since quitting: 2.0  . Smokeless tobacco: Current User    Types: Chew  . Tobacco comment: 1 can every 2 months   Substance Use Topics  . Alcohol use: No    Comment: reformed alcohol X 12 years   . Drug use: Yes    Types: Marijuana    Comment: in remote past, "everything"      Allergies   Hctz [hydrochlorothiazide] and Morphine and related   Review of Systems Review of Systems  Gastrointestinal: Positive for abdominal pain.  ROS: Statement: All systems negative except as marked or noted in the HPI; Constitutional: Negative for fever and chills. +generalized weakness/fatigue.; ; Eyes: Negative for eye pain, redness and discharge. ; ; ENMT: Negative for ear pain, hoarseness, nasal congestion, sinus pressure and sore throat. ; ; Cardiovascular: Negative for chest pain, palpitations, diaphoresis, dyspnea and peripheral edema. ; ; Respiratory: Negative for cough, wheezing and stridor. ; ; Gastrointestinal: Negative for vomiting, hematemesis, jaundice and +blood in stool, diarrhea, abd pain, nausea.. ; ; Genitourinary: +"decreased urine output." Negative for dysuria, flank pain and hematuria. ; ; Genital:  No penile drainage or rash, no testicular pain or swelling, no scrotal rash or swelling. ;;  Musculoskeletal: Negative for back pain and neck pain. Negative for swelling and trauma.; ; Skin: Negative for pruritus, rash, abrasions, blisters, bruising and skin lesion.; ; Neuro: Negative for headache, lightheadedness and neck stiffness. Negative for altered level of consciousness, altered mental status, extremity weakness, paresthesias, involuntary movement, seizure and syncope.       Physical Exam Updated Vital Signs BP (!) 153/88 (BP Location: Right Arm)   Pulse 86   Temp 98.1 F (36.7 C) (Oral)   Resp 16   Ht 6' (1.829 m)   Wt 95.3 kg (210 lb)   SpO2 100%   BMI 28.48 kg/m    BP 121/83   Pulse 65   Temp 98.1 F (36.7 C) (Oral)  Resp 16   Ht 6' (1.829 m)   Wt 95.3 kg (210 lb)   SpO2 99%   BMI 28.48 kg/m    18:46 Orthostatic Vital Signs TH  Orthostatic Lying   BP- Lying: 136/82  Pulse- Lying: 77      Orthostatic Sitting  BP- Sitting: 132/77  Pulse- Sitting: 87      Orthostatic Standing at 0 minutes  BP- Standing at 0 minutes: 126/87  Pulse- Standing at 0 minutes: 88    Physical Exam 1825: Physical examination:  Nursing notes reviewed; Vital signs and O2 SAT reviewed;  Constitutional: Well developed, Well nourished, Uncomfortable appearing.; Head:  Normocephalic, atraumatic; Eyes: EOMI, PERRL, No scleral icterus; ENMT: Mouth and pharynx normal, Mucous membranes dry; Neck: Supple, Full range of motion, No lymphadenopathy; Cardiovascular: Regular rate and rhythm, No gallop; Respiratory: Breath sounds clear & equal bilaterally, No wheezes.  Speaking full sentences with ease, Normal respiratory effort/excursion; Chest: Nontender, Movement normal; Abdomen: Soft, +diffuse tenderness to palp, esp left side abd. Nondistended, Normal bowel sounds; Genitourinary: No CVA tenderness; Extremities: Peripheral pulses normal, No tenderness, No edema, No calf edema or asymmetry.; Neuro: AA&Ox3, Major CN grossly intact.  Speech clear. No gross focal motor or sensory deficits in  extremities.; Skin: Color normal, Warm, Dry.   ED Treatments / Results  Labs (all labs ordered are listed, but only abnormal results are displayed)   EKG  EKG Interpretation None       Radiology   Procedures Procedures (including critical care time)  Medications Ordered in ED Medications  ondansetron (ZOFRAN) injection 4 mg (4 mg Intravenous Given 07/13/17 1945)  fentaNYL (SUBLIMAZE) injection 50 mcg (50 mcg Intravenous Given 07/13/17 1945)  0.9 %  sodium chloride infusion (not administered)  ondansetron (ZOFRAN) injection 4 mg (not administered)  HYDROmorphone (DILAUDID) injection 1 mg (not administered)  sodium chloride 0.9 % bolus 1,000 mL (0 mLs Intravenous Stopped 07/13/17 2121)     Initial Impression / Assessment and Plan / ED Course  I have reviewed the triage vital signs and the nursing notes.  Pertinent labs & imaging results that were available during my care of the patient were reviewed by me and considered in my medical decision making (see chart for details).  MDM Reviewed: previous chart, nursing note and vitals Reviewed previous: labs and CT scan Interpretation: labs and CT scan   Results for orders placed or performed during the hospital encounter of 07/13/17  Lipase, blood  Result Value Ref Range   Lipase 41 11 - 51 U/L  Comprehensive metabolic panel  Result Value Ref Range   Sodium 137 135 - 145 mmol/L   Potassium 4.2 3.5 - 5.1 mmol/L   Chloride 109 101 - 111 mmol/L   CO2 21 (L) 22 - 32 mmol/L   Glucose, Bld 93 65 - 99 mg/dL   BUN 23 (H) 6 - 20 mg/dL   Creatinine, Ser 1.15 0.61 - 1.24 mg/dL   Calcium 8.2 (L) 8.9 - 10.3 mg/dL   Total Protein 5.2 (L) 6.5 - 8.1 g/dL   Albumin 2.8 (L) 3.5 - 5.0 g/dL   AST 21 15 - 41 U/L   ALT 24 17 - 63 U/L   Alkaline Phosphatase 26 (L) 38 - 126 U/L   Total Bilirubin 0.4 0.3 - 1.2 mg/dL   GFR calc non Af Amer >60 >60 mL/min   GFR calc Af Amer >60 >60 mL/min   Anion gap 7 5 - 15  Urinalysis, Routine w reflex  microscopic  Result Value Ref Range   Color, Urine YELLOW YELLOW   APPearance CLEAR CLEAR   Specific Gravity, Urine 1.023 1.005 - 1.030   pH 5.0 5.0 - 8.0   Glucose, UA NEGATIVE NEGATIVE mg/dL   Hgb urine dipstick NEGATIVE NEGATIVE   Bilirubin Urine NEGATIVE NEGATIVE   Ketones, ur NEGATIVE NEGATIVE mg/dL   Protein, ur NEGATIVE NEGATIVE mg/dL   Nitrite NEGATIVE NEGATIVE   Leukocytes, UA NEGATIVE NEGATIVE  Rapid urine drug screen (hospital performed)  Result Value Ref Range   Opiates NONE DETECTED NONE DETECTED   Cocaine NONE DETECTED NONE DETECTED   Benzodiazepines NONE DETECTED NONE DETECTED   Amphetamines POSITIVE (A) NONE DETECTED   Tetrahydrocannabinol NONE DETECTED NONE DETECTED   Barbiturates NONE DETECTED NONE DETECTED  CBC with Differential/Platelet  Result Value Ref Range   WBC 6.4 4.0 - 10.5 K/uL   RBC 3.74 (L) 4.22 - 5.81 MIL/uL   Hemoglobin 7.5 (L) 13.0 - 17.0 g/dL   HCT 26.3 (L) 39.0 - 52.0 %   MCV 70.3 (L) 78.0 - 100.0 fL   MCH 20.1 (L) 26.0 - 34.0 pg   MCHC 28.5 (L) 30.0 - 36.0 g/dL   RDW 19.8 (H) 11.5 - 15.5 %   Platelets 281 150 - 400 K/uL   Neutrophils Relative % 60 %   Neutro Abs 3.9 1.7 - 7.7 K/uL   Lymphocytes Relative 20 %   Lymphs Abs 1.3 0.7 - 4.0 K/uL   Monocytes Relative 17 %   Monocytes Absolute 1.1 (H) 0.1 - 1.0 K/uL   Eosinophils Relative 3 %   Eosinophils Absolute 0.2 0.0 - 0.7 K/uL   Basophils Relative 0 %   Basophils Absolute 0.0 0.0 - 0.1 K/uL   Ct Abdomen Pelvis Wo Contrast Result Date: 07/13/2017 CLINICAL DATA:  Abdominal pain.  Diarrhea.  Crohn disease. EXAM: CT ABDOMEN AND PELVIS WITHOUT CONTRAST TECHNIQUE: Multidetector CT imaging of the abdomen and pelvis was performed following the standard protocol without IV contrast. COMPARISON:  07/01/2017 CT abdomen/pelvis. FINDINGS: Lower chest: Stable tiny calcified left lung base granuloma. No acute abnormality at the lung bases. Hepatobiliary: Normal liver size. No liver mass. Stable  contracted gallbladder without definite gallbladder wall thickening. No radiopaque cholelithiasis. No biliary ductal dilatation. Pancreas: Normal, with no mass or duct dilation. Spleen: Normal size. No mass. Adrenals/Urinary Tract: Normal adrenals. No hydronephrosis. No renal stones. Simple right renal cysts, largest 2.8 cm in the upper right kidney. No additional contour deforming renal lesions. Normal nondistended bladder. Stomach/Bowel: Normal non-distended stomach. Patient is status post right hemicolectomy with ileocolic anastomosis in medial left abdomen. There are a few mildly dilated small bowel loops in the central abdomen with mild wall thickening and fluid levels, measuring up to 3.8 cm diameter. No focal small bowel caliber transition. There are fluid levels throughout the remnant large bowel, with no large bowel wall thickening. Vascular/Lymphatic: Normal caliber abdominal aorta. Mild central mesenteric adenopathy measuring up to the 1.1 cm (series 2/image 41), unchanged. No pelvic adenopathy. Reproductive: Stable mildly enlarged prostate with coarse nonspecific internal prostatic calcifications. Other: No pneumoperitoneum, ascites or focal fluid collection. Musculoskeletal: No aggressive appearing focal osseous lesions. Moderate lower lumbar spondylosis. IMPRESSION: 1. Mildly dilated and mildly thick walled central small bowel loops with air-fluid levels. No focal small bowel caliber transition. Findings are most suggestive of adynamic ileus due to a mild Crohn enteritis. No perforation, abscess or fistula. 2. Fluid levels throughout the remnant large-bowel, indicative of a nonspecific malabsorptive/diarrheal state. No large bowel wall  thickening. 3. Stable nonspecific mild central mesenteric adenopathy, probably reactive. Electronically Signed   By: Ilona Sorrel M.D.   On: 07/13/2017 19:14    Results for JHON, MALLOZZI (MRN [id number]) as of 07/13/2017 22:19  Ref. Range 07/02/2017 08:39 07/02/2017  14:56 07/03/2017 04:29 07/09/2017 18:10 07/13/2017 19:35  Hemoglobin Latest Ref Range: 13.0 - 17.0 g/dL 8.9 (L) 8.6 (L) 8.1 (L) 8.0 (L) 7.5 (L)  HCT Latest Ref Range: 39.0 - 52.0 % 30.6 (L) 29.3 (L) 28.1 (L) 28.0 (L) 26.3 (L)    2100:  H/H lower than previous. CT scan without acute SBO, fistula, abscess or perforation. Pt continues to c/o pain and nausea despite IV meds doses.  T/C returned from GI Dr. Gala Romney, case discussed, including:  HPI, pertinent PM/SHx, VS/PE, dx testing, ED course and treatment: pt has been evaluated at [facility] to his complex hx, Dr. Gala Romney requests to call [facility] for transfer/admit, as pt needs higher level of care/intervention for his Crohn's.   2205:  T/C returned from [facility] Dr. Samara Snide, case discussed, including:  HPI, pertinent PM/SHx, VS/PE, dx testing, ED course and treatment:  Agreeable to accept transfer/admit. Transfer Center will call back with bed assignment. Dx and testing, as well as d/w GI MD and [facility] Hospitalist, d/w pt.  Questions answered.  Verb understanding, agreeable to transfer/admit to [facility].       Final Clinical Impressions(s) / ED Diagnoses   Final diagnoses:  None    ED Discharge Orders    None        Francine Graven, DO 07/15/17 1731

## 2017-07-13 NOTE — ED Notes (Signed)
Pt returned to waiting room

## 2017-07-13 NOTE — ED Triage Notes (Signed)
Pt reports "just saw my doctor at [facility] and she sent me here- said there was something on the left side of my CT   When asked if doctor at [facility] had seen if she had recommended that he be admitted at [facility]  No response from pt

## 2017-07-13 NOTE — ED Triage Notes (Addendum)
Pt reports Crohn's flare up. Reports n/v, diarrhea with blood on and off for a month. Pt saw GI doctor today.

## 2017-07-13 NOTE — ED Provider Notes (Signed)
At today's visit, he admits to difficulty staying hydrated (carrying Gatorade bottle). He denies urination over past 2 days. Bowel movements are loose, ~ 15 times in 24 hour period. Therefore, I advised him to report to ER for IV fluids. He prefers to go to [facility] near his house.   He can have labs drawn during his ER visit including quantiferon gold, hepatitis B surface antigen and hepatitis B surface antibody. All questions were answered.        Noemi Chapel, MD 07/13/17 2223

## 2017-07-14 MED ORDER — FENTANYL CITRATE (PF) 100 MCG/2ML IJ SOLN: 50.0000 ug | INTRAMUSCULAR | Status: DC | PRN

## 2017-07-14 MED ORDER — HYDROMORPHONE HCL 1 MG/ML IJ SOLN
1.0000 mg | INTRAMUSCULAR | Status: AC | PRN
  Administered 2017-07-14 (×2): 1 mg via INTRAVENOUS
  Filled 2017-07-14 (×2): qty 1

## 2017-07-14 NOTE — ED Notes (Signed)
[facility] called earlier and gave [place] a number to call [facility] [phone] (an ALS team) to transfer Pt to [place].  Team should be he in about 1 1/2- 2 hours.  Nurse informed.  Demographics and notes faxed to [facility] as requested.

## 2017-07-14 NOTE — ED Notes (Signed)
Walter Diaz, transport here to transport Pt to [place].  [facility] is aware that we no longer need transport by them.

## 2017-07-15 LAB — HEPATITIS B SURFACE ANTIGEN: Hepatitis B Surface Ag: NEGATIVE

## 2017-07-15 LAB — HEPATITIS B SURFACE ANTIBODY,QUALITATIVE: Hep B S Ab: NONREACTIVE

## 2017-07-15 MED ORDER — PREGABALIN 75 MG PO CAPS
75.00 | ORAL_CAPSULE | ORAL | Status: DC
Start: 2017-07-16 — End: 2017-07-15

## 2017-07-15 MED ORDER — PREDNISONE 5 MG PO TABS
15.00 | ORAL_TABLET | ORAL | Status: DC
Start: 2017-07-16 — End: 2017-07-15

## 2017-07-15 MED ORDER — TRAZODONE HCL 50 MG PO TABS
25.00 | ORAL_TABLET | ORAL | Status: DC
Start: ? — End: 2017-07-15

## 2017-07-15 MED ORDER — TRAMADOL HCL 50 MG PO TABS
50.00 | ORAL_TABLET | ORAL | Status: DC
Start: ? — End: 2017-07-15

## 2017-07-15 MED ORDER — HYDROMORPHONE HCL 1 MG/ML IJ SOLN
.50 | INTRAMUSCULAR | Status: DC
Start: ? — End: 2017-07-15

## 2017-07-15 MED ORDER — GENERIC EXTERNAL MEDICATION
125.00 | Status: DC
Start: 2017-07-16 — End: 2017-07-15

## 2017-07-15 MED ORDER — AMPHETAMINE-DEXTROAMPHETAMINE 10 MG PO TABS
10.00 | ORAL_TABLET | ORAL | Status: DC
Start: 2017-07-16 — End: 2017-07-15

## 2017-07-15 MED ORDER — VANCOMYCIN HCL 50 MG/ML PO SOLR
125.00 | ORAL | Status: DC
Start: 2017-07-15 — End: 2017-07-15

## 2017-07-15 MED ORDER — GENERIC EXTERNAL MEDICATION
Status: DC
Start: ? — End: 2017-07-15

## 2017-07-15 MED ORDER — LIDOCAINE HCL 1 % IJ SOLN
0.50 | INTRAMUSCULAR | Status: DC
Start: ? — End: 2017-07-15

## 2017-07-15 MED ORDER — SODIUM CHLORIDE 0.9 % IJ SOLN
10.00 | INTRAMUSCULAR | Status: DC
Start: 2017-07-15 — End: 2017-07-15

## 2017-07-15 MED ORDER — SODIUM CHLORIDE 0.9 % IJ SOLN
20.00 | INTRAMUSCULAR | Status: DC
Start: ? — End: 2017-07-15

## 2017-07-15 MED ORDER — SODIUM CHLORIDE 0.9 % IV SOLN
INTRAVENOUS | Status: DC
Start: ? — End: 2017-07-15

## 2017-07-15 MED ORDER — SODIUM CHLORIDE 0.9 % IJ SOLN
10.00 | INTRAMUSCULAR | Status: DC
Start: ? — End: 2017-07-15

## 2017-07-15 MED ORDER — ACETAMINOPHEN 325 MG PO TABS
650.00 | ORAL_TABLET | ORAL | Status: DC
Start: ? — End: 2017-07-15

## 2017-07-19 LAB — QUANTIFERON-TB GOLD PLUS (RQFGPL)
QUANTIFERON TB2 AG VALUE: 0.05 [IU]/mL
QuantiFERON Mitogen Value: 1.57 IU/mL
QuantiFERON Nil Value: 0.05 IU/mL
QuantiFERON TB1 Ag Value: 0.05 IU/mL

## 2017-07-19 LAB — QUANTIFERON-TB GOLD PLUS: QUANTIFERON-TB GOLD PLUS: NEGATIVE

## 2017-08-08 ENCOUNTER — Emergency Department (HOSPITAL_COMMUNITY)
Admission: EM | Admit: 2017-08-08 | Discharge: 2017-08-08 | Disposition: A | Discharge status: Home / Self Care | Payer: Medicare HMO | Attending: Emergency Medicine | Admitting: Emergency Medicine

## 2017-08-08 ENCOUNTER — Encounter (HOSPITAL_COMMUNITY): Payer: Self-pay | Admitting: *Deleted

## 2017-08-08 ENCOUNTER — Emergency Department (HOSPITAL_COMMUNITY): Payer: Medicare HMO

## 2017-08-08 DIAGNOSIS — I130 Hypertensive heart and chronic kidney disease with heart failure and stage 1 through stage 4 chronic kidney disease, or unspecified chronic kidney disease: Secondary | ICD-10-CM | POA: Insufficient documentation

## 2017-08-08 DIAGNOSIS — R197 Diarrhea, unspecified: Secondary | ICD-10-CM | POA: Insufficient documentation

## 2017-08-08 DIAGNOSIS — F17220 Nicotine dependence, chewing tobacco, uncomplicated: Secondary | ICD-10-CM | POA: Insufficient documentation

## 2017-08-08 DIAGNOSIS — E1122 Type 2 diabetes mellitus with diabetic chronic kidney disease: Secondary | ICD-10-CM | POA: Insufficient documentation

## 2017-08-08 DIAGNOSIS — R1032 Left lower quadrant pain: Secondary | ICD-10-CM | POA: Diagnosis not present

## 2017-08-08 DIAGNOSIS — J45909 Unspecified asthma, uncomplicated: Secondary | ICD-10-CM | POA: Diagnosis not present

## 2017-08-08 DIAGNOSIS — N189 Chronic kidney disease, unspecified: Secondary | ICD-10-CM | POA: Diagnosis not present

## 2017-08-08 DIAGNOSIS — I509 Heart failure, unspecified: Secondary | ICD-10-CM | POA: Diagnosis not present

## 2017-08-08 LAB — LIPASE, BLOOD: LIPASE: 34 U/L (ref 11–51)

## 2017-08-08 LAB — URINALYSIS, ROUTINE W REFLEX MICROSCOPIC
Bilirubin Urine: NEGATIVE
Glucose, UA: 150 mg/dL — AB
Hgb urine dipstick: NEGATIVE
Ketones, ur: NEGATIVE mg/dL
LEUKOCYTES UA: NEGATIVE
NITRITE: NEGATIVE
Protein, ur: NEGATIVE mg/dL
SPECIFIC GRAVITY, URINE: 1.02 (ref 1.005–1.030)
pH: 6 (ref 5.0–8.0)

## 2017-08-08 LAB — COMPREHENSIVE METABOLIC PANEL
ALT: 23 U/L (ref 17–63)
AST: 22 U/L (ref 15–41)
Albumin: 3.2 g/dL — ABNORMAL LOW (ref 3.5–5.0)
Alkaline Phosphatase: 28 U/L — ABNORMAL LOW (ref 38–126)
Anion gap: 8 (ref 5–15)
BUN: 28 mg/dL — AB (ref 6–20)
CHLORIDE: 108 mmol/L (ref 101–111)
CO2: 21 mmol/L — AB (ref 22–32)
Calcium: 8.4 mg/dL — ABNORMAL LOW (ref 8.9–10.3)
Creatinine, Ser: 1.39 mg/dL — ABNORMAL HIGH (ref 0.61–1.24)
GFR calc Af Amer: >60 mL/min (ref >60)
GFR, EST NON AFRICAN AMERICAN: 57 mL/min — AB (ref >60)
Glucose, Bld: 152 mg/dL — ABNORMAL HIGH (ref 65–99)
POTASSIUM: 4.8 mmol/L (ref 3.5–5.1)
SODIUM: 137 mmol/L (ref 135–145)
Total Bilirubin: 0.3 mg/dL (ref 0.3–1.2)
Total Protein: 6 g/dL — ABNORMAL LOW (ref 6.5–8.1)

## 2017-08-08 LAB — CBC WITH DIFFERENTIAL/PLATELET
Basophils Absolute: 0 10*3/uL (ref 0.0–0.1)
Basophils Relative: 0 %
EOS PCT: 0 %
Eosinophils Absolute: 0 10*3/uL (ref 0.0–0.7)
HCT: 34.7 % — ABNORMAL LOW (ref 39.0–52.0)
HEMOGLOBIN: 10.5 g/dL — AB (ref 13.0–17.0)
LYMPHS PCT: 6 %
Lymphs Abs: 0.6 10*3/uL — ABNORMAL LOW (ref 0.7–4.0)
MCH: 25.2 pg — ABNORMAL LOW (ref 26.0–34.0)
MCHC: 30.3 g/dL (ref 30.0–36.0)
MCV: 83.4 fL (ref 78.0–100.0)
MONOS PCT: 3 %
Monocytes Absolute: 0.3 10*3/uL (ref 0.1–1.0)
Neutro Abs: 9.3 10*3/uL — ABNORMAL HIGH (ref 1.7–7.7)
Neutrophils Relative %: 91 %
Platelets: 342 10*3/uL (ref 150–400)
RBC: 4.16 MIL/uL — AB (ref 4.22–5.81)
WBC: 10.2 10*3/uL (ref 4.0–10.5)

## 2017-08-08 LAB — C DIFFICILE QUICK SCREEN W PCR REFLEX
C Diff antigen: NEGATIVE
C Diff interpretation: NOT DETECTED
C Diff toxin: NEGATIVE

## 2017-08-08 MED ORDER — METOCLOPRAMIDE HCL 10 MG PO TABS: 10.0000 mg | ORAL_TABLET | Freq: Four times a day (QID) | ORAL | 0 refills | Status: AC

## 2017-08-08 MED ORDER — SODIUM CHLORIDE 0.9 % IV BOLUS
1000.0000 mL | Freq: Once | INTRAVENOUS | Status: AC
  Administered 2017-08-08: 1000 mL via INTRAVENOUS

## 2017-08-08 MED ORDER — LOPERAMIDE HCL 2 MG PO CAPS
2.0000 mg | ORAL_CAPSULE | Freq: Four times a day (QID) | ORAL | 0 refills | Status: AC | PRN
Start: 2017-08-08 — End: ?

## 2017-08-08 MED ORDER — HYDROCODONE-ACETAMINOPHEN 5-325 MG PO TABS: 1.0000 | ORAL_TABLET | Freq: Four times a day (QID) | ORAL | 0 refills | Status: AC | PRN

## 2017-08-08 MED ORDER — METOCLOPRAMIDE HCL 5 MG/ML IJ SOLN
10.0000 mg | Freq: Once | INTRAMUSCULAR | Status: AC
  Administered 2017-08-08: 10 mg via INTRAVENOUS
  Filled 2017-08-08: qty 2

## 2017-08-08 MED ORDER — IOPAMIDOL (ISOVUE-300) INJECTION 61%
100.0000 mL | Freq: Once | INTRAVENOUS | Status: AC | PRN
  Administered 2017-08-08: 100 mL via INTRAVENOUS

## 2017-08-08 MED ORDER — FENTANYL CITRATE (PF) 100 MCG/2ML IJ SOLN
100.0000 ug | Freq: Once | INTRAMUSCULAR | Status: AC
  Administered 2017-08-08: 100 ug via INTRAVENOUS
  Filled 2017-08-08: qty 2

## 2017-08-08 NOTE — ED Provider Notes (Signed)
Emergency Department Provider Note   I have reviewed the triage vital signs and the nursing notes.   HISTORY  Chief Complaint Abdominal Pain   HPI Walter Diaz is a 52 y.o. male with PMH of Crohn's disease s/p bowel resection x 5, C. Diff (recently completing Vanc course), DM, RA, HTN, followed by GI at [place] (Dr. Emelda Fear) presents to the emergency department for evaluation of continued left lower quadrant pain with worsening watery diarrhea and nausea.  Patient states that his stools during the vancomycin treatment were a "pudding" consistency but 4 days ago abruptly became watery and copious. His LLQ abdominal pain continues and has worsened slightly. Denies any fever/chills. He completed his Vancomycin course on Monday (2 days prior). He sent a second stool culture off to [facility] in preparation for starting Ustekinumab. No blood in the emesis or stool. He reports generalized weakness, fatigue, and decreased urine output over the last 3 days.   Past Medical History:  Diagnosis Date  . Anemia   . Arthritis   . Asthma   . Cancer ([place])   . CHF (congestive heart failure) ([place])   . Chronic kidney disease   . Crohn disease ([place])   . Diabetes mellitus without complication ([place])   . Hypertension   . Multiple sclerosis ([place])   . RA (rheumatoid arthritis) ([place])   . Sleep apnea     Patient Active Problem List   Diagnosis Date Noted  . Iron deficiency anemia due to chronic blood loss 07/03/2017  . Exacerbation of Crohn's disease ([place]) 07/02/2017  . Diplopia   . Crohn's disease of intestine with rectal bleeding ([place])   . Crohn's colitis ([place]) 07/01/2017  . Multiple sclerosis ([place]) 07/01/2017  . Dehydration 07/01/2017  . Anemia 07/01/2017    Past Surgical History:  Procedure Laterality Date  . APPENDECTOMY    . COLONOSCOPY  03/2017   [place]: Moderately active Crohn's disease of distal ileum and ileocolonic anastomosis s/p biopsy, internal hemorrhoids  .  ESOPHAGOGASTRODUODENOSCOPY  03/2017   [facility]: 1 cm hiatal hernia, irregular Z-line s/p biopsy, mild erosive gastritis, s/p biopsy. Path unavailable  . EXPLORATORY LAPAROTOMY  11/2015   [facility]: with lysis of adhesions and ileostomy takedown with ileocolonic resection and primary anastomosis   . HERNIA REPAIR    . HERNIA REPAIR  2009   [facility]   . right knee surgery    . SMALL BOWEL REPAIR    . small bowel resections     multiple    Current Outpatient Rx  . Order #: [id number] Class: Historical Med  . Order #: [id number] Class: Historical Med  . Order #: [id number] Class: Historical Med  . Order #: [id number] Class: OTC  . Order #: [id number] Class: Historical Med  . Order #: [id number] Class: Historical Med  . Order #: [id number] Class: Historical Med  . Order #: [id number] Class: Historical Med  . Order #: [id number] Class: Historical Med  . Order #: [id number] Class: Print  . Order #: [id number] Class: Historical Med  . Order #: [id number] Class: Historical Med  . Order #: [id number] Class: Historical Med  . Order #: [id number] Class: Historical Med  . Order #: [id number] Class: Print  . Order #: [id number] Class: Print  . Order #: [id number] Class: Print  . Order #: [id number] Class: Print    Allergies Hctz [hydrochlorothiazide] and Morphine and related  Family History  Problem Relation Age of Onset  . Crohn's disease Mother   . Colon cancer Father        unknown    Social History Social History  Tobacco Use  . Smoking status: Former Smoker    Last attempt to quit: 06/30/2015    Years since quitting: 2.1  . Smokeless tobacco: Current User    Types: Chew  . Tobacco comment: 1 can every 2 months   Substance Use Topics  . Alcohol use: No    Comment: reformed alcohol X 12 years   . Drug use: Yes    Types: Marijuana    Comment: in remote past, "everything"     Review of Systems  Constitutional: No fever/chills. Positive generalized weakness.  Eyes: No visual changes. ENT: No  sore throat. Cardiovascular: Denies chest pain. Respiratory: Denies shortness of breath. Gastrointestinal: Positive LLQ abdominal pain. Positive nausea, no vomiting. Positive diarrhea.  No constipation. Genitourinary: Negative for dysuria. Musculoskeletal: Negative for back pain. Skin: Negative for rash. Neurological: Negative for headaches, focal weakness or numbness.  10-point ROS otherwise negative.  ____________________________________________   PHYSICAL EXAM:  VITAL SIGNS: ED Triage Vitals  Enc Vitals Group     BP 08/08/17 1928 (!) 160/92     Pulse Rate 08/08/17 1928 (!) 109     Resp 08/08/17 1928 (!) 24     Temp 08/08/17 1928 98.2 F (36.8 C)     Temp Source 08/08/17 1928 Oral     SpO2 08/08/17 1928 (!) 17 %     Weight 08/08/17 1931 210 lb (95.3 kg)     Height 08/08/17 1931 6' (1.829 m)     Pain Score 08/08/17 1930 9   Constitutional: Alert and oriented. Well appearing and in no acute distress. Eyes: Conjunctivae are normal. Head: Atraumatic. Nose: No congestion/rhinnorhea. Mouth/Throat: Mucous membranes are moist.   Neck: No stridor.  Cardiovascular: Normal rate, regular rhythm. Good peripheral circulation. Grossly normal heart sounds.   Respiratory: Normal respiratory effort.  No retractions. Lungs CTAB. Gastrointestinal: Soft with focal LLQ abdominal pain with guarding. No rebound. Mild tenderness in remaining quadrants. No distention.  Musculoskeletal: No lower extremity tenderness nor edema. No gross deformities of extremities. Neurologic:  Normal speech and language. No gross focal neurologic deficits are appreciated.  Skin:  Skin is warm, dry and intact. No rash noted.  ____________________________________________   LABS (all labs ordered are listed, but only abnormal results are displayed)  Labs Reviewed  COMPREHENSIVE METABOLIC PANEL - Abnormal; Notable for the following components:      Result Value   CO2 21 (*)    Glucose, Bld 152 (*)    BUN 28  (*)    Creatinine, Ser 1.39 (*)    Calcium 8.4 (*)    Total Protein 6.0 (*)    Albumin 3.2 (*)    Alkaline Phosphatase 28 (*)    GFR calc non Af Amer 57 (*)    All other components within normal limits  CBC WITH DIFFERENTIAL/PLATELET - Abnormal; Notable for the following components:   RBC 4.16 (*)    Hemoglobin 10.5 (*)    HCT 34.7 (*)    MCH 25.2 (*)    Neutro Abs 9.3 (*)    Lymphs Abs 0.6 (*)    All other components within normal limits  URINALYSIS, ROUTINE W REFLEX MICROSCOPIC - Abnormal; Notable for the following components:   Color, Urine STRAW (*)    Glucose, UA 150 (*)    All other components within normal limits  C DIFFICILE QUICK SCREEN W PCR REFLEX  LIPASE, BLOOD   ____________________________________________  RADIOLOGY  Ct Abdomen Pelvis W Contrast  Result Date: 08/08/2017 CLINICAL DATA:  Left  lower abdominal pain EXAM: CT ABDOMEN AND PELVIS WITH CONTRAST TECHNIQUE: Multidetector CT imaging of the abdomen and pelvis was performed using the standard protocol following bolus administration of intravenous contrast. CONTRAST:  195mL ISOVUE-300 IOPAMIDOL (ISOVUE-300) INJECTION 61% COMPARISON:  CT 07/13/2017, 07/01/2017, 06/22/2017, 09/15/2014 FINDINGS: Lower chest: Lung bases demonstrate no acute consolidation or pleural effusion. Normal heart size. Hepatobiliary: Contracted gallbladder. No calcified stones, focal hepatic abnormality or biliary dilatation Pancreas: Unremarkable. No pancreatic ductal dilatation or surrounding inflammatory changes. Spleen: Normal in size without focal abnormality. Adrenals/Urinary Tract: Adrenal glands are within normal limits. No hydronephrosis. Stable cysts within the kidneys. Bladder unremarkable Stomach/Bowel: Status post right hemicolectomy with small bowel colon anastomosis anteriorly, slightly to the left of midline. No definitive bowel wall thickening. Gaseous enlargement of the small bowel proximal to the anastomosis containing fluid, this  measures up to 5.6 cm in size. Diffuse gaseous enlargement of the remaining colon without colon wall thickening. Vascular/Lymphatic: Nonaneurysmal aorta. Stable enlarged central to anterior mesenteric lymph nodes. Reproductive: Coarse calcifications in the prostate. Other: Negative for free air or free fluid. Small fat within the inguinal canals. Tiny fat containing right ventral hernia. Musculoskeletal: Degenerative changes. No acute or suspicious abnormality. IMPRESSION: 1. Status post right hemicolectomy with small bowel colon anastomosis in the anterior abdomen just to the left of midline. Mild diffuse gaseous enlargement of colon with dilatation of small bowel upstream to the anastomosis, similar appearance on some of the comparison studies and therefore doubtful for obstruction. No definite bowel wall thickening is seen. There is diffuse debris and fluid in the colon suggestive of diarrheal process. 2. Stable enlarged central to anterior mesenteric lymph nodes Electronically Signed   By: Donavan Foil M.D.   On: 08/08/2017 22:11    ____________________________________________   PROCEDURES  Procedure(s) performed:   Procedures  None ____________________________________________   INITIAL IMPRESSION / ASSESSMENT AND PLAN / ED COURSE  Pertinent labs & imaging results that were available during my care of the patient were reviewed by me and considered in my medical decision making (see chart for details).  Patient presents to the emergency department for evaluation of continued left lower quadrant abdominal pain with worsening diarrhea.  Patient recently stopped vancomycin course for C. difficile diagnosed during a hospitalization at [facility] in mid March.  He is followed by gastroenterology at [facility] with a complicated history including Crohn's disease and prior C. difficile infections.  The last C. difficile infection, prior to this year, was 2008 and resolved after since Vancomycin course. Last  surgery was at [facility]. Patient was told during last admission that opiate medications would be limited for Crohn's flare pain on recommendation for GI.   CT unremarkable. Negative C. Diff screen. Plan for outpatient GI follow up. Patient advised that I could provide an extremely short course of opiate medications but discussed that any additional opiate meds would NOT be provided in the ED. This medication will act as a bridge to starting new infusion therapy which was delayed pending C. Diff screen which is now complete and negative.   At this time, I do not feel there is any life-threatening condition present. I have reviewed and discussed all results (EKG, imaging, lab, urine as appropriate), exam findings with patient. I have reviewed nursing notes and appropriate previous records.  I feel the patient is safe to be discharged home without further emergent workup. Discussed usual and customary return precautions. Patient and family (if present) verbalize understanding and are comfortable with this plan.  Patient  will follow-up with their primary care provider. If they do not have a primary care provider, information for follow-up has been provided to them. All questions have been answered.   ____________________________________________  FINAL CLINICAL IMPRESSION(S) / ED DIAGNOSES  Final diagnoses:  Left lower quadrant pain  Diarrhea, unspecified type     MEDICATIONS GIVEN DURING THIS VISIT:  Medications  sodium chloride 0.9 % bolus 1,000 mL (0 mLs Intravenous Stopped 08/08/17 2301)  metoCLOPramide (REGLAN) injection 10 mg (10 mg Intravenous Given 08/08/17 2013)  fentaNYL (SUBLIMAZE) injection 100 mcg (100 mcg Intravenous Given 08/08/17 2013)  iopamidol (ISOVUE-300) 61 % injection 100 mL (100 mLs Intravenous Contrast Given 08/08/17 2134)  fentaNYL (SUBLIMAZE) injection 100 mcg (100 mcg Intravenous Given 08/08/17 2205)     NEW OUTPATIENT MEDICATIONS STARTED DURING THIS  VISIT:  Discharge Medication List as of 08/08/2017 10:51 PM    START taking these medications   Details  HYDROcodone-acetaminophen (NORCO/VICODIN) 5-325 MG tablet Take 1 tablet by mouth every 6 (six) hours as needed., Starting Wed 08/08/2017, Print    loperamide (IMODIUM) 2 MG capsule Take 1 capsule (2 mg total) by mouth 4 (four) times daily as needed for diarrhea or loose stools., Starting Wed 08/08/2017, Print    metoCLOPramide (REGLAN) 10 MG tablet Take 1 tablet (10 mg total) by mouth every 6 (six) hours., Starting Wed 08/08/2017, Print        Note:  This document was prepared using Dragon voice recognition software and may include unintentional dictation errors.  Nanda Quinton, MD Emergency Medicine    Abimael Zeiter, [facility], MD 08/09/17 [phone]

## 2017-08-08 NOTE — ED Triage Notes (Signed)
Pt c/o left lower abdominal pain x 4 days; pt states he has been having loose stools; pt states he was just discharged from [facility] with C-diff and finished his dose of Vancomycin yesterday; pt states he has not been able to urinate x 3 days

## 2017-08-08 NOTE — Discharge Instructions (Signed)

## 2017-08-10 NOTE — Discharge Instructions (Signed)
Ustekinumab injection  What is this medicine?  USTEKINUMAB (US te KIN ue mab) is used to treat plaque psoriasis and psoriatic arthritis. This medicine is also used to treat Crohn's disease. It is not a cure.  This medicine may be used for other purposes; ask your health care provider or pharmacist if you have questions.  COMMON BRAND NAME(S): Stelara  What should I tell my health care provider before I take this medicine?  They need to know if you have any of these conditions:  -cancer  -diabetes  -immune system problems  -infection (especially a virus infection such as chickenpox, cold sores, or herpes) or history of infections  -receiving or have received allergy shots  -recently received or scheduled to receive a vaccine  -tuberculosis, a positive skin test for tuberculosis, or have recently been in close contact with someone who has tuberculosis  -an unusual reaction to ustekinumab, latex, other medicines, foods, dyes, or preservatives  -pregnant or trying to get pregnant  -breast-feeding  How should I use this medicine?  This medicine is for injection under the skin or infusion into a vein. It is usually given by a health care professional in a hospital or clinic setting.  If you get this medicine at home, you will be taught how to prepare and give this medicine. Use exactly as directed. Take your medicine at regular intervals. Do not take your medicine more often than directed.  It is important that you put your used needles and syringes in a special sharps container. Do not put them in a trash can. If you do not have a sharps container, call your pharmacist or healthcare provider to get one.  A special MedGuide will be given to you by the pharmacist with each prescription and refill. Be sure to read this information carefully each time.  Talk to your pediatrician regarding the use of this medicine in children. While this drug may be prescribed for children as young as 12 years for selected conditions,  precautions do apply.  Overdosage: If you think you have taken too much of this medicine contact a poison control center or emergency room at once.  NOTE: This medicine is only for you. Do not share this medicine with others.  What if I miss a dose?  If you miss a dose, take it as soon as you can. If it is almost time for your next dose, take only that dose. Do not take double or extra doses.  What may interact with this medicine?  Do not take this medicine with any of the following medications:  -live virus vaccines  This medicine may also interact with the following medications:  -cyclosporine  -immunosuppressives  -vaccines  -warfarin  This list may not describe all possible interactions. Give your health care provider a list of all the medicines, herbs, non-prescription drugs, or dietary supplements you use. Also tell them if you smoke, drink alcohol, or use illegal drugs. Some items may interact with your medicine.  What should I watch for while using this medicine?  Your condition will be monitored carefully while you are receiving this medicine. Tell your doctor or healthcare professional if your symptoms do not start to get better or if they get worse.  You will be tested for tuberculosis (TB) before you start this medicine. If your doctor prescribes any medicine for TB, you should start taking the TB medicine before starting this medicine. Make sure to finish the full course of TB medicine.  Call your   doctor or health care professional if you get a cold or other infection while receiving this medicine. Do not treat yourself. This medicine may decrease your body's ability to fight infection.  Talk to your doctor about your risk of cancer. You may be more at risk for certain types of cancers if you take this medicine.  What side effects may I notice from receiving this medicine?  Side effects that you should report to your doctor or health care professional as soon as possible:  -allergic reactions like skin  rash, itching or hives, swelling of the face, lips, or tongue  -breathing problems  -changes in vision  -confusion  -seizures  -signs and symptoms of infection like fever or chills; cough; sore throat; pain or trouble passing urine  -swollen lymph nodes in the neck, underarm, or groin areas  -unexplained weight loss  -unusually weak or tired  -vomiting  Side effects that usually do not require medical attention (report to your doctor or health care professional if they continue or are bothersome):  -headache  -nausea  -redness, itching, swelling, or bruising at site where injected  This list may not describe all possible side effects. Call your doctor for medical advice about side effects. You may report side effects to FDA at 1-800-FDA-1088.  Where should I keep my medicine?  Keep out of the reach of children.  If you are using this medicine at home, you will be instructed on how to store this medicine. Store the prefilled syringes in a refrigerator between 2 to 8 degrees C (36 to 46 degrees F). Keep in the original carton. Protect from light. Do not freeze. Do not shake. Throw away any unused medicine after the expiration date on the label.  NOTE: This sheet is a summary. It may not cover all possible information. If you have questions about this medicine, talk to your doctor, pharmacist, or health care provider.   2018 Elsevier/Gold Standard (2016-02-15 09:12:47)

## 2017-08-12 ENCOUNTER — Other Ambulatory Visit: Payer: Self-pay

## 2017-08-12 ENCOUNTER — Emergency Department (HOSPITAL_COMMUNITY)
Admission: EM | Admit: 2017-08-12 | Discharge: 2017-08-12 | Discharge status: Against Medical Advice | Payer: Medicare HMO | Attending: Emergency Medicine | Admitting: Emergency Medicine

## 2017-08-12 ENCOUNTER — Encounter (HOSPITAL_COMMUNITY): Payer: Self-pay | Admitting: Emergency Medicine

## 2017-08-12 ENCOUNTER — Emergency Department (HOSPITAL_COMMUNITY): Payer: Medicare HMO

## 2017-08-12 DIAGNOSIS — E1122 Type 2 diabetes mellitus with diabetic chronic kidney disease: Secondary | ICD-10-CM | POA: Diagnosis not present

## 2017-08-12 DIAGNOSIS — R1032 Left lower quadrant pain: Secondary | ICD-10-CM | POA: Insufficient documentation

## 2017-08-12 DIAGNOSIS — Z7289 Other problems related to lifestyle: Secondary | ICD-10-CM | POA: Insufficient documentation

## 2017-08-12 DIAGNOSIS — M069 Rheumatoid arthritis, unspecified: Secondary | ICD-10-CM | POA: Diagnosis not present

## 2017-08-12 DIAGNOSIS — Z79899 Other long term (current) drug therapy: Secondary | ICD-10-CM | POA: Insufficient documentation

## 2017-08-12 DIAGNOSIS — I509 Heart failure, unspecified: Secondary | ICD-10-CM | POA: Insufficient documentation

## 2017-08-12 DIAGNOSIS — I130 Hypertensive heart and chronic kidney disease with heart failure and stage 1 through stage 4 chronic kidney disease, or unspecified chronic kidney disease: Secondary | ICD-10-CM | POA: Insufficient documentation

## 2017-08-12 DIAGNOSIS — N189 Chronic kidney disease, unspecified: Secondary | ICD-10-CM | POA: Diagnosis not present

## 2017-08-12 DIAGNOSIS — K625 Hemorrhage of anus and rectum: Secondary | ICD-10-CM | POA: Diagnosis present

## 2017-08-12 DIAGNOSIS — Z87891 Personal history of nicotine dependence: Secondary | ICD-10-CM | POA: Insufficient documentation

## 2017-08-12 DIAGNOSIS — J45909 Unspecified asthma, uncomplicated: Secondary | ICD-10-CM | POA: Insufficient documentation

## 2017-08-12 LAB — COMPREHENSIVE METABOLIC PANEL
ALT: 24 U/L (ref 17–63)
AST: 22 U/L (ref 15–41)
Albumin: 3 g/dL — ABNORMAL LOW (ref 3.5–5.0)
Alkaline Phosphatase: 25 U/L — ABNORMAL LOW (ref 38–126)
Anion gap: 8 (ref 5–15)
BUN: 25 mg/dL — ABNORMAL HIGH (ref 6–20)
CHLORIDE: 109 mmol/L (ref 101–111)
CO2: 24 mmol/L (ref 22–32)
Calcium: 8.7 mg/dL — ABNORMAL LOW (ref 8.9–10.3)
Creatinine, Ser: 1.25 mg/dL — ABNORMAL HIGH (ref 0.61–1.24)
GFR calc Af Amer: >60 mL/min (ref >60)
Glucose, Bld: 101 mg/dL — ABNORMAL HIGH (ref 65–99)
POTASSIUM: 3.8 mmol/L (ref 3.5–5.1)
SODIUM: 141 mmol/L (ref 135–145)
Total Bilirubin: <0.1 mg/dL — ABNORMAL LOW (ref 0.3–1.2)
Total Protein: 5.7 g/dL — ABNORMAL LOW (ref 6.5–8.1)

## 2017-08-12 LAB — CBC WITH DIFFERENTIAL/PLATELET
BAND NEUTROPHILS: 0 %
BASOS ABS: 0 10*3/uL (ref 0.0–0.1)
BASOS PCT: 0 %
Blasts: 0 %
EOS ABS: 0.1 10*3/uL (ref 0.0–0.7)
EOS PCT: 1 %
HCT: 32.2 % — ABNORMAL LOW (ref 39.0–52.0)
Hemoglobin: 9.5 g/dL — ABNORMAL LOW (ref 13.0–17.0)
LYMPHS ABS: 1.6 10*3/uL (ref 0.7–4.0)
Lymphocytes Relative: 22 %
MCH: 25.1 pg — ABNORMAL LOW (ref 26.0–34.0)
MCHC: 29.5 g/dL — ABNORMAL LOW (ref 30.0–36.0)
MCV: 85 fL (ref 78.0–100.0)
METAMYELOCYTES PCT: 0 %
MONO ABS: 0.8 10*3/uL (ref 0.1–1.0)
Monocytes Relative: 11 %
Myelocytes: 0 %
NEUTROS ABS: 4.7 10*3/uL (ref 1.7–7.7)
NEUTROS PCT: 66 %
NRBC: 0 /100{WBCs}
Other: 0 %
PLATELETS: 225 10*3/uL (ref 150–400)
Promyelocytes Relative: 0 %
RBC: 3.79 MIL/uL — ABNORMAL LOW (ref 4.22–5.81)
WBC: 7.2 10*3/uL (ref 4.0–10.5)

## 2017-08-12 LAB — LIPASE, BLOOD: LIPASE: 35 U/L (ref 11–51)

## 2017-08-12 MED ORDER — ALBUTEROL SULFATE (2.5 MG/3ML) 0.083% IN NEBU
5.0000 mg | INHALATION_SOLUTION | Freq: Once | RESPIRATORY_TRACT | Status: AC
  Administered 2017-08-12: 5 mg via RESPIRATORY_TRACT
  Filled 2017-08-12: qty 6

## 2017-08-12 MED ORDER — ACETAMINOPHEN 325 MG PO TABS
650.0000 mg | ORAL_TABLET | ORAL | Status: DC | PRN
  Administered 2017-08-12: 650 mg via ORAL
  Filled 2017-08-12: qty 2

## 2017-08-12 MED ORDER — HEPARIN SOD (PORK) LOCK FLUSH 100 UNIT/ML IV SOLN
INTRAVENOUS | Status: AC
  Filled 2017-08-12: qty 5

## 2017-08-12 NOTE — ED Notes (Signed)
Pt being belligerent to staff.  Requesting port to be de accessed. Done as requested, pt cursing at his mother on the phone stating he was going to "walk the fuck home."  Did not want any assistance from RN and refused any attempt at de escalation.  Security paged.

## 2017-08-12 NOTE — ED Triage Notes (Signed)
Pt with hx of Chron's , recently at [facility] Reports had C diff now complains of blood in stools

## 2017-08-12 NOTE — ED Triage Notes (Signed)
PT HAS AN APPT AT [facility] TOMORROW FOR INFUSION PT HERE FOR PAIN CONTROL STATES HE HAS PAIN AND PEOPLE "ARE BLOWING ME OFF"  STATES HE HAS HAD NO PAIN MEDS SINCE BEING GIVEN VICODIN LAST WEEK

## 2017-08-12 NOTE — ED Notes (Signed)
When in room access port, pt stated "I am going to hit my head on this railing and knock myself out if I don't get any pain medicine." Pt requesting Tylenol at this time.

## 2017-08-12 NOTE — ED Provider Notes (Signed)
[facility] EMERGENCY DEPARTMENT Provider Note   CSN: [id number] Arrival date & time: 08/12/17  1315     History   Chief Complaint Chief Complaint  Patient presents with  . Rectal Bleeding    HPI Walter Diaz is a 52 y.o. male.  HPI Patient with history of Crohn's disease recently completed course of prednisone and vancomycin for C. difficile.  He has been seen multiple times in the emergency department and states is been seen by 4 doctors in the past few days and he is upset because they will not prescribe him any oxycodone or Vicodin.  States he continues to have a small amount of blood mixed coating his stool.  He has ongoing left lower quadrant pain which is essentially unchanged.  It does worsen after eating.  Is been given Bentyl by his primary physician with minimal improvement.  Has follow-up with gastroenterology.  Denies any fever or chills.  Has nausea without vomiting.  Patient also complains of nonproductive cough, nasal congestion and wheezing worsening over the last few days.  States that the coughing makes the abdominal pain worse. Past Medical History:  Diagnosis Date  . Anemia   . Arthritis   . Asthma   . Cancer ([place])   . CHF (congestive heart failure) ([place])   . Chronic kidney disease   . Crohn disease ([place])   . Diabetes mellitus without complication ([place])   . Hypertension   . Multiple sclerosis ([place])   . RA (rheumatoid arthritis) ([place])   . Sleep apnea     Patient Active Problem List   Diagnosis Date Noted  . Iron deficiency anemia due to chronic blood loss 07/03/2017  . Exacerbation of Crohn's disease ([place]) 07/02/2017  . Diplopia   . Crohn's disease of intestine with rectal bleeding ([place])   . Crohn's colitis ([place]) 07/01/2017  . Multiple sclerosis ([place]) 07/01/2017  . Dehydration 07/01/2017  . Anemia 07/01/2017    Past Surgical History:  Procedure Laterality Date  . APPENDECTOMY    . COLONOSCOPY  03/2017   [place]: Moderately active  Crohn's disease of distal ileum and ileocolonic anastomosis s/p biopsy, internal hemorrhoids  . ESOPHAGOGASTRODUODENOSCOPY  03/2017   [facility]: 1 cm hiatal hernia, irregular Z-line s/p biopsy, mild erosive gastritis, s/p biopsy. Path unavailable  . EXPLORATORY LAPAROTOMY  11/2015   [facility]: with lysis of adhesions and ileostomy takedown with ileocolonic resection and primary anastomosis   . HERNIA REPAIR    . HERNIA REPAIR  2009   [facility]   . right knee surgery    . SMALL BOWEL REPAIR    . small bowel resections     multiple        Home Medications    Prior to Admission medications   Medication Sig Start Date End Date Taking? Authorizing Provider  amphetamine-dextroamphetamine (ADDERALL) 10 MG tablet Take 10 mg by mouth 2 (two) times daily.   Yes [provider]  cyanocobalamin (,VITAMIN B-12,) 1000 MCG/ML injection Inject into the muscle every 30 (thirty) days. 05/25/14  Yes [provider]  dicyclomine (BENTYL) 10 MG capsule Take 10 mg by mouth 3 (three) times daily as needed. 07/27/17  Yes [provider]  ferrous sulfate 325 (65 FE) MG tablet Take 1 tablet (325 mg total) by mouth 2 (two) times daily with a meal. 07/03/17  Yes Tat, Shanon Brow, MD  FLUoxetine (PROZAC) 10 MG capsule Take 10 mg by mouth daily.    Yes [provider]  hydrOXYzine (ATARAX/VISTARIL) 25  MG tablet Take 25 mg by mouth every morning. 07/27/17  Yes [provider]  loperamide (IMODIUM) 2 MG capsule Take 1 capsule (2 mg total) by mouth 4 (four) times daily as needed for diarrhea or loose stools. 08/08/17  Yes Long, Wonda Olds, MD  metoCLOPramide (REGLAN) 10 MG tablet Take 1 tablet (10 mg total) by mouth every 6 (six) hours. 08/08/17  Yes Long, Wonda Olds, MD  Multiple Vitamin (MULTIVITAMIN WITH MINERALS) TABS tablet Take 1 tablet by mouth daily.   Yes [provider]  ondansetron (ZOFRAN-ODT) 4 MG disintegrating tablet DISSOLVE 1 tablet ON THE TONGUE every 8  hours as needed for Nausea/Vomiting. 05/07/17  Yes [provider]  pantoprazole (PROTONIX) 40 MG tablet Take 40 mg by mouth daily. 03/21/17  Yes [provider]  pregabalin (LYRICA) 150 MG capsule Take 150 mg by mouth 2 (two) times daily.    Yes [provider]  Probiotic Product (PROBIOTIC-10 PO) Take 1 capsule by mouth every morning.   Yes [provider]  traZODone (DESYREL) 50 MG tablet Take 25-50 mg by mouth at bedtime as needed. 07/22/17 08/21/17 Yes [provider]  HYDROcodone-acetaminophen (NORCO/VICODIN) 5-325 MG tablet Take 1 tablet by mouth every 6 (six) hours as needed. Patient not taking: Reported on 08/12/2017 08/08/17   Long, Wonda Olds, MD  oxyCODONE-acetaminophen (PERCOCET) 5-325 MG tablet Take 1-2 tablets by mouth every 4 (four) hours as needed for moderate pain or severe pain. Patient not taking: Reported on 07/09/2017 07/03/17   Orson Eva, MD  predniSONE (DELTASONE) 10 MG tablet Take 4 tablets (40 mg total) by mouth daily with breakfast. X 2 weeks.  Then 3 tablets (30 mg) daily x 7 days.  Then 2 tablets (20 mg) daily x 7 days.  Then 1 tablet (10 mg) daily x 7 days Patient not taking: Reported on 08/12/2017 07/04/17   Orson Eva, MD  ustekinumab [facility]) 90 MG/ML SOSY injection Inject into the skin every 8 (eight) weeks. 11/28/16   [provider]    Family History Family History  Problem Relation Age of Onset  . Crohn's disease Mother   . Colon cancer Father        unknown    Social History Social History   Tobacco Use  . Smoking status: Former Smoker    Last attempt to quit: 06/30/2015    Years since quitting: 2.1  . Smokeless tobacco: Current User    Types: Chew  . Tobacco comment: 1 can every 2 months   Substance Use Topics  . Alcohol use: No    Comment: reformed alcohol X 12 years   . Drug use: Yes    Types: Marijuana    Comment: in remote past, "everything"      Allergies   Hctz [hydrochlorothiazide] and  Morphine and related   Review of Systems Review of Systems  Constitutional: Negative for chills and fever.  HENT: Positive for congestion. Negative for sinus pressure and sinus pain.   Eyes: Negative for visual disturbance.  Respiratory: Positive for cough and wheezing. Negative for shortness of breath.   Cardiovascular: Negative for chest pain.  Gastrointestinal: Positive for abdominal pain, blood in stool and nausea. Negative for constipation, diarrhea and vomiting.  Genitourinary: Negative for dysuria, flank pain, frequency and hematuria.  Musculoskeletal: Negative for back pain, myalgias, neck pain and neck stiffness.  Skin: Negative for rash and wound.  Neurological: Negative for dizziness, weakness, light-headedness, numbness and headaches.  All other systems reviewed and are negative.  Physical Exam Updated Vital Signs BP (!) 138/94   Pulse 92   Temp 98.2 F (36.8 C) (Oral)   Resp 16   Ht 6' (1.829 m)   Wt 95.3 kg (210 lb)   SpO2 94%   BMI 28.48 kg/m   Physical Exam  Constitutional: He is oriented to person, place, and time. He appears well-developed and well-nourished. No distress.  Patient appears quite comfortable.  Sleeping on a hospital stretcher.  HENT:  Head: Normocephalic and atraumatic.  Mouth/Throat: Oropharynx is clear and moist. No oropharyngeal exudate.  Eyes: Pupils are equal, round, and reactive to light. EOM are normal.  Neck: Normal range of motion. Neck supple.  Cardiovascular: Normal rate and regular rhythm. Exam reveals no gallop and no friction rub.  No murmur heard. Pulmonary/Chest: Effort normal. He has wheezes.  Patient does have diffuse expiratory wheezing.  He is in no respiratory distress.  Abdominal: Soft. Bowel sounds are normal. There is tenderness. There is no rebound and no guarding.  Left lower quadrant tenderness without definite rebound or guarding.  Mildly increased bowel sounds throughout.  Musculoskeletal: Normal range of  motion. He exhibits no edema or tenderness.  No CVA tenderness bilaterally.  Neurological: He is alert and oriented to person, place, and time.  Moves all extremities without focal deficit.  Sensation fully intact.  Skin: Skin is warm and dry. Capillary refill takes less than 2 seconds. No rash noted. He is not diaphoretic. No erythema.  Psychiatric: He has a normal mood and affect. His behavior is normal.  Nursing note and vitals reviewed.    ED Treatments / Results  Labs (all labs ordered are listed, but only abnormal results are displayed) Labs Reviewed  CBC WITH DIFFERENTIAL/PLATELET - Abnormal; Notable for the following components:      Result Value   RBC 3.79 (*)    Hemoglobin 9.5 (*)    HCT 32.2 (*)    MCH 25.1 (*)    MCHC 29.5 (*)    All other components within normal limits  COMPREHENSIVE METABOLIC PANEL - Abnormal; Notable for the following components:   Glucose, Bld 101 (*)    BUN 25 (*)    Creatinine, Ser 1.25 (*)    Calcium 8.7 (*)    Total Protein 5.7 (*)    Albumin 3.0 (*)    Alkaline Phosphatase 25 (*)    Total Bilirubin <0.1 (*)    All other components within normal limits  LIPASE, BLOOD    EKG None  Radiology No results found.  Procedures Procedures (including critical care time)  Medications Ordered in ED Medications  albuterol (PROVENTIL) (2.5 MG/3ML) 0.083% nebulizer solution 5 mg (5 mg Nebulization Given 08/12/17 1634)     Initial Impression / Assessment and Plan / ED Course  I have reviewed the triage vital signs and the nursing notes.  Pertinent labs & imaging results that were available during my care of the patient were reviewed by me and considered in my medical decision making (see chart for details).    Chronic findings on abdominal x-ray.  Patient is well-appearing.  Drug-seeking behavior.  Patient left prior to completion of workup, reevaluation and before discharge instructions were given.   Final Clinical Impressions(s) / ED  Diagnoses   Final diagnoses:  Left lower quadrant pain    ED Discharge Orders    None       Julianne Rice, MD 08/15/17 [phone]

## 2017-08-13 ENCOUNTER — Encounter (HOSPITAL_COMMUNITY): Payer: Self-pay

## 2017-08-13 ENCOUNTER — Encounter (HOSPITAL_COMMUNITY)
Admission: RE | Admit: 2017-08-13 | Discharge: 2017-08-13 | Disposition: A | Discharge status: Home / Self Care | Payer: Medicare HMO | Source: Ambulatory Visit | Attending: Internal Medicine | Admitting: Internal Medicine

## 2017-08-13 DIAGNOSIS — K50818 Crohn's disease of both small and large intestine with other complication: Secondary | ICD-10-CM | POA: Diagnosis present

## 2017-08-13 MED ORDER — HEPARIN SOD (PORK) LOCK FLUSH 100 UNIT/ML IV SOLN
500.0000 [IU] | INTRAVENOUS | Status: AC | PRN
  Administered 2017-08-13: 500 [IU]

## 2017-08-13 MED ORDER — SODIUM CHLORIDE 0.9 % IV SOLN
Freq: Once | INTRAVENOUS | Status: AC
  Administered 2017-08-13: 08:00:00 via INTRAVENOUS

## 2017-08-13 MED ORDER — ACETAMINOPHEN 325 MG PO TABS
650.0000 mg | ORAL_TABLET | Freq: Once | ORAL | Status: AC
  Administered 2017-08-13: 650 mg via ORAL

## 2017-08-13 MED ORDER — USTEKINUMAB 130 MG/26ML IV SOLN
520.0000 mg | Freq: Once | INTRAVENOUS | Status: AC
  Administered 2017-08-13: 520 mg via INTRAVENOUS
  Filled 2017-08-13: qty 104

## 2017-08-13 MED ORDER — HEPARIN SOD (PORK) LOCK FLUSH 100 UNIT/ML IV SOLN
INTRAVENOUS | Status: AC
  Filled 2017-08-13: qty 5

## 2017-08-13 MED ORDER — ACETAMINOPHEN 325 MG PO TABS
ORAL_TABLET | ORAL | Status: AC
  Filled 2017-08-13: qty 2

## 2017-08-13 MED ORDER — SODIUM CHLORIDE 0.9% FLUSH: 10.0000 mL | INTRAVENOUS | Status: DC | PRN

## 2017-08-13 NOTE — Progress Notes (Signed)
Patient tolerated infusion without s/s of reaction.  No distress noted.  States he feels better already.  Patient information provided to patient regarding medication and side effects. Verbalized understanding.  States that he does not wish to do subsequent injections at home due to complications from other sq medications he has taken at home in the past.  Has a follow up appointment with Dr Emelda Fear tomorrow.  Advised him to discuss with her and she can advise on what to due further.

## 2018-06-04 DIAGNOSIS — G8929 Other chronic pain: Secondary | ICD-10-CM

## 2018-06-04 DIAGNOSIS — M545 Low back pain, unspecified: Secondary | ICD-10-CM

## 2018-06-04 NOTE — ED Notes (Signed)
Pt verbalizing complaints about "only getting motrin" for pain.     Mortimer Fries, RN  06/04/18 [phone]

## 2018-06-04 NOTE — ED Provider Notes (Signed)
[facility] ED  EMERGENCY DEPARTMENT ENCOUNTER      Pt Name: Dennis Rivas  MRN: [id number]  Birthdate 05/04/1965  Date of evaluation: 06/04/2018  Provider: Sol Passer Amalio Loe, DO     CHIEF COMPLAINT       Chief Complaint   Patient presents with   ??? Back Pain     Patietn states he was cutting down a tree and fell backwards and landed on a tree stump         HISTORY OF PRESENT ILLNESS   (Location/Symptom, Timing/Onset,Context/Setting, Quality, Duration, Modifying Factors, Severity) Note limiting factors.   HPI    Dennis Rivas is a 53 y.o. male who presents to the emergency department complaining of back pain.  He states he fell backwards and fell onto the stump but this morning at 10 a.m.  He continues to have pain.  At this tablet had caught a therapy lays on his abdomen is worse when he moves and twists.  He denies any bowel bladder dysfunction or numbness or tingling.  He says it is more on the right than on the left.      Nursing Notes were reviewed.    REVIEW OFSYSTEMS    (2+ for level 4; 10+ level 5)   Review of Systems    Generalized denies weakness fatigue no fever chills sweats.  Cardiac any chest pain diaphoresis.  Respiratory dyspnea wheezing.  Gastrointestinal no nausea vomiting abdominal pain.  Genitourinary no burning urination frequency.  Neuro denies headache or seizures.  Eyes any discharge redness.  ENT no sore throat earache.  Skin denies any wound rashes itching.  Muscle skeletal as above.  Psych complains of depression.  Heme denies bruising bleeding.  Immunologic denies any hives.      PAST MEDICAL HISTORY     Past Medical History:   Diagnosis Date   ??? Anemia    ??? Arthritis    ??? Crohn's disease (H[facility])    ??? Hypertension    ??? Multiple sclerosis ([facility])            SURGICAL HISTORY       Past Surgical History:   Procedure Laterality Date   ??? ABDOMEN SURGERY     ??? APPENDECTOMY     ??? HERNIA REPAIR     ??? KNEE SURGERY     ??? SMALL INTESTINE SURGERY         CURRENT MEDICATIONS       Previous  Medications    ACETAMINOPHEN (TYLENOL) 325 MG TABLET    Take 2 tablets by mouth every 4 hours as needed for Pain    AMPHETAMINE-DEXTROAMPHETAMINE (ADDERALL, 10MG ,) 10 MG TABLET    Take 10 mg by mouth 2 times daily    CHLORTHALIDONE (HYGROTON) 25 MG TABLET    Take 12.5 mg by mouth daily    CODEINE 30 MG TABLET    Take 30 mg by mouth every 6 hours as needed for Pain .    DIPHENOXYLATE-ATROPINE (LOMOTIL) 2.5-0.025 MG PER TABLET    Take 1 tablet by mouth 4 times daily as needed for Diarrhea    ERGOCALCIFEROL (ERGOCALCIFEROL) 50000 UNITS CAPSULE    Take 50,000 Units by mouth once a week    FERROUS SULFATE 325 (65 FE) MG TABLET    Take 325 mg by mouth daily (with breakfast)    LOPERAMIDE (IMODIUM) 2 MG CAPSULE    Take 2 mg by mouth 4 times daily as needed for Diarrhea    MULTIPLE  VITAMINS-MINERALS (THERAPEUTIC MULTIVITAMIN-MINERALS) TABLET    Take 1 tablet by mouth daily    OMEPRAZOLE (PRILOSEC) 20 MG DELAYED RELEASE CAPSULE    Take 20 mg by mouth daily    ONDANSETRON (ZOFRAN) 4 MG TABLET    Take 1 tablet by mouth every 6 hours as needed for Nausea or Vomiting    OPIUM TINCTURE, PAREGORIC, PO    Take by mouth    OXYCODONE 5 MG CAPSULE    Take 5 mg by mouth every 4 hours as needed for Pain .    PREDNISONE (DELTASONE) 10 MG TABLET    Prednisone taper: 30mg  X 1week, 25mg  X1 week, 20mg  X1 week, 15mg  X1week, 10mg X1 week, 5mg  X1week    PREGABALIN (LYRICA) 150 MG CAPSULE    Take 150 mg by mouth 2 times daily    PROMETHAZINE (PHENERGAN) 25 MG TABLET    Take 25 mg by mouth every 6 hours as needed for Nausea    PSYLLIUM (KONSYL) 28.3 % PACK    Take 1 packet by mouth daily    VEDOLIZUMAB (ENTYVIO IV)    Infuse intravenously Indications: every other month. last infusioon 09/23/14       ALLERGIES     Hydrochlorothiazide; Fish-derived products; and Morphine    FAMILY HISTORY     History reviewed. No pertinent family history.     SOCIAL HISTORY       Social History     Socioeconomic History   ??? Marital status: Single     Spouse name:  None   ??? Number of children: None   ??? Years of education: None   ??? Highest education level: None   Occupational History   ??? None   Social Needs   ??? Financial resource strain: None   ??? Food insecurity:     Worry: None     Inability: None   ??? Transportation needs:     Medical: None     Non-medical: None   Tobacco Use   ??? Smoking status: Current Every Day Smoker     Types: Cigars   Substance and Sexual Activity   ??? Alcohol use: No   ??? Drug use: Yes     Types: Marijuana   ??? Sexual activity: None   Lifestyle   ??? Physical activity:     Days per week: None     Minutes per session: None   ??? Stress: None   Relationships   ??? Social connections:     Talks on phone: None     Gets together: None     Attends religious service: None     Active member of club or organization: None     Attends meetings of clubs or organizations: None     Relationship status: None   ??? Intimate partner violence:     Fear of current or ex partner: None     Emotionally abused: None     Physically abused: None     Forced sexual activity: None   Other Topics Concern   ??? None   Social History Narrative   ??? None       SCREENINGS           PHYSICAL EXAM    (up to 7 for level 4, 8 ormore for level 5)     ED Triage Vitals [06/04/18 2238]   BP Temp Temp Source Pulse Resp SpO2 Height Weight   (!) 138/119 98.2 ??F (36.8 ??C) Oral 100 18 98 % 6' (1.829  m) 212 lb (96.2 kg)       Physical Exam  Constitutional:       Appearance: He is well-developed.   HENT:      Head: Normocephalic and atraumatic.      Right Ear: External ear normal.      Left Ear: External ear normal.      Nose: Nose normal.   Eyes:      Conjunctiva/sclera: Conjunctivae normal.      Pupils: Pupils are equal, round, and reactive to light.   Neck:      Musculoskeletal: Normal range of motion.   Cardiovascular:      Rate and Rhythm: Normal rate and regular rhythm.      Heart sounds: Normal heart sounds. No murmur.   Pulmonary:      Effort: Pulmonary effort is normal. No respiratory distress.      Breath  sounds: Normal breath sounds.   Abdominal:      General: Bowel sounds are normal.      Palpations: Abdomen is soft.   Musculoskeletal: Normal range of motion.         General: Tenderness present. No swelling or deformity.      Comments: Generalized tenderness to his low back.  No obvious swelling ecchymosis or bruising   Skin:     General: Skin is warm and dry.      Capillary Refill: Capillary refill takes less than 2 seconds.   Neurological:      Mental Status: He is alert and oriented to person, place, and time.             DIAGNOSTIC RESULTS     EKG (Per Emergency Physician):       RADIOLOGY (Per Emergency Physician):       Interpretation per the Radiologist below, ifavailable at the time of this note:  Xr Lumbar Spine (min 4 Views)    Result Date: 06/04/2018  Patient Name:  Dennis Rivas, Dennis Rivas MRN:  [id number] FIN:  [id number] ---Diagnostic Radiology--- Exam Date/Time        06/04/2018 23:28:55 EST                             Exam                  CR Spine Lumbosacral 4+ Views                       Ordering Physician    [id number], Victorious Kundinger                            Accession Number      [id number]                                       CPT4 Codes [id number] () Reason For Exam fall Report LUMBAR SPINE 5 VIEWS CLINICAL INDICATION: fall, pain TECHNIQUE: 5 views of the lumbar spine. COMPARISON: None. FINDINGS: Mild dextroconvex curvature may be positional versus soft tissue spasm. No loss of height or gross malalignment of vertebral bodies. No osseous destruction. Variable disc space narrowing and endplate degenerative changes in the L3- S1 levels, most severe in the L5-S1 level, including vacuum disc changes and facet arthrosis. Paraspinal soft tissues grossly unremarkable. IMPRESSION:  1. No acute osseous abnormality.  2. Degenerative change. Report Dictated on Workstation: [facility] ---** Final ---** Dictating Physician: Georgetta Haber, MD, [facility] Signed Date and Time: 06/04/2018 11:52 pm Signed by: Georgetta Haber, MD, [facility]  Transcribed Date and Time: 06/04/2018 11:53      ED BEDSIDE ULTRASOUND:   Performed by ED Physician - none    LABS:  Labs Reviewed - No data to display     All other labs were within normal range or not returned as of this dictation.    EMERGENCY DEPARTMENT COURSE and DIFFERENTIALDIAGNOSIS/MDM:   Vitals:    Vitals:    06/04/18 2238   BP: (!) 138/119   Pulse: 100   Resp: 18   Temp: 98.2 ??F (36.8 ??C)   TempSrc: Oral   SpO2: 98%   Weight: 96.2 kg (212 lb)   Height: 6' (1.829 m)       Medications   HYDROcodone-acetaminophen (NORCO) 5-325 MG per tablet 1 tablet (has no administration in time range)   predniSONE (DELTASONE) tablet 10 mg (has no administration in time range)   acetaminophen (TYLENOL) tablet 1,000 mg (1,000 mg Oral Given 06/04/18 2259)   ibuprofen (ADVIL;MOTRIN) tablet 400 mg (400 mg Oral Given 06/04/18 2344)       MDM.    History and physical was obtained.  He is complaining of low back pain.  He was able to walk but did complain of pain and was unsteady because of the pain.  His x-rays were unremarkable.  I will give her Vicodin here.  Orders was reviewed.  He will be discharged home to follow up with Dr. Pollyann Savoy.    REVAL:         CRITICAL CARE TIME   Total Critical Care time was     minutes, excluding separatelyreportable procedures.  There was a high probability ofclinically significant/life threatening deterioration in the patient's condition which required my urgent intervention.     CONSULTS:  None    PROCEDURES:  Unless otherwise noted below, none     Procedures    IMPRESSION      1. Acute exacerbation of chronic low back pain          DISPOSITION/PLAN   DISPOSITION        PATIENT REFERRED TO:  No follow-up provider specified.    DISCHARGE MEDICATIONS:  New Prescriptions    PREDNISONE (DELTASONE) 10 MG TABLET    Take 1 tablet by mouth 2 times daily for 3 days          (Please note:  Portions of this note were completed with a voice recognition program.  Efforts were made to edit thedictations but  occasionally words and phrases are mis-transcribed.)  Form v2016.J.5-cn    Margo Aye, DO (electronically signed)  Emergency Medicine Provider        Margo Aye, DO  06/05/18 0010

## 2018-06-04 NOTE — ED Triage Notes (Signed)
Patient arrives with c/o mid-right lower back pain s/p trip fall onto tree stump while cutting down a tree. +pain and tenderness with small abrasion noted. Patient states pain radiates down right leg into right foot. Patient denies loss of bowel or bladder control. Patient unable to sit on the bed. Patient standing then laying trying to find position of comfort.Ice pack given. Call bell and friend bedside.

## 2018-06-04 NOTE — ED Notes (Signed)
Report off to [facility], [place]  06/04/18 2308

## 2018-06-05 ENCOUNTER — Inpatient Hospital Stay
Admit: 2018-06-05 | Discharge: 2018-06-05 | Disposition: A | Discharge status: Home / Self Care | Attending: Emergency Medicine

## 2018-06-05 MED ORDER — HYDROCODONE-ACETAMINOPHEN 5-325 MG PO TABS
5-325 MG | Freq: Once | ORAL | Status: AC
Start: 2018-06-05 — End: 2018-06-05
  Administered 2018-06-05: 05:00:00 1 via ORAL

## 2018-06-05 MED ORDER — PREDNISONE 10 MG PO TABS
10 MG | ORAL_TABLET | Freq: Two times a day (BID) | ORAL | 0 refills | Status: AC
Start: 2018-06-05 — End: 2018-06-08

## 2018-06-05 MED ORDER — PREDNISONE 10 MG PO TABS
10 MG | Freq: Once | ORAL | Status: AC
Start: 2018-06-05 — End: 2018-06-05
  Administered 2018-06-05: 05:00:00 10 mg via ORAL

## 2018-06-05 MED ORDER — ACETAMINOPHEN 500 MG PO TABS
500 MG | Freq: Once | ORAL | Status: AC
Start: 2018-06-05 — End: 2018-06-04
  Administered 2018-06-05: 04:00:00 1000 mg via ORAL

## 2018-06-05 MED ORDER — IBUPROFEN 400 MG PO TABS
400 MG | Freq: Once | ORAL | Status: AC
Start: 2018-06-05 — End: 2018-06-04
  Administered 2018-06-05: 05:00:00 400 mg via ORAL

## 2018-06-05 MED FILL — MAPAP EXTRA STRENGTH 500 MG PO TABS: 500 mg | ORAL | Qty: 2

## 2018-06-05 MED FILL — HYDROCODONE-ACETAMINOPHEN 5-325 MG PO TABS: 5-325 mg | ORAL | Qty: 1

## 2018-06-05 MED FILL — IBUPROFEN 400 MG PO TABS: 400 mg | ORAL | Qty: 1

## 2018-06-05 MED FILL — PREDNISONE 10 MG PO TABS: 10 mg | ORAL | Qty: 1

## 2018-06-05 NOTE — Other (Unsigned)
Patient Acct Nbr: [id number]   Primary AUTH/CERT:   Primary Insurance Company Name: [employer]  Primary Insurance Plan name: Medicare A  Primary Insurance Group Number:   Primary Insurance Plan Type: Quarry manager Insurance Policy Number: [id number]    Secondary AUTH/CERT:   Secondary Insurance Company Name: [employer]  Secondary Insurance Plan name: Medicare B  Secondary Insurance Group Number:   Secondary Insurance Plan Type: Journalist, newspaper Insurance Policy Number: [id number]

## 2018-06-05 NOTE — Discharge Instructions (Addendum)
Rest.    Ice 15 minutes every 4-6 hours.    Return if increasing pain problems or concerns.    Follow-up with Dr. Pollyann Savoy in 1-2 days.

## 2018-06-05 NOTE — ED Triage Notes (Signed)
Pt given dc instructions and follow up care. Pt verbalizes understanding. Pt ambulates indep to dc area and car. Home w sig other

## 2018-09-11 ENCOUNTER — Inpatient Hospital Stay
Admit: 2018-09-11 | Discharge: 2018-09-11 | Disposition: A | Discharge status: Home / Self Care | Attending: Emergency Medicine

## 2018-09-11 DIAGNOSIS — S025XXA Fracture of tooth (traumatic), initial encounter for closed fracture: Secondary | ICD-10-CM

## 2018-09-11 MED ORDER — PENICILLIN V POTASSIUM 500 MG PO TABS
500 MG | ORAL_TABLET | Freq: Three times a day (TID) | ORAL | 0 refills | Status: AC
Start: 2018-09-11 — End: 2018-09-21

## 2018-09-11 NOTE — Discharge Instructions (Signed)
Can use dental cement over affected tooth

## 2018-09-11 NOTE — ED Notes (Signed)
Patient reviewed DC Instructions and prescription    No change  In pain     Geoffery Lyons, RN  09/11/18 [phone]

## 2018-09-11 NOTE — Other (Unsigned)
Patient Acct Nbr: [id number]   Primary AUTH/CERT:   Primary Insurance Company Name: [employer] Insurance Plan name: [place]  Primary Insurance Group Number: [id number]  Primary Insurance Plan Type: Health  Primary Insurance Policy Number: [id number]

## 2018-09-11 NOTE — ED Provider Notes (Signed)
The history is provided by the patient.   Dental Pain   Location:  Upper  Upper teeth location:  16/LU 3rd molar  Quality:  Aching  Severity:  Mild  Onset quality:  Gradual  Duration:  12 hours  Timing:  Constant  Progression:  Unchanged  Chronicity:  New  Context: dental fracture    Relieved by:  Nothing  Worsened by:  Nothing  Ineffective treatments:  None tried  Associated symptoms: gum swelling    Associated symptoms: no congestion, no difficulty swallowing, no drooling, no facial pain, no facial swelling, no fever, no headaches, no neck pain, no neck swelling, no oral bleeding, no oral lesions and no trismus    Risk factors: smoking        Review of Systems   Constitutional: Negative for fever.   HENT: Positive for dental problem. Negative for congestion, drooling, facial swelling and mouth sores.    Musculoskeletal: Negative for neck pain.   Neurological: Negative for headaches.   All other systems reviewed and are negative.      Physical Exam  Vitals signs and nursing note reviewed.   Constitutional:       General: He is not in acute distress.     Appearance: Normal appearance. He is normal weight. He is not ill-appearing, toxic-appearing or diaphoretic.   HENT:      Head: Normocephalic.      Right Ear: Tympanic membrane, ear canal and external ear normal. There is no impacted cerumen.      Left Ear: Tympanic membrane, ear canal and external ear normal. There is no impacted cerumen.      Nose: Nose normal.      Mouth/Throat:      Mouth: Mucous membranes are moist.      Pharynx: Oropharynx is clear. No oropharyngeal exudate or posterior oropharyngeal erythema.      Comments: Number 16 th tooth has dental fracture  Eyes:      General: No scleral icterus.        Right eye: No discharge.         Left eye: No discharge.      Extraocular Movements: Extraocular movements intact.      Conjunctiva/sclera: Conjunctivae normal.      Pupils: Pupils are equal, round, and reactive to light.   Neck:      Musculoskeletal:  Normal range of motion and neck supple. No neck rigidity or muscular tenderness.      Vascular: No carotid bruit.   Cardiovascular:      Rate and Rhythm: Normal rate and regular rhythm.      Pulses: Normal pulses.      Heart sounds: Normal heart sounds. No murmur. No friction rub. No gallop.    Pulmonary:      Effort: Pulmonary effort is normal. No respiratory distress.      Breath sounds: Normal breath sounds. No stridor. No wheezing, rhonchi or rales.   Chest:      Chest wall: No tenderness.   Abdominal:      General: Abdomen is flat. Bowel sounds are normal. There is no distension.      Palpations: Abdomen is soft. There is no mass.      Tenderness: There is no abdominal tenderness. There is no right CVA tenderness, left CVA tenderness, guarding or rebound.      Hernia: No hernia is present.   Musculoskeletal: Normal range of motion.         General: No swelling, tenderness,  deformity or signs of injury.      Right lower leg: No edema.      Left lower leg: No edema.   Lymphadenopathy:      Cervical: No cervical adenopathy.   Skin:     General: Skin is warm.      Capillary Refill: Capillary refill takes less than 2 seconds.   Neurological:      General: No focal deficit present.      Mental Status: He is alert and oriented to person, place, and time. Mental status is at baseline.      Cranial Nerves: No cranial nerve deficit.      Sensory: No sensory deficit.      Motor: No weakness.      Coordination: Coordination normal.      Gait: Gait normal.      Deep Tendon Reflexes: Reflexes normal.   Psychiatric:         Mood and Affect: Mood normal.         Behavior: Behavior normal.         Thought Content: Thought content normal.         Judgment: Judgment normal.         Procedures    MDM  Number of Diagnoses or Management Options  Closed fracture of tooth, initial encounter:   Diagnosis management comments: Patient ate something last night and has a dental fracture of number 16 th tooth. I will place on antibiotics  and give dental sheet to patient. Patient was advised on using dental cement over the tooth. He is otherwise nontoxic in appearence    Risk of Complications, Morbidity, and/or Mortality  Presenting problems: moderate  Diagnostic procedures: moderate  Management options: moderate    Patient Progress  Patient progress: stable           Labs      Radiology      EKG Interpretation.          Dennis CarrowVijay Yvanna Vidas, MD  09/11/18 1456

## 2018-09-11 NOTE — ED Notes (Signed)
DC with dental  Sheet for follow up     Geoffery Lyons, RN  09/11/18 1510

## 2019-11-17 ENCOUNTER — Inpatient Hospital Stay
Admit: 2019-11-17 | Discharge: 2019-11-18 | Disposition: A | Discharge status: Home / Self Care | Attending: Emergency Medicine

## 2019-11-17 DIAGNOSIS — J189 Pneumonia, unspecified organism: Secondary | ICD-10-CM

## 2019-11-17 MED ORDER — ALBUTEROL SULFATE (2.5 MG/3ML) 0.083% IN NEBU
Freq: Once | RESPIRATORY_TRACT | Status: AC
Start: 2019-11-17 — End: 2019-11-17
  Administered 2019-11-18: 2.5 mg via RESPIRATORY_TRACT

## 2019-11-17 MED FILL — ALBUTEROL SULFATE (2.5 MG/3ML) 0.083% IN NEBU: RESPIRATORY_TRACT | Qty: 3

## 2019-11-17 NOTE — Discharge Instructions (Signed)
Use the inhaler every 4 hours.  It will help to open up your lungs.  Take the doxycycline antibiotic twice a day.  Return to the emergency department if symptoms change or worsen.  Call your physician for an appointment later this week.

## 2019-11-18 LAB — COMPREHENSIVE METABOLIC PANEL
ALT: 20 U/L (ref 0–49)
AST: 35 U/L (ref 15–46)
Albumin,Serum: 3.5 g/dL (ref 3.5–5.0)
Alkaline Phosphatase: 47 U/L (ref 38–126)
Anion Gap: 4 mmol/L (ref 3–13)
BUN: 12 mg/dL (ref 7–20)
CO2: 23 mmol/L (ref 22–30)
Calcium: 8.8 mg/dL (ref 8.4–10.4)
Chloride: 108 mmol/L — ABNORMAL HIGH (ref 98–107)
Creatinine: 1.01 mg/dL (ref 0.52–1.25)
EGFR IF NonAfrican American: 84 mL/min (ref >60)
Glucose: 128 mg/dL — ABNORMAL HIGH (ref 70–100)
Potassium: 3.7 mmol/L (ref 3.5–5.1)
Sodium: 135 mmol/L (ref 135–145)
Total Bilirubin: 0.2 mg/dL (ref 0.2–1.3)
Total Protein: 6.6 g/dL (ref 6.3–8.2)
eGFR AA: >90 mL/min (ref >60)

## 2019-11-18 LAB — RBC MORPHOLOGY: RBC Morphology: ABNORMAL NA

## 2019-11-18 LAB — CBC WITH AUTO DIFFERENTIAL
Absolute Baso #: 0.1 10*3/uL (ref 0.0–0.2)
Basophils %: 1 % (ref 0.0–2.0)
Eosinophils Absolute: 0.3 10*3/uL (ref 0.0–0.5)
Eosinophils: 3.4 % (ref 1.0–6.0)
Granulocytes %: 78.3 % (ref 40.0–80.0)
Hematocrit: 29.9 % — ABNORMAL LOW (ref 40.0–52.0)
Hemoglobin: 9.4 g/dL — ABNORMAL LOW (ref 13.0–18.0)
Lymphocyte %: 7.2 % — ABNORMAL LOW (ref 20.0–40.0)
Lymphocytes Absolute: 0.6 10*3/uL — ABNORMAL LOW (ref 1.0–4.3)
MCH: 24.3 pg — ABNORMAL LOW (ref 26.0–34.0)
MCHC: 31.5 % — ABNORMAL LOW (ref 32.0–36.0)
MCV: 77.2 fL — ABNORMAL LOW (ref 80.0–98.0)
MPV: 6.9 fL — ABNORMAL LOW (ref 7.4–10.4)
Monocytes Absolute: 0.9 10*3/uL — ABNORMAL HIGH (ref 0.0–0.8)
Monocytes: 10.1 % — ABNORMAL HIGH (ref 2.0–10.0)
Neutrophils Absolute: 7 10*3/uL (ref 1.8–7.0)
Platelets: 363 10*3/uL (ref 140–440)
RBC: 3.87 10*6/uL — ABNORMAL LOW (ref 4.40–5.90)
RDW: 22.8 % — ABNORMAL HIGH (ref 11.5–14.5)
WBC: 8.9 10*3/uL (ref 3.6–10.7)

## 2019-11-18 LAB — COVID-19, RAPID

## 2019-11-18 LAB — RESPIRATORY PANEL, MOLECULAR, WITH COVID-19: Respiratory Panel Molecular, with COVID: NEGATIVE

## 2019-11-18 MED ORDER — HEPARIN SOD (PORK) LOCK FLUSH 100 UNIT/ML IV SOLN
100 UNIT/ML | INTRAVENOUS | Status: DC | PRN
Start: 2019-11-18 — End: 2019-11-17

## 2019-11-18 MED ORDER — DOXYCYCLINE HYCLATE 100 MG PO TABS
100 MG | ORAL_TABLET | Freq: Two times a day (BID) | ORAL | 0 refills | Status: AC
Start: 2019-11-18 — End: 2019-11-27

## 2019-11-18 MED ORDER — ALBUTEROL SULFATE HFA 108 (90 BASE) MCG/ACT IN AERS
108 (90 Base) MCG/ACT | Freq: Four times a day (QID) | RESPIRATORY_TRACT | 3 refills | Status: AC | PRN
Start: 2019-11-18 — End: ?

## 2019-11-18 MED ORDER — HEPARIN SOD (PORK) LOCK FLUSH 100 UNIT/ML IV SOLN
100 UNIT/ML | INTRAVENOUS | Status: AC
Start: 2019-11-18 — End: 2019-11-17
  Administered 2019-11-18: 01:00:00

## 2019-11-18 MED FILL — HEPARIN SOD (PORK) LOCK FLUSH 100 UNIT/ML IV SOLN: 100 [IU]/mL | INTRAVENOUS | Qty: 5

## 2019-12-02 IMAGING — MR MR HEAD WO/W CM
10 of 14 series · 35 of 48 positions shown · IV contrast (multihance)
Comparison: CT 05/06/2014, MRI 05/07/2013

CLINICAL DATA: Multiple sclerosis

EXAM:
MRI HEAD WITHOUT AND WITH CONTRAST
TECHNIQUE: Multiplanar, multiecho pulse sequences of the brain and surrounding
structures were obtained without and with intravenous contrast.
CONTRAST:  17mL MULTIHANCE GADOBENATE DIMEGLUMINE 529 MG/ML IV SOLN

[Series 3: DWI · coronal · 5.0mm · 0.45mm/px · 3 of 34 slices shown (1 of 4)]
[im 1/34]
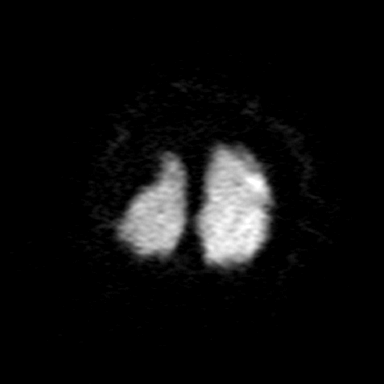
[im 17/34]
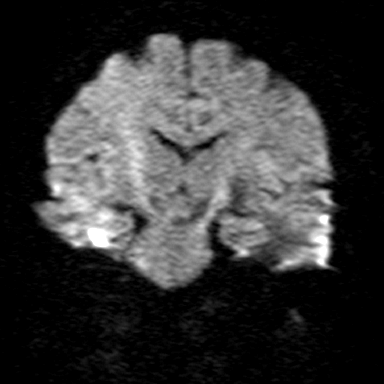
[im 34/34]
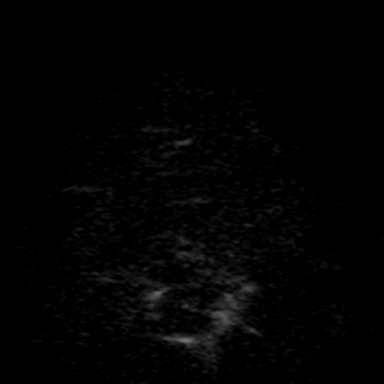

[Series 4: DWI · coronal · 5.0mm · 0.50mm/px · 3 of 34 slices shown (2 of 4)]
[im 1/34]
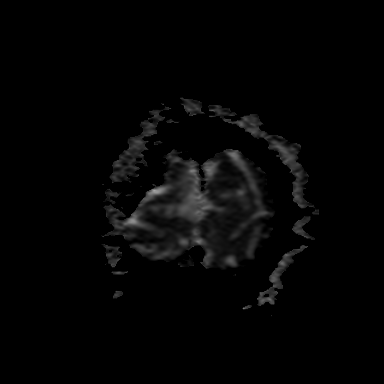
[im 17/34]
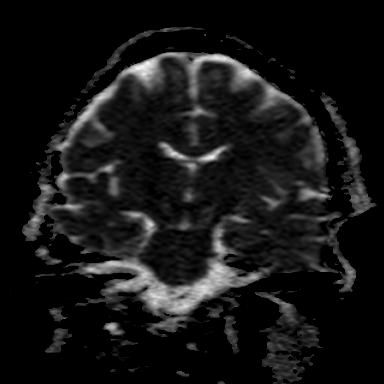
[im 34/34]
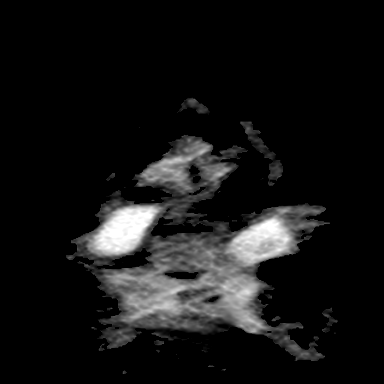

[Series 5: T2 · axial · 5.0mm · 0.68mm/px · z∈[-112,+31]mm · 2 of 23 slices shown (1 of 2)]
[im 1/23]
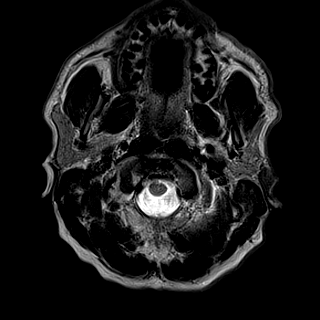
[im 23/23]
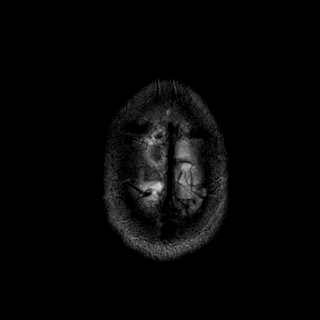

[Series 6: FLAIR · axial · 3.0mm · 0.85mm/px · z∈[-110,+28]mm · 4 of 47 slices shown]
[im 1/47]
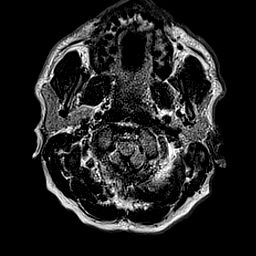
[im 16/47]
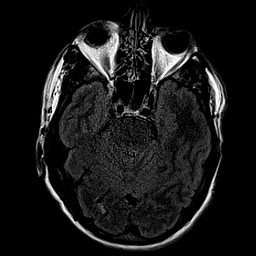
[im 31/47]
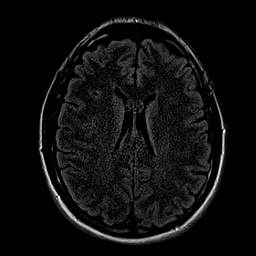
[im 47/47]
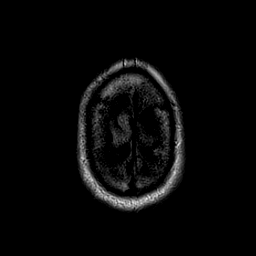

[Series 7: DWI · axial · 3.0mm · 0.71mm/px · z∈[-119,+40]mm · 5 of 54 slices shown (3 of 4)]
[im 1/54]
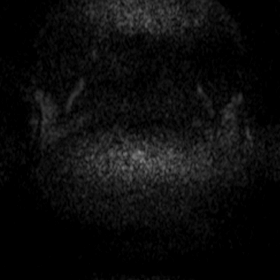
[im 14/54]
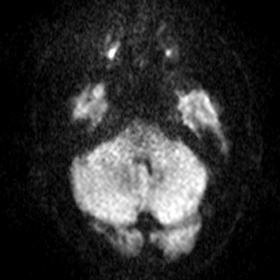
[im 27/54]
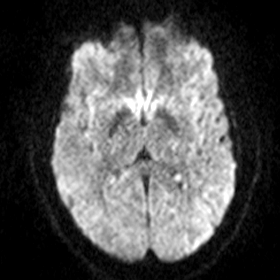
[im 40/54]
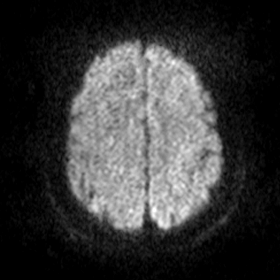
[im 54/54]
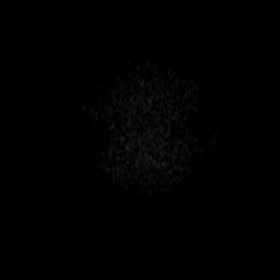

[Series 8: DWI · axial · 3.0mm · 0.76mm/px · z∈[-122,+40]mm · 5 of 55 slices shown (4 of 4)]
[im 1/55]
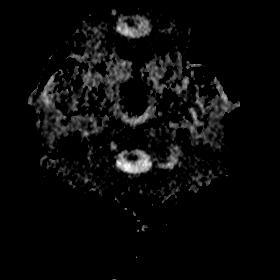
[im 14/55]
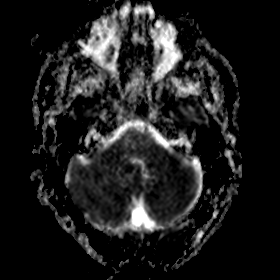
[im 28/55]
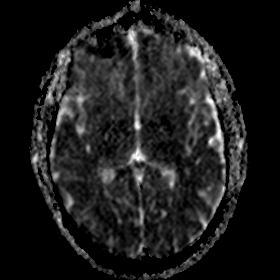
[im 41/55]
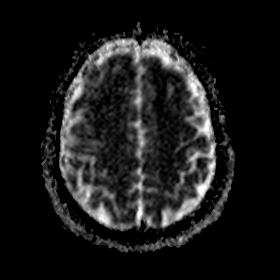
[im 55/55]
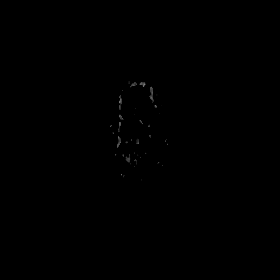

[Series 9: T1 · axial · 2.0mm · 0.44mm/px · z∈[-124,+35]mm · 7 of 81 slices shown]
[im 1/81]
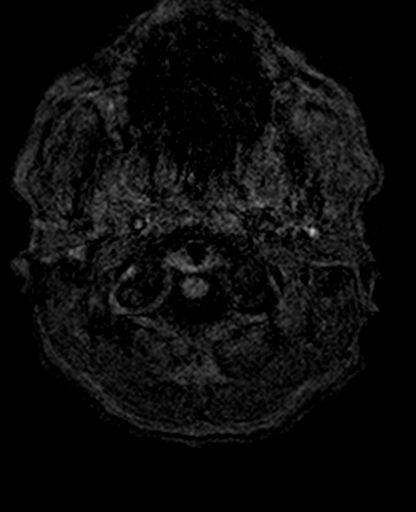
[im 14/81]
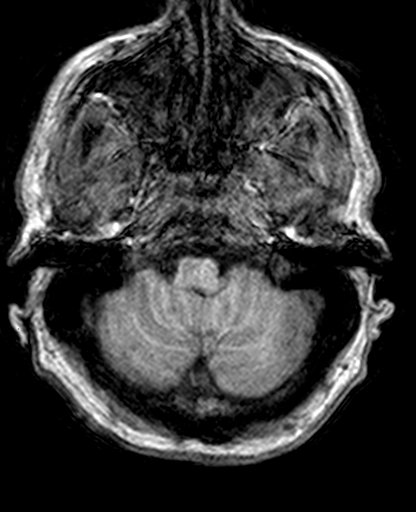
[im 27/81]
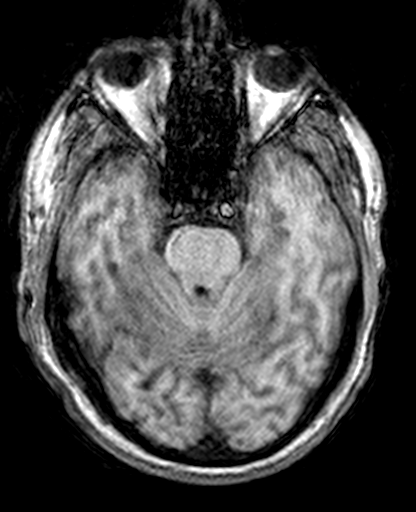
[im 41/81]
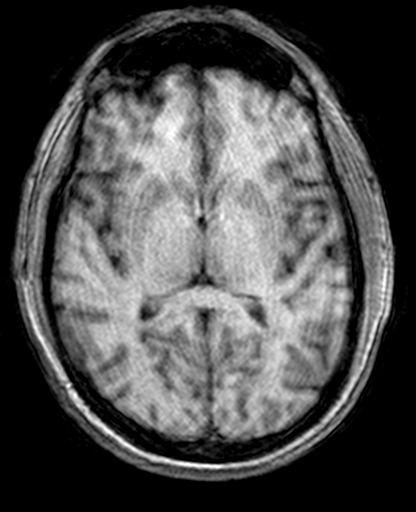
[im 54/81]
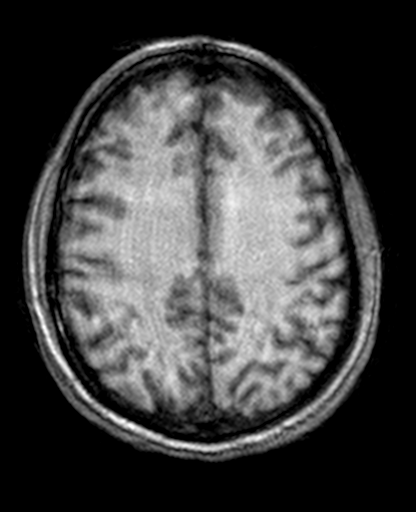
[im 67/81]
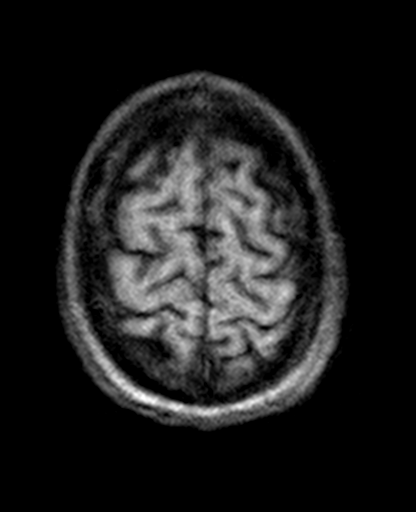
[im 81/81]
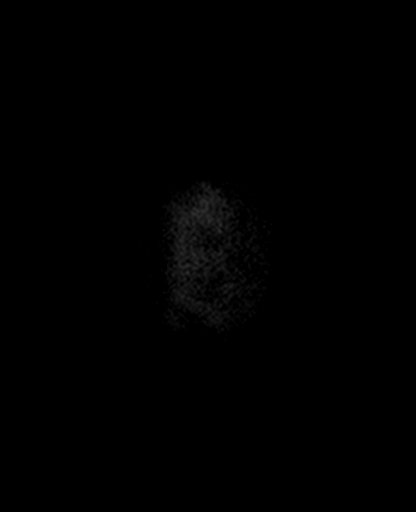

[Series 12: T2 · coronal · 5.0mm · 0.64mm/px · 2 of 28 slices shown (2 of 2)]
[im 1/28]
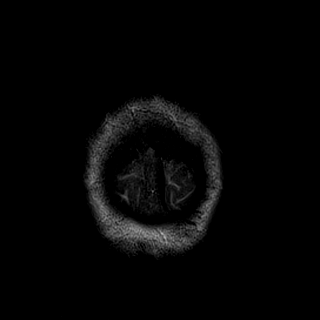
[im 28/28]
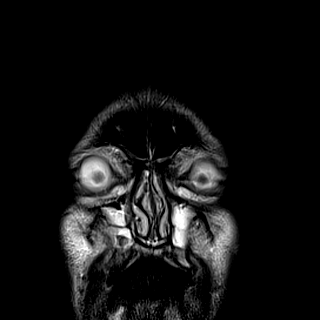

[Series 14: T1 post-contrast · coronal · 5.0mm · 0.42mm/px · 2 of 24 slices shown (1 of 2)]
[im 1/24]
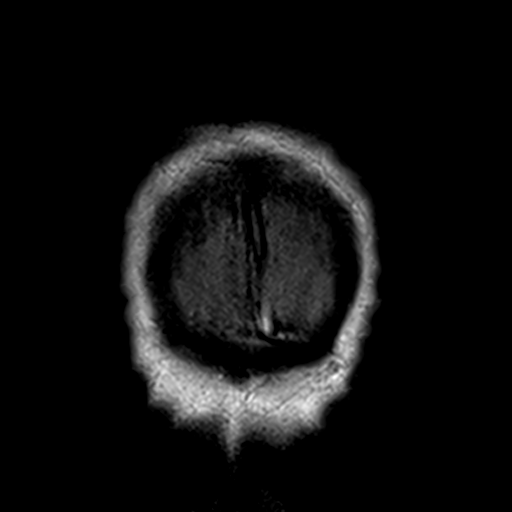
[im 24/24]
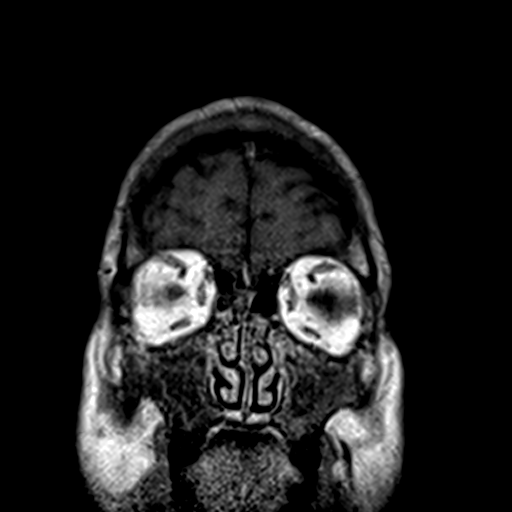

[Series 15: T1 post-contrast · sagittal · 5.0mm · 0.41mm/px · 2 of 20 slices shown (2 of 2)]
[im 1/20]
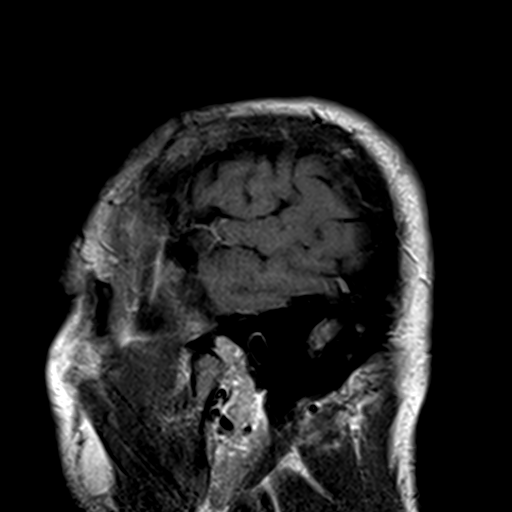
[im 20/20]
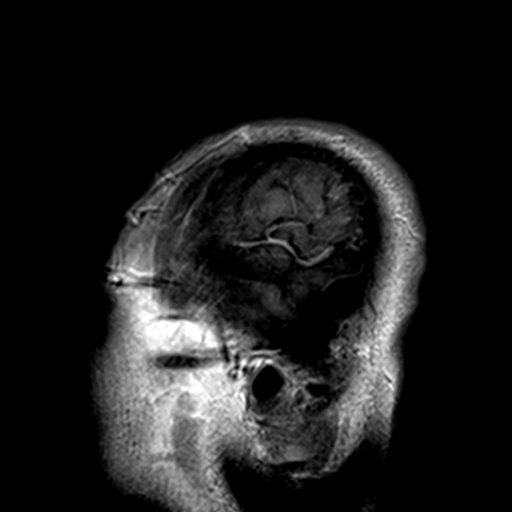

[35 of 48 positions shown; findings below may reference images not displayed]

FINDINGS: Brain: Image quality degraded by motion. The patient was
claustrophobic and motion was progressive throughout the study.

Ventricle size normal. Cerebral volume normal. Punctate
hyperintensities in the subcortical white matter bilaterally best
seen on the prior study which is of better quality. These are
obscured by motion artifact today. No new white matter lesions.
Brainstem and cerebellum and basal ganglia normal. Negative for
acute infarct. Negative for hemorrhage or mass. Cerebellar tonsils 4
mm below the foramen magnum, this is the upper limits of normal.

Normal enhancement following contrast infusion.

Vascular: Normal arterial flow voids

Skull and upper cervical spine: Negative

Sinuses/Orbits: Extensive mucosal edema throughout the paranasal
sinuses with progression from the prior study. Normal orbit. Mastoid
sinus clear bilaterally.

Other: None
IMPRESSION: Image quality degraded by motion. This obscures the small white
matter lesion seen previously. These are not typical for multiple
sclerosis. No new lesions and no acute abnormality.

Extensive mucosal edema paranasal sinuses with progression from the
prior study

Cerebellar tonsils low lying but without definite Chiari
malformation.

## 2020-06-12 ENCOUNTER — Inpatient Hospital Stay
Admit: 2020-06-12 | Discharge: 2020-06-12 | Disposition: A | Discharge status: Home / Self Care | Attending: Emergency Medicine

## 2020-06-12 DIAGNOSIS — S93402A Sprain of unspecified ligament of left ankle, initial encounter: Secondary | ICD-10-CM

## 2020-06-12 LAB — COVID-19, FLU A/B, AND RSV COMBO
Influenza A by PCR: NOT DETECTED
Influenza B by PCR: NOT DETECTED
RSV PCR: NOT DETECTED
SARS-CoV-2: NOT DETECTED

## 2020-06-12 MED ORDER — ALBUTEROL SULFATE HFA 108 (90 BASE) MCG/ACT IN AERS
108 (90 Base) MCG/ACT | Freq: Once | RESPIRATORY_TRACT | Status: AC
Start: 2020-06-12 — End: 2020-06-12
  Administered 2020-06-12: 18:00:00 via RESPIRATORY_TRACT

## 2020-06-12 MED FILL — VENTOLIN HFA 108 (90 BASE) MCG/ACT IN AERS: 108 (90 Base) MCG/ACT | RESPIRATORY_TRACT | Qty: 8

## 2020-06-12 NOTE — ED Provider Notes (Signed)
[facility] ED  EMERGENCY DEPARTMENT ENCOUNTER      Pt Name: Dennis Rivas  MRN: [id number]  Birthdate 12-14-1965  Date of evaluation: 06/12/2020  Provider: Janith Lima, MD     CHIEF COMPLAINT       Chief Complaint   Patient presents with   ??? Ankle Pain         HISTORY OF PRESENT ILLNESS   (Location/Symptom, Timing/Onset, Context/Setting, Quality, Duration, Modifying Factors, Severity) Note limiting factors.   I wore a KN95 mask for the entirety of this encounter.    Does this patient come from an ECF, SNF, Rehab, Group Home or other Congregate setting: No  (If yes to above patient needs a Covid-19 test)    HPI    Dennis Rivas is a 55 y.o. male who presents to the emergency department with left ankle pain after he suffered an eversion injury while walking 4 days ago.  He has had continued pain and swelling 6 out of 10 worse with ambulation.  He has no paresis or paresthesias he did not fall and has no injury to his neck back chest abdomen or pelvis.  Patient has a history of multiple sclerosis and Crohn's disease and is on Stelara.  He was a former smoker.  She had a 2-week history of cough worse at night.  He has not been vaccinated for COVID.  Denies fever loss of taste or smell.  Does have some shortness of breath    Nursing Notes were reviewed.    REVIEW OF SYSTEMS    (2+ for level 4; 10+ for level 5)   Review of Systems   Constitutional: Negative for fatigue and fever.   HENT: Negative for sinus pressure and sore throat.    Eyes: Negative for discharge and visual disturbance.   Respiratory: Positive for cough, chest tightness and shortness of breath.    Cardiovascular: Negative for chest pain and palpitations.   Gastrointestinal: Negative for abdominal pain, nausea and vomiting.   Genitourinary: Negative for dysuria and hematuria.   Musculoskeletal: Positive for arthralgias, gait problem and joint swelling. Negative for back pain and neck pain.   Skin: Negative for rash and wound.   Neurological:  Negative for speech difficulty, weakness and numbness.   Psychiatric/Behavioral: Negative for behavioral problems and confusion.       PAST MEDICAL HISTORY     Past Medical History:   Diagnosis Date   ??? Anemia    ??? Arthritis    ??? Crohn's disease (HCC)    ??? Fibromyalgia    ??? Hypertension    ??? Multiple sclerosis (HCC)        SURGICAL HISTORY       Past Surgical History:   Procedure Laterality Date   ??? ABDOMEN SURGERY     ??? APPENDECTOMY     ??? HERNIA REPAIR     ??? KNEE SURGERY     ??? SMALL INTESTINE SURGERY         CURRENT MEDICATIONS       Previous Medications    ACETAMINOPHEN (TYLENOL) 325 MG TABLET    Take 2 tablets by mouth every 4 hours as needed for Pain    ALBUTEROL SULFATE HFA (PROVENTIL HFA) 108 (90 BASE) MCG/ACT INHALER    Inhale 2 puffs into the lungs every 6 hours as needed for Wheezing    AMPHETAMINE-DEXTROAMPHETAMINE (ADDERALL, ,) 10 MG TABLET    Take 10 mg by mouth 2 times daily    CHLORTHALIDONE (  HYGROTON) 25 MG TABLET    Take 12.5 mg by mouth daily    CODEINE 30 MG TABLET    Take 30 mg by mouth every 6 hours as needed for Pain .    DIPHENOXYLATE-ATROPINE (LOMOTIL) 2.5-0.025 MG PER TABLET    Take 1 tablet by mouth 4 times daily as needed for Diarrhea    ERGOCALCIFEROL (ERGOCALCIFEROL) 50000 UNITS CAPSULE    Take 50,000 Units by mouth once a week    FERROUS SULFATE 325 (65 FE) MG TABLET    Take 325 mg by mouth daily (with breakfast)    LOPERAMIDE (IMODIUM) 2 MG CAPSULE    Take 2 mg by mouth 4 times daily as needed for Diarrhea    MULTIPLE VITAMINS-MINERALS (THERAPEUTIC MULTIVITAMIN-MINERALS) TABLET    Take 1 tablet by mouth daily    OMEPRAZOLE (PRILOSEC) 20 MG DELAYED RELEASE CAPSULE    Take 20 mg by mouth daily    ONDANSETRON (ZOFRAN) 4 MG TABLET    Take 1 tablet by mouth every 6 hours as needed for Nausea or Vomiting    OPIUM TINCTURE, PAREGORIC, PO    Take by mouth    OXYCODONE 5 MG CAPSULE    Take 5 mg by mouth every 4 hours as needed for Pain .    PREDNISONE (DELTASONE) 10 MG TABLET    Prednisone  taper: 30mg  X 1week, 25mg  X1 week, 20mg  X1 week, 15mg  X1week, 10mg X1 week, 5mg  X1week    PREGABALIN (LYRICA) 150 MG CAPSULE    Take 150 mg by mouth 2 times daily    PROMETHAZINE (PHENERGAN) 25 MG TABLET    Take 25 mg by mouth every 6 hours as needed for Nausea    PSYLLIUM (KONSYL) 28.3 % PACK    Take 1 packet by mouth daily    USTEKINUMAB (STELARA) 90 MG/ML SOSY PREFILLED SYRINGE    INJECT 90 MG (1 SYRINGE) SUBCUTANEOUSLY EVERY 8 WEEKS.    VEDOLIZUMAB (ENTYVIO IV)    Infuse intravenously Indications: every other month. last infusioon 09/23/14       ALLERGIES     Hydrochlorothiazide, Fish-derived products, and Morphine    FAMILY HISTORY     History reviewed. No pertinent family history.     SOCIAL HISTORY       Social History     Socioeconomic History   ??? Marital status: Single     Spouse name: None   ??? Number of children: None   ??? Years of education: None   ??? Highest education level: None   Occupational History   ??? None   Tobacco Use   ??? Smoking status: Former Smoker     Types: Cigars   ??? Smokeless tobacco: Never Used   Substance and Sexual Activity   ??? Alcohol use: No   ??? Drug use: Yes     Types: Marijuana )   ??? Sexual activity: None   Other Topics Concern   ??? None   Social History Narrative   ??? None     Social Determinants of Health     Financial Resource Strain:    ??? Difficulty of Paying Living Expenses: Not on file   Food Insecurity:    ??? Worried About in the Last Year: Not on file   ??? Ran Out of Food in the Last Year: Not on file   Transportation Needs:    ??? Lack of Transportation (Medical): Not on file   ??? Lack of Transportation (Non-Medical): Not on  file   Physical Activity:    ??? Days of Exercise per Week: Not on file   ??? Minutes of Exercise per Session: Not on file   Stress:    ??? Feeling of Stress : Not on file   Social Connections:    ??? Frequency of Communication with Friends and Family: Not on file   ??? Frequency of Social Gatherings with Friends and Family: Not on file   ??? Attends  Religious Services: Not on file   ??? Active Member of Clubs or Organizations: Not on file   ??? Attends Banker Meetings: Not on file   ??? Marital Status: Not on file   Intimate Partner Violence:    ??? Fear of Current or Ex-Partner: Not on file   ??? Emotionally Abused: Not on file   ??? Physically Abused: Not on file   ??? Sexually Abused: Not on file   Housing Stability:    ??? Unable to Pay for Housing in the Last Year: Not on file   ??? Number of Places Lived in the Last Year: Not on file   ??? Unstable Housing in the Last Year: Not on file       SCREENINGS           PHYSICAL EXAM    (up to 7 for level 4, 8 or more for level 5)     ED Triage Vitals [06/12/20 1200]   BP Temp Temp Source Pulse Resp SpO2 Height Weight   (!) 158/92 98 ??F (36.7 ??C) Oral 81 12 98 % 6\' 1"  (1.854 m) 195 lb (88.5 kg)       Physical Exam  Vitals and nursing note reviewed.   Constitutional:       General: He is not in acute distress.     Appearance: He is well-developed.   HENT:      Head: Normocephalic and atraumatic.      Nose: Nose normal. No rhinorrhea.      Mouth/Throat:      Mouth: Mucous membranes are moist.      Pharynx: No posterior oropharyngeal erythema.   Eyes:      General:         Right eye: No discharge.         Left eye: No discharge.      Extraocular Movements: Extraocular movements intact.      Conjunctiva/sclera: Conjunctivae normal.      Pupils: Pupils are equal, round, and reactive to light.   Cardiovascular:      Rate and Rhythm: Normal rate and regular rhythm.   Pulmonary:      Effort: No respiratory distress.      Breath sounds: Wheezing present.      Comments: There is diffuse expiratory wheezing throughout  Abdominal:      General: There is no distension.      Palpations: Abdomen is soft.      Tenderness: There is no abdominal tenderness. There is no rebound.   Musculoskeletal:         General: Swelling, tenderness and signs of injury present. Normal range of motion.      Cervical back: Normal range of motion and neck  supple.      Comments: There is marked swelling to the lateral malleolus with inferior and anterior tenderness to the lateral malleolus.  There is normal range of motion.  There is normal sensation pulses capillary refill and range of motion distally to the toes   Skin:  General: Skin is warm and dry.      Capillary Refill: Capillary refill takes less than 2 seconds.      Findings: No rash.   Neurological:      General: No focal deficit present.      Mental Status: He is alert and oriented to person, place, and time.      Sensory: No sensory deficit.      Motor: No weakness.   Psychiatric:         Mood and Affect: Mood normal.         Behavior: Behavior normal.         DIAGNOSTIC RESULTS     EKG (Per Emergency Physician):       RADIOLOGY (Per Emergency Physician):   Chest x-ray notes no infiltrate or effusions    Left ankle notes no acute fractures or soft tissue swelling    Interpretation per the Radiologist below, if available at the time of this note:  No results found.    ED BEDSIDE ULTRASOUND:   Performed by ED Physician - none    LABS:  Labs Reviewed   COVID-19, FLU A/B, AND RSV COMBO        All other labs were within normal range or not returned as of this dictation.    EMERGENCY DEPARTMENT COURSE and DIFFERENTIAL DIAGNOSIS/MDM:   Vitals:    Vitals:    06/12/20 1200   BP: (!) 158/92   Pulse: 81   Resp: 12   Temp: 98 ??F (36.7 ??C)   TempSrc: Oral   SpO2: 98%   Weight: 88.5 kg (195 lb)   Height: 6\' 1"  (1.854 m)       Medications   albuterol sulfate HFA 108 (90 Base) MCG/ACT inhaler 2 puff (has no administration in time range)       MDM.  This patient who had an eversion injury to the left ankle with significant swelling and tenderness to the lateral left lateral malleolus.  We will get an x-ray to evaluate for fracture.  Patient is also immunosuppressed not had his Covid vaccine and is wheezing.  Given albuterol inhaler get a chest x-ray and Covid test    REVAL:     Reexam 1320: After doing the albuterol MDI  patient feels much better and wheezing is diminished on exam. Covid test is negative. X-ray of the ankle and chest x-ray notes no pneumonia or fracture. Placement orthotic boot elevate ice and use Tylenol. He can follow-up with his primary care physician may need physical therapy. Gait we will continue to use albuterol inhaler for the next week until his cough resolves  CRITICAL CARE TIME   Total Critical Care time was 0 minutes, excluding separately reportable procedures.  There was a high probability of clinically significant/life threatening deterioration in the patient's condition which required my urgent intervention.     CONSULTS:  None    PROCEDURES:  Unless otherwise noted below, none     Procedures    FINAL IMPRESSION    No diagnosis found.      DISPOSITION/PLAN   DISPOSITION        PATIENT REFERRED TO:  No follow-up provider specified.    DISCHARGE MEDICATIONS:  New Prescriptions    No medications on file          (Please note:  Portions of this note were completed with a voice recognition program.  Efforts were made to edit the dictations but occasionally words and phrases are mis-transcribed.)  Form v2016.J.5-cn    Janith Limaavid C Anthonella Klausner, MD (electronically signed)  Emergency Medicine Provider        Janith Limaavid C Arie Powell, MD  06/12/20 [phone]

## 2020-06-12 NOTE — ED Triage Notes (Signed)
Pt ambulatory to ED3 with c/o left ankle pain. Pt states he slipped and twisted his ankle on Tuesday.     Pt is A&Ox3, respirations even and unlabored, skin warm and dry, no distress noted. Pain at present is rated 4/10. Pt took Tylenol at 1000.

## 2020-06-12 NOTE — Discharge Instructions (Signed)
Elevate the ankle and foot is much as possible. He can use Advil Aleve or Tylenol for pain    Use your albuterol inhaler 2 puffs every 4-6 hours as needed for cough.    Return the emergency department if increased shortness of breath cough or fever    See your primary care physician of ankle still cause new problems or pain in 7 to 10 days

## 2020-06-12 NOTE — Other (Unsigned)
Patient Acct Nbr: [id number]   Primary AUTH/CERT:   Primary Insurance Company Name: [employer] Plan name: Orpah Rivas  Primary Insurance Group Number: [id number]  Primary Insurance Plan Type: Health  Primary Insurance Policy Number: [id number]

## 2020-07-04 ENCOUNTER — Inpatient Hospital Stay
Admit: 2020-07-04 | Discharge: 2020-07-04 | Disposition: A | Discharge status: Home / Self Care | Attending: Emergency Medicine

## 2020-07-04 DIAGNOSIS — L03012 Cellulitis of left finger: Secondary | ICD-10-CM

## 2020-07-04 MED ORDER — BACITRACIN 500 UNIT/GM EX OINT
500 UNIT/GM | Freq: Once | CUTANEOUS | Status: AC
Start: 2020-07-04 — End: 2020-07-04
  Administered 2020-07-04: 22:00:00 via TOPICAL

## 2020-07-04 MED ORDER — OXYCODONE-ACETAMINOPHEN 5-325 MG PO TABS
5-325 MG | ORAL_TABLET | Freq: Four times a day (QID) | ORAL | 0 refills | Status: AC | PRN
Start: 2020-07-04 — End: 2020-07-07

## 2020-07-04 MED ORDER — CEPHALEXIN 500 MG PO CAPS
500 MG | ORAL_CAPSULE | Freq: Four times a day (QID) | ORAL | 0 refills | Status: AC
Start: 2020-07-04 — End: 2020-07-11

## 2020-07-04 MED ORDER — LIDOCAINE HCL (PF) 1 % IJ SOLN
1 % | Freq: Once | INTRAMUSCULAR | Status: AC
Start: 2020-07-04 — End: 2020-07-04
  Administered 2020-07-04: 22:00:00 via INTRADERMAL

## 2020-07-04 MED FILL — BACITRACIN 500 UNIT/GM EX OINT: 500 UNIT/GM | CUTANEOUS | Qty: 14

## 2020-07-04 MED FILL — XYLOCAINE-MPF 1 % IJ SOLN: 1 % | INTRAMUSCULAR | Qty: 30

## 2020-07-04 NOTE — ED Notes (Signed)
Wound care completed to left hand with bacitracin, telfa and gauze wrap. Patient tolerated well. Discharge instructions given to patient with patient verbalizing understanding.      Rudy Jew, RN  07/04/20 [phone]

## 2020-07-04 NOTE — ED Provider Notes (Signed)
[facility] ED  EMERGENCY DEPARTMENT ENCOUNTER      Pt Name: Dennis Rivas  MRN: [id number]  Birthdate 04/05/1966  Date of evaluation: 07/04/2020  Provider: Lidia Collum, MD     CHIEF COMPLAINT       Chief Complaint   Patient presents with   ??? Hand Pain     History from patient    HISTORY OF PRESENT ILLNESS   (Location/Symptom, Timing/Onset, Context/Setting, Quality, Duration, Modifying Factors, Severity) Note limiting factors.   I wore a Face shield over a surgical mask over an N95   mask for the entirety of this encounter.    Does this patient come from an ECF, SNF, Rehab, Group Home or other Congregate setting: no  (If yes to above patient needs a Covid-19 test)    HPI    Dennis Rivas is a 55 y.o. male who presents to the emergency department complaining of pain in his right index finger and has left long finger.  Patient states he was stacking wood 2 days ago and noticed a blister on his left 3rd finger.  He says he popped it and has had drainage since.  He woke up today and it is swollen and red.  Pain is dull moderate constant aching without radiation touching either side makes it worse nothing makes it better.  No drainage from his right index finger.  No previous injury to either.  Patient is right-hand dominant.  Tetanus immunization up-to-date    Nursing Notes were reviewed.    REVIEW OF SYSTEMS    (2+ for level 4; 10+ for level 5)   Review of Systems   Constitutional: Negative for activity change, chills, fatigue and fever.   HENT: Negative for congestion, ear pain, rhinorrhea and sore throat.    Eyes: Negative for pain, discharge and itching.   Respiratory: Negative for cough, choking and shortness of breath.    Cardiovascular: Negative for chest pain, palpitations and leg swelling.   Gastrointestinal: Negative for diarrhea, nausea and vomiting.   Genitourinary: Negative for dysuria, frequency and hematuria.   Musculoskeletal: Negative for gait problem, joint swelling, myalgias, neck pain  and neck stiffness.   Skin: Positive for color change and wound. Negative for pallor and rash.   Allergic/Immunologic: Negative for immunocompromised state.   Neurological: Negative for tremors, speech difficulty and numbness.       PAST MEDICAL HISTORY     Past Medical History:   Diagnosis Date   ??? Anemia    ??? Arthritis    ??? Crohn's disease (HCC)    ??? Fibromyalgia    ??? Hypertension    ??? Multiple sclerosis (HCC)        SURGICAL HISTORY       Past Surgical History:   Procedure Laterality Date   ??? ABDOMEN SURGERY     ??? APPENDECTOMY     ??? HERNIA REPAIR     ??? KNEE SURGERY     ??? SMALL INTESTINE SURGERY         CURRENT MEDICATIONS       Previous Medications    ACETAMINOPHEN (TYLENOL) 325 MG TABLET    Take 2 tablets by mouth every 4 hours as needed for Pain    ALBUTEROL SULFATE HFA (PROVENTIL HFA) 108 (90 BASE) MCG/ACT INHALER    Inhale 2 puffs into the lungs every 6 hours as needed for Wheezing    AMPHETAMINE-DEXTROAMPHETAMINE (ADDERALL, 10MG ,) 10 MG TABLET    Take 10 mg by mouth  2 times daily    CHLORTHALIDONE (HYGROTON) 25 MG TABLET    Take 12.5 mg by mouth daily    CODEINE 30 MG TABLET    Take 30 mg by mouth every 6 hours as needed for Pain .    DIPHENOXYLATE-ATROPINE (LOMOTIL) 2.5-0.025 MG PER TABLET    Take 1 tablet by mouth 4 times daily as needed for Diarrhea    ERGOCALCIFEROL (ERGOCALCIFEROL) 50000 UNITS CAPSULE    Take 50,000 Units by mouth once a week    FERROUS SULFATE 325 (65 FE) MG TABLET    Take 325 mg by mouth daily (with breakfast)    LOPERAMIDE (IMODIUM) 2 MG CAPSULE    Take 2 mg by mouth 4 times daily as needed for Diarrhea    MULTIPLE VITAMINS-MINERALS (THERAPEUTIC MULTIVITAMIN-MINERALS) TABLET    Take 1 tablet by mouth daily    OMEPRAZOLE (PRILOSEC) 20 MG DELAYED RELEASE CAPSULE    Take 20 mg by mouth daily    ONDANSETRON (ZOFRAN) 4 MG TABLET    Take 1 tablet by mouth every 6 hours as needed for Nausea or Vomiting    OPIUM TINCTURE, PAREGORIC, PO    Take by mouth    OXYCODONE 5 MG CAPSULE    Take 5 mg  by mouth every 4 hours as needed for Pain .    PREDNISONE (DELTASONE) 10 MG TABLET    Prednisone taper: 30mg  X 1week, 25mg  X1 week, 20mg  X1 week, 15mg  X1week, 10mg X1 week, 5mg  X1week    PREGABALIN (LYRICA) 150 MG CAPSULE    Take 150 mg by mouth 2 times daily    PROMETHAZINE (PHENERGAN) 25 MG TABLET    Take 25 mg by mouth every 6 hours as needed for Nausea    PSYLLIUM (KONSYL) 28.3 % PACK    Take 1 packet by mouth daily    USTEKINUMAB (STELARA) 90 MG/ML SOSY PREFILLED SYRINGE    INJECT 90 MG (1 SYRINGE) SUBCUTANEOUSLY EVERY 8 WEEKS.    VEDOLIZUMAB (ENTYVIO IV)    Infuse intravenously Indications: every other month. last infusioon 09/23/14       ALLERGIES     Hydrochlorothiazide, Fish-derived products, Morphine, and Ketorolac    FAMILY HISTORY     History reviewed. No pertinent family history.   Crohn's     SOCIAL HISTORY     Disabled because of his Crohn's  Non-smoker   nondrinker  Occasional marijuana otherwise no street drugs    Social History     Socioeconomic History   ??? Marital status: Single     Spouse name: None   ??? Number of children: None   ??? Years of education: None   ??? Highest education level: None   Occupational History   ??? None   Tobacco Use   ??? Smoking status: Former Smoker     Types: Cigars   ??? Smokeless tobacco: Never Used   Substance and Sexual Activity   ??? Alcohol use: No   ??? Drug use: Yes     Types: Marijuana Sheran Fava(Weed)   ??? Sexual activity: None   Other Topics Concern   ??? None   Social History Narrative   ??? None     Social Determinants of Health     Financial Resource Strain:    ??? Difficulty of Paying Living Expenses: Not on file   Food Insecurity:    ??? Worried About Programme researcher, broadcasting/film/videounning Out of Food in the Last Year: Not on file   ??? Ran Out of Food in the  Last Year: Not on file   Transportation Needs:    ??? Lack of Transportation (Medical): Not on file   ??? Lack of Transportation (Non-Medical): Not on file   Physical Activity:    ??? Days of Exercise per Week: Not on file   ??? Minutes of Exercise per Session: Not on file    Stress:    ??? Feeling of Stress : Not on file   Social Connections:    ??? Frequency of Communication with Friends and Family: Not on file   ??? Frequency of Social Gatherings with Friends and Family: Not on file   ??? Attends Religious Services: Not on file   ??? Active Member of Clubs or Organizations: Not on file   ??? Attends Banker Meetings: Not on file   ??? Marital Status: Not on file   Intimate Partner Violence:    ??? Fear of Current or Ex-Partner: Not on file   ??? Emotionally Abused: Not on file   ??? Physically Abused: Not on file   ??? Sexually Abused: Not on file   Housing Stability:    ??? Unable to Pay for Housing in the Last Year: Not on file   ??? Number of Places Lived in the Last Year: Not on file   ??? Unstable Housing in the Last Year: Not on file       SCREENINGS    Glasgow Coma Scale  Eye Opening: Spontaneous  Best Verbal Response: Oriented  Best Motor Response: Obeys commands  Glasgow Coma Scale Score: 15      PHYSICAL EXAM    (up to 7 for level 4, 8 or more for level 5)     ED Triage Vitals   BP Temp Temp src Pulse Resp SpO2 Height Weight   -- -- -- -- -- -- -- --       Physical Exam  Constitutional:       Appearance: He is well-developed. He is not diaphoretic.   HENT:      Head: Normocephalic and atraumatic.   Eyes:      General:         Right eye: No discharge.         Left eye: No discharge.      Conjunctiva/sclera: Conjunctivae normal.      Pupils: Pupils are equal, round, and reactive to light.   Neck:      Vascular: No JVD.      Trachea: No tracheal deviation.   Cardiovascular:      Rate and Rhythm: Normal rate and regular rhythm.      Heart sounds: Normal heart sounds. No murmur heard.      Pulmonary:      Effort: Pulmonary effort is normal. No respiratory distress.      Breath sounds: No stridor. No wheezing or rales.   Abdominal:      General: There is no distension.      Palpations: Abdomen is soft.      Tenderness: There is no abdominal tenderness. There is no rebound.   Musculoskeletal:          General: No tenderness or deformity. Normal range of motion.      Cervical back: Normal range of motion and neck supple.   Skin:     General: Skin is warm and dry.   Neurological:      General: No focal deficit present.      Mental Status: He is alert and oriented to person, place,  and time.      Cranial Nerves: No cranial nerve deficit.      Motor: No weakness.      Coordination: Coordination normal.     Not febrile not toxic  Right hand -raised white area over ulnar aspect of the distal phalanx of his index finger, no erythema or fluctuance, full range of motion  Left hand -swelling ecchymosis and erythema over the extensor surface of the nailbed of his 3rd finger, full range of motion    DIAGNOSTIC RESULTS     RADIOLOGY (Per Emergency Physician):   Right hand x-ray - no fracture  Left hand x-ray - no fracture    Interpretation per the Radiologist below, if available at the time of this note:  XR Hand Standard Bilateral    Result Date: 07/04/2020  Patient Name:                DATON, SZILAGYI MRN:                         [id number] Acct#:                       [id number] Diagnostic Radiology ACCESSION                 EXAM DATE/TIME           PROCEDURE                 ORDERING PROVIDER [phone]             07/04/2020 16:21 EST       CR Hand Complete 3+       5796 -Eliodoro Gullett Views Bilateral CPT code 57846 Reason For Exam (CR Hand Complete 3+ Views Bilateral) rt injury to distal index , injury to left 3rd finger nail bed Report CLINICAL INDICATION:  Status post injury to right and left hand. TECHNIQUE: 3 views of the right hand and 3 views of the left hand COMPARISON:  None FINDINGS/IMPRESSION: Limitations: No significant limitations. Right hand: No acute fracture or dislocation. 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 No subcutaneous emphysema or radiopaque foreign body. Left hand: No acute fracture or dislocation. Mild osteoarthritis at the first [facility] joint. Asymmetric soft tissue swelling  involving the distal third digit most pronounced dorsally. Diffuse soft tissue swelling involving the dorsal aspect of the left hand. No subcutaneous emphysema. 2 mm metallic density along the lateral/radial margin of the second MCP joint likely a retained foreign body. Report Dictated on Workstation: [email or web address] ---** Final ---** Dictating Physician: Estrella Myrtle, MD, VLADIMIR Signed Date and Time: 07/04/2020 4:43 pm Signed by: Estrella Myrtle, MD, VLADIMIR Transcribed Date and Time: 07/04/2020 4:44        EMERGENCY DEPARTMENT COURSE and DIFFERENTIAL DIAGNOSIS/MDM:   Vitals:    Vitals:    07/04/20 1554   BP: (!) 168/89   Pulse: 99   Resp: 16   Temp: 98.4 ??F (36.9 ??C)   TempSrc: Oral   SpO2: 99%   Weight: 94.8 kg (209 lb)   Height: 6' (1.829 m)       Medications   lidocaine PF 1 % injection 10 mL (has no administration in time range)   bacitracin ointment (has no administration in time range)       MDM.    55 year old male complaining of pain to his right index and left long finger.  Occurred after he was lifting wood and there is a  possibility that he has a wood foreign body that did not show up on radiographs.  Patient aware of that.  Symptoms improved when drained his paronychia on the left hand.  He will do local care on the right index finger.  Would begin him on antibiotics and analgesics.  His immunizations and tetanus is up-to-date.  He has access to follow-up and agrees with plan.    REVAL:   4:53 PM EST All data now available and reviewed with patient. Digital block performed.      PROCEDURES:  Unless otherwise noted below, none     Incision/Drainage    Date/Time: 07/04/2020 5:21 PM  Performed by: Lidia Collum, MD  Authorized by: Lidia Collum, MD     Consent:     Consent obtained:  Verbal    Consent given by:  Patient    Risks discussed:  Bleeding, infection and pain    Alternatives discussed:  No treatment  Location:     Type:  Abscess    Location:  Upper extremity    Upper extremity location:  Finger     Finger location:  L long finger  Pre-procedure details:     Skin preparation:  Antiseptic wash  Anesthesia (see MAR for exact dosages):     Anesthesia method:  Nerve block    Block location:  Digital    Block needle gauge:  25 G    Block anesthetic:  Lidocaine 1% w/o epi    Block injection procedure:  Anatomic landmarks identified, anatomic landmarks palpated, introduced needle, negative aspiration for blood and incremental injection    Block outcome:  Anesthesia achieved  Procedure details:     Incision types:  Single straight    Incision depth:  Dermal    Wound management:  Probed and deloculated    Drainage:  Purulent    Drainage amount:  Scant    Wound treatment:  Wound left open    Packing materials:  None  Post-procedure details:     Patient tolerance of procedure:  Tolerated well, no immediate complications  Comments:      Paronychia with 1 cm pus        FINAL IMPRESSION      1. Paronychia of finger of left hand          DISPOSITION/PLAN   DISPOSITION        PATIENT REFERRED TO:  Elvera Lennox, DO  [address]  [place] [address]  [phone]    In 1 week        DISCHARGE MEDICATIONS:  New Prescriptions    CEPHALEXIN (KEFLEX) 500 MG CAPSULE    Take 1 capsule by mouth 4 times daily for 7 days    OXYCODONE-ACETAMINOPHEN (PERCOCET) 5-325 MG PER TABLET    Take 1 tablet by mouth every 6 hours as needed for Pain for up to 3 days. Intended supply: 3 days. Take lowest dose possible to manage pain          (Please note:  Portions of this note were completed with a voice recognition program.  Efforts were made to edit the dictations but occasionally words and phrases are mis-transcribed.)  Form v2016.J.5-cn    Lidia Collum, MD (electronically signed)  Emergency Medicine Provider        Lidia Collum, MD  07/04/20 [phone]

## 2020-07-04 NOTE — ED Triage Notes (Signed)
Patient to room 5 with c/o left middle finger abscess, swelling noted to finger and left hand that started on Friday. Patient also has an abscess on right index finger. Patient stated he was stacking wood on Friday and felt something on his hands and noticed a little bleeding. Patient stated he had an abscess on his left middle finger which he drained. Resting in bed with call light within reach.

## 2020-07-04 NOTE — Other (Unsigned)
Patient Acct Nbr: [id number]   Primary AUTH/CERT:   Primary Insurance Company Name: Artist Insurance Plan name: Orpah Clinton  Primary Insurance Group Number: [id number]  Primary Insurance Plan Type: Health  Primary Insurance Policy Number: [id number]

## 2020-11-15 DIAGNOSIS — R1032 Left lower quadrant pain: Secondary | ICD-10-CM

## 2020-11-15 NOTE — ED Provider Notes (Signed)
Emergency Department Encounter  [facility] ED    Patient: Dennis Rivas  MRN: [id number]  DOB: 02-25-1966  Date of Evaluation: 11/15/2020  ED Provider: Tomasa Rand, MD    Note: I wore an N95 mask and gloves during this encounter.    CHIEF COMPLAINT: Abdominal pain    HPI:    Dennis Rivas is a 55 y.o. male with PMH including Crohn's disease, multiple abdominal surgeries including appendectomy, fibromyalgia, presents with concern for abdominal pain.  Patient reports for 2 weeks has had pain involving the left lower quadrant of his abdomen, describes sharp pain, constant, denies alleviating factors, he endorses associated nausea vomiting, is watery bowel movements including 15 episodes of diarrhea today, he denies fever, endorses chills, denies chest pain, shortness of breath, scrotal swelling or testicular pain, dysuria hematuria urinary frequency.    REVIEW OF SYSTEMS:  10 systems reviewed and otherwise acutely negative except as per HPI.    HISTORIES:  PAST MEDICAL HISTORY:  as per HPI  SOCIAL HISTORY: History of tobacco use, marijuana use, no alcohol use  MEDICATIONS:  Nursing notes and EMR reviewed  ALLERGIES:  Nursing notes and EMR reviewed    PHYSICAL EXAM:   Vital signs:  reviewed  General:  no apparent distress, nontoxic appearing  Eyes:  no conjunctival injection, eyes tracking  HEENT:  airway patent, mucous membranes moist  Cardiovascular:  regular rhythm, normal rate, symmetrical radial pulses femoral pulses palpable  Respiratory:  non-labored breathing, breath sounds clear  Gastrointestinal:  soft, non-distended, multiple healed surgical scars, mild tenderness to palpation right lower quadrant, moderate tenderness to palpation left lower quadrant, no tenderness throughout the upper abdomen, no rigidity or guarding   GU:  Testicles non-tender  No penile lesions noted, no urethral discharge  Scrotum without induration, erythema or edema  No perineal tenderness or palpable  crepitus  Extremities:  no obvious deformity, non edematous  Integumentary:  warm, dry  Neurologic:  alert, no obvious neurologic deficits    Medications   HYDROmorphone (DILAUDID) injection 1 mg (has no administration in time range)   ondansetron (ZOFRAN) injection 4 mg (has no administration in time range)   0.9 % sodium chloride bolus (has no administration in time range)       MEDICAL DECISION MAKING:    Dennis Rivas is a 55 y.o. male who presents as above, with left lower quadrant abdominal pain, history of Crohn's and multiple abdominal surgeries, presentation concerning for Crohn's flare, diverticulitis, intra-abdominal abscess or other Crohn's complication.  GU exam benign.  Discussed with patient, will obtain work-up including CT abdomen pelvis, laboratory evaluation, urinalysis.  Patient reports history of allergy to morphine, will treat with Dilaudid, Zofran, bolus fluid.    Labs reviewed, notable for acute renal insufficiency, anemia with hemoglobin 7.2, previously 9.4 approximately 1-year ago, ESR 15, CRP 12.2, urinalysis no evidence of urinary tract infection  CT abdomen pelvis per radiologist interpretation dilatation of small bowel of moderate degree may be due to underlying partial obstruction with areas of air-fluid level, no definite abscess or definite highly inflamed bowel loops noted, possible thick-walled small bowel loop along the left-hand side of the abdomen, with limitations without oral contrast, few nonspecific mildly enlarged mesenteric lymph nodes    Patient informed of findings.  I discussed that he would benefit from a transfusion of PRBC, however only a limited supply of "trauma blood" is available at this facility-reserved for active hemorrhage or hemorrhagic shock, he expresses understanding, I feel he  is appropriate and stable to receive a blood transfusion once hospitalized, with plan for hospitalization for further evaluation and management of abdominal pain and possible  bowel obstruction.  In anticipation of out of network transfer, COVID-19 testing was obtained, patient found to be positive.  Patient's preference is to be admitted at [facility] based on prior care, I discussed with general surgery Dr. Jaynie Collins including patient's history, exam, and work-up findings including CT imaging findings, he recommends medical admission with general surgery and GI consult.  I subsequently discussed with the admitting hospitalist Dr. Marcelyn Bruins at [facility] who accepts patient for admission.  Patient appropriate for admission at this time.    DIAGNOSIS: Abdominal pain, small bowel obstruction, anemia, COVID-19    DISPOSITION: Admission at [place]: Please note this report has been produced using speech recognition software and may contain errors related to that system including errors in grammar, punctuation, and spelling, as well as words and phrases that may be inappropriate.  If there are any questions or concerns please feel free to contact the dictating provider for clarification.    Tomasa Rand, MD  [employer]       Tomasa Rand, MD  11/16/20 [phone]

## 2020-11-15 NOTE — Other (Unsigned)
Patient Acct Nbr: [id number]   Primary AUTH/CERT:   Primary Insurance Company Name: [employer] Insurance Plan name: Orpah Clinton  Primary Insurance Group Number: [id number]  Primary Insurance Plan Type: Health  Primary Insurance Policy Number: [id number]

## 2020-11-15 NOTE — ED Notes (Signed)
Spoke with Clifton Custard with [facility] transfer line.  He states that he will see if there are beds available at [facility] and then will page out general surgery.      Lollie Sails, RN  11/15/20 [phone]

## 2020-11-15 NOTE — ED Notes (Signed)
[facility] called back and transferred call into doctor's office.       Lollie Sails, RN  11/15/20 [phone]

## 2020-11-15 NOTE — ED Notes (Signed)
Patient resting in bed, A&Ox4, respirations even and non-labored.  Provided warm blanket upon request.  Patient states that he's thirsty, ok to have little ice chips per Dr. Herbie [place].  Call light within reach.  Will continue to monitor.       Lollie Sails, RN  11/15/20 [phone]

## 2020-11-15 NOTE — ED Notes (Signed)
Critical lab hemoglobin of 6.2 reported to Dr. Herbie [place] and he verbalizes understanding.      Lollie Sails, RN  11/15/20 [phone]

## 2020-11-15 NOTE — ED Notes (Signed)
Surgery paged via [facility].      Lollie Sails, RN  11/15/20 [phone]

## 2020-11-15 NOTE — ED Triage Notes (Signed)
Patient ambulates to room 6 without any difficulty.  Presents to ED with complaints of LLQ pain that started 2 weeks ago.  He states that he is having a Chron's flare up.  Patient states that he has had 15 episodes of non-bloody diarrhea/day and has vomited today.  Pain has increasingly been getting worse.  Patient has not been able to eat food but has been able to keep water down.

## 2020-11-16 ENCOUNTER — Inpatient Hospital Stay
Admit: 2020-11-16 | Discharge: 2020-11-16 | Disposition: A | Discharge status: Other Acute Inpatient Hospital | Attending: Emergency Medicine

## 2020-11-16 LAB — URINALYSIS
Bacteria, UA: NEGATIVE /HPF
Bilirubin Urine: NEGATIVE mg/dL
Glucose, Ur: NORMAL mg/dL (ref <70)
Ketones, Urine: NEGATIVE mg/dL
LEUKOCYTES, UA: NEGATIVE Leu/uL
Nitrite, Urine: NEGATIVE NA
Occult Blood,Urine: 0.06 mg/dL — AB
Specific Gravity, Urine: 1.015 NA (ref 1.005–1.030)
Squam Epithel, UA: NEGATIVE /HPF (ref 3–5)
Total Protein, Urine: NEGATIVE mg/dL
Urobilinogen, Urine: NORMAL mg/dL (ref 0–1)
Volume: 12 NA
pH, Urine: 5 NA (ref 5.0–8.0)

## 2020-11-16 LAB — CBC
Hematocrit: 22.2 % — ABNORMAL LOW (ref 40.0–52.0)
Hemoglobin: 6.2 g/dL — CL (ref 13.0–18.0)
MCH: 17.3 pg — ABNORMAL LOW (ref 26.0–34.0)
MCHC: 27.9 % — ABNORMAL LOW (ref 32.0–36.0)
MCV: 62 fL — ABNORMAL LOW (ref 80.0–98.0)
MPV: 9.7 fL (ref 7.4–12.4)
Platelets: 247 10*3/uL (ref 140–440)
RBC: 3.58 10*6/uL — ABNORMAL LOW (ref 4.40–5.90)
RDW: 18.2 % — ABNORMAL HIGH (ref 11.5–14.5)
WBC: 6.3 10*3/uL (ref 3.6–10.7)

## 2020-11-16 LAB — COMPREHENSIVE METABOLIC PANEL
ALT: 18 U/L (ref 0–49)
AST: 27 U/L (ref 15–46)
Albumin,Serum: 3.5 g/dL (ref 3.5–5.0)
Alkaline Phosphatase: 36 U/L — ABNORMAL LOW (ref 38–126)
Anion Gap: 7 mmol/L (ref 3–13)
BUN: 19 mg/dL — ABNORMAL HIGH (ref 7–17)
CO2: 18 mmol/L — ABNORMAL LOW (ref 22–30)
Calcium: 8.5 mg/dL (ref 8.4–10.4)
Chloride: 111 mmol/L — ABNORMAL HIGH (ref 98–107)
Creatinine: 1.42 mg/dL — ABNORMAL HIGH (ref 0.52–1.25)
EGFR IF NonAfrican American: 55.2 mL/min — AB (ref >60)
Glucose: 96 mg/dL (ref 70–100)
Potassium: 4.2 mmol/L (ref 3.5–5.1)
Sodium: 137 mmol/L (ref 135–145)
Total Bilirubin: 0.3 mg/dL (ref 0.2–1.3)
Total Protein: 6.2 g/dL — ABNORMAL LOW (ref 6.3–8.2)
eGFR African American: 64 mL/min (ref >60)

## 2020-11-16 LAB — COVID-19, FLU A/B, AND RSV COMBO
Influenza A by PCR: NOT DETECTED
Influenza B by PCR: NOT DETECTED
RSV PCR: NOT DETECTED
SARS-CoV-2: DETECTED — AB

## 2020-11-16 LAB — LACTIC ACID: Lactic Acid: 1.2 mmol/L (ref 0.7–2.0)

## 2020-11-16 LAB — LIPASE: Lipase: 137 U/L (ref 23–300)

## 2020-11-16 LAB — PROTIME-INR
INR: 1 NA (ref 0.9–1.1)
Protime: 10.8 s (ref 9.0–12.0)

## 2020-11-16 LAB — C-REACTIVE PROTEIN: CRP: 12.2 mg/L — ABNORMAL HIGH (ref 0.0–9.9)

## 2020-11-16 LAB — SEDIMENTATION RATE: Sed Rate: 15 mm/h — ABNORMAL HIGH (ref 0–10)

## 2020-11-16 MED ORDER — ACETAMINOPHEN 500 MG PO TABS
500 MG | Freq: Once | ORAL | Status: AC
Start: 2020-11-16 — End: 2020-11-16
  Administered 2020-11-16: 05:00:00 1000 mg via ORAL

## 2020-11-16 MED ORDER — SODIUM CHLORIDE 0.9 % IV BOLUS
0.9 % | Freq: Once | INTRAVENOUS | Status: AC
Start: 2020-11-16 — End: 2020-11-15
  Administered 2020-11-16: 01:00:00 1000 mL via INTRAVENOUS

## 2020-11-16 MED ORDER — IOPAMIDOL 76 % IV SOLN
76 % | Freq: Once | INTRAVENOUS | Status: AC | PRN
Start: 2020-11-16 — End: 2020-11-15
  Administered 2020-11-16: 02:00:00 75 mL via INTRAVENOUS

## 2020-11-16 MED ORDER — HYDROMORPHONE HCL 1 MG/ML IJ SOLN
1 MG/ML | Freq: Once | INTRAMUSCULAR | Status: AC
Start: 2020-11-16 — End: 2020-11-15
  Administered 2020-11-16: 01:00:00 1 mg via INTRAVENOUS

## 2020-11-16 MED ORDER — HYDROMORPHONE HCL 1 MG/ML IJ SOLN
1 MG/ML | Freq: Once | INTRAMUSCULAR | Status: AC
Start: 2020-11-16 — End: 2020-11-16
  Administered 2020-11-16: 05:00:00 0.5 mg via INTRAVENOUS

## 2020-11-16 MED ORDER — HYDROMORPHONE HCL 1 MG/ML IJ SOLN
1 MG/ML | Freq: Once | INTRAMUSCULAR | Status: AC
Start: 2020-11-16 — End: 2020-11-15
  Administered 2020-11-16: 03:00:00 1 mg via INTRAVENOUS

## 2020-11-16 MED ORDER — ONDANSETRON HCL 4 MG/2ML IJ SOLN
4 MG/2ML | Freq: Once | INTRAMUSCULAR | Status: AC
Start: 2020-11-16 — End: 2020-11-15
  Administered 2020-11-16: 01:00:00 4 mg via INTRAVENOUS

## 2020-11-16 MED FILL — DILAUDID 1 MG/ML IJ SOLN: 1 mg/mL | INTRAMUSCULAR | Qty: 1

## 2020-11-16 MED FILL — TYLENOL EXTRA STRENGTH 500 MG PO TABS: 500 mg | ORAL | Qty: 2

## 2020-11-16 MED FILL — ONDANSETRON HCL 4 MG/2ML IJ SOLN: 4 MG/2ML | INTRAMUSCULAR | Qty: 2

## 2020-11-16 MED FILL — DILAUDID 1 MG/ML IJ SOLN: 1 mg/mL | INTRAMUSCULAR | Qty: 0.5

## 2020-11-16 NOTE — ED Notes (Signed)
Report called to [place], [facility] fund[facility].  Bed changed to 217-2.      Lollie Sails, RN  11/16/20 [phone]

## 2020-11-16 NOTE — ED Notes (Addendum)
[facility] arrived to transport patient to [facility].  Patient is A&Ox4, respirations even and non-labored, and does not appear to be in any acute distress.  Port intact.  Patient transported with 1 bag of belongings, printed chart, and disc of CT abdomen.       Lollie Sails, RN  11/16/20 [phone]

## 2020-11-16 NOTE — ED Notes (Signed)
[facility] outsourced and was informed they're on their way.      Lollie Sails, RN  11/16/20 [phone]
